# Patient Record
Sex: Female | Born: 1970 | Race: White | Hispanic: No | Marital: Married | Smoking: Current every day smoker
Health system: Southern US, Community
[De-identification: ages and names within clinical notes are randomized; demographics above are authoritative.]

## PROBLEM LIST (undated history)

## (undated) DIAGNOSIS — G039 Meningitis, unspecified: Secondary | ICD-10-CM

## (undated) DIAGNOSIS — I10 Essential (primary) hypertension: Secondary | ICD-10-CM

## (undated) DIAGNOSIS — M1990 Unspecified osteoarthritis, unspecified site: Secondary | ICD-10-CM

## (undated) DIAGNOSIS — K219 Gastro-esophageal reflux disease without esophagitis: Secondary | ICD-10-CM

## (undated) DIAGNOSIS — F419 Anxiety disorder, unspecified: Secondary | ICD-10-CM

## (undated) DIAGNOSIS — M62830 Muscle spasm of back: Secondary | ICD-10-CM

## (undated) DIAGNOSIS — F32A Depression, unspecified: Secondary | ICD-10-CM

## (undated) DIAGNOSIS — T7840XA Allergy, unspecified, initial encounter: Secondary | ICD-10-CM

## (undated) DIAGNOSIS — C801 Malignant (primary) neoplasm, unspecified: Secondary | ICD-10-CM

## (undated) DIAGNOSIS — R569 Unspecified convulsions: Secondary | ICD-10-CM

## (undated) HISTORY — DX: Unspecified osteoarthritis, unspecified site: M19.90

## (undated) HISTORY — DX: Depression, unspecified: F32.A

## (undated) HISTORY — DX: Meningitis, unspecified: G03.9

## (undated) HISTORY — DX: Essential (primary) hypertension: I10

## (undated) HISTORY — PX: OTHER SURGICAL HISTORY: SHX169

## (undated) HISTORY — DX: Malignant (primary) neoplasm, unspecified: C80.1

## (undated) HISTORY — PX: LAPAROSCOPIC HYSTERECTOMY: SHX1926

## (undated) HISTORY — PX: SHUNT REPLACEMENT: SHX5403

## (undated) HISTORY — DX: Anxiety disorder, unspecified: F41.9

## (undated) HISTORY — DX: Muscle spasm of back: M62.830

## (undated) HISTORY — DX: Gastro-esophageal reflux disease without esophagitis: K21.9

## (undated) HISTORY — DX: Allergy, unspecified, initial encounter: T78.40XA

## (undated) HISTORY — DX: Unspecified convulsions: R56.9

## (undated) HISTORY — PX: SHUNT REMOVAL: SHX342

---
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
## 1999-05-04 ENCOUNTER — Encounter: Payer: Self-pay | Admitting: Endocrinology

## 1999-05-04 ENCOUNTER — Encounter: Admission: RE | Admit: 1999-05-04 | Discharge: 1999-05-04 | Payer: Self-pay | Admitting: Endocrinology

## 1999-08-02 ENCOUNTER — Emergency Department (HOSPITAL_COMMUNITY): Admission: EM | Admit: 1999-08-02 | Discharge: 1999-08-02 | Payer: Self-pay | Admitting: Emergency Medicine

## 1999-08-02 ENCOUNTER — Encounter: Payer: Self-pay | Admitting: Emergency Medicine

## 1999-10-01 ENCOUNTER — Encounter: Admission: RE | Admit: 1999-10-01 | Discharge: 1999-10-18 | Payer: Self-pay | Admitting: Orthopedic Surgery

## 2000-07-25 ENCOUNTER — Other Ambulatory Visit: Admission: RE | Admit: 2000-07-25 | Discharge: 2000-07-25 | Payer: Self-pay | Admitting: Obstetrics and Gynecology

## 2000-07-25 ENCOUNTER — Encounter (INDEPENDENT_AMBULATORY_CARE_PROVIDER_SITE_OTHER): Payer: Self-pay

## 2000-09-26 ENCOUNTER — Encounter (INDEPENDENT_AMBULATORY_CARE_PROVIDER_SITE_OTHER): Payer: Self-pay | Admitting: Specialist

## 2000-09-26 ENCOUNTER — Ambulatory Visit (HOSPITAL_COMMUNITY): Admission: RE | Admit: 2000-09-26 | Discharge: 2000-09-26 | Payer: Self-pay | Admitting: Obstetrics and Gynecology

## 2000-12-08 ENCOUNTER — Inpatient Hospital Stay (HOSPITAL_COMMUNITY): Admission: RE | Admit: 2000-12-08 | Discharge: 2000-12-10 | Payer: Self-pay | Admitting: Obstetrics and Gynecology

## 2000-12-08 ENCOUNTER — Encounter (INDEPENDENT_AMBULATORY_CARE_PROVIDER_SITE_OTHER): Payer: Self-pay | Admitting: Specialist

## 2003-02-23 ENCOUNTER — Ambulatory Visit (HOSPITAL_COMMUNITY): Admission: RE | Admit: 2003-02-23 | Discharge: 2003-02-23 | Payer: Self-pay | Admitting: Neurological Surgery

## 2003-08-16 ENCOUNTER — Encounter: Admission: RE | Admit: 2003-08-16 | Discharge: 2003-08-16 | Payer: Self-pay | Admitting: Endocrinology

## 2004-05-11 ENCOUNTER — Encounter: Admission: RE | Admit: 2004-05-11 | Discharge: 2004-05-11 | Payer: Self-pay | Admitting: Family Medicine

## 2007-02-25 ENCOUNTER — Encounter: Admission: RE | Admit: 2007-02-25 | Discharge: 2007-02-25 | Payer: Self-pay | Admitting: Family Medicine

## 2007-07-24 ENCOUNTER — Inpatient Hospital Stay (HOSPITAL_COMMUNITY): Admission: AD | Admit: 2007-07-24 | Discharge: 2007-07-31 | Payer: Self-pay | Admitting: Neurological Surgery

## 2007-07-25 ENCOUNTER — Ambulatory Visit: Payer: Self-pay | Admitting: Infectious Diseases

## 2007-08-11 ENCOUNTER — Emergency Department (HOSPITAL_COMMUNITY): Admission: EM | Admit: 2007-08-11 | Discharge: 2007-08-12 | Payer: Self-pay | Admitting: Emergency Medicine

## 2007-09-17 ENCOUNTER — Inpatient Hospital Stay (HOSPITAL_COMMUNITY): Admission: EM | Admit: 2007-09-17 | Discharge: 2007-09-19 | Payer: Self-pay | Admitting: Emergency Medicine

## 2008-04-29 ENCOUNTER — Encounter: Admission: RE | Admit: 2008-04-29 | Discharge: 2008-04-29 | Payer: Self-pay | Admitting: Family Medicine

## 2009-04-21 IMAGING — CT CT HEAD W/O CM
1 series · 16 of 30 positions shown, 20 images · non-contrast
Comparison: 07/27/2007 and 07/24/2007

CLINICAL DATA: Shunt malfunction.  Follow-up.

CT HEAD WITHOUT CONTRAST
TECHNIQUE: Contiguous axial images were obtained from the base of
the skull through the vertex without contrast.

[Series 2: head routine 4.8 h37s · axial · 0.43mm/px · z∈[-108,+25]mm · 16 of 30 slices shown, 20 images]
[im 2/30  brain]
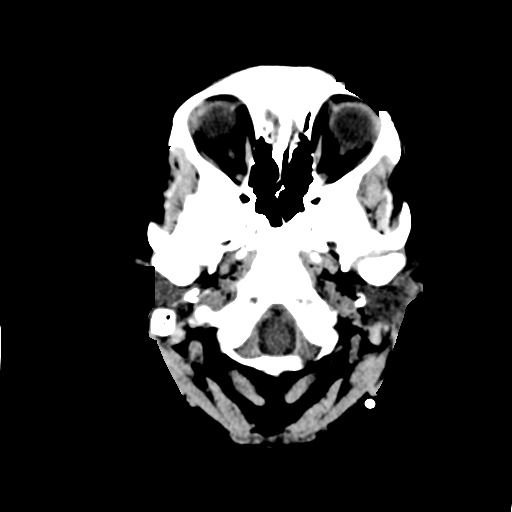
[im 2/30  bone]
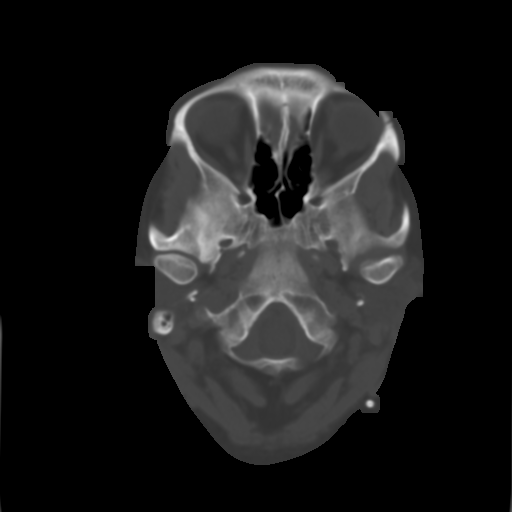
[im 4/30  brain]
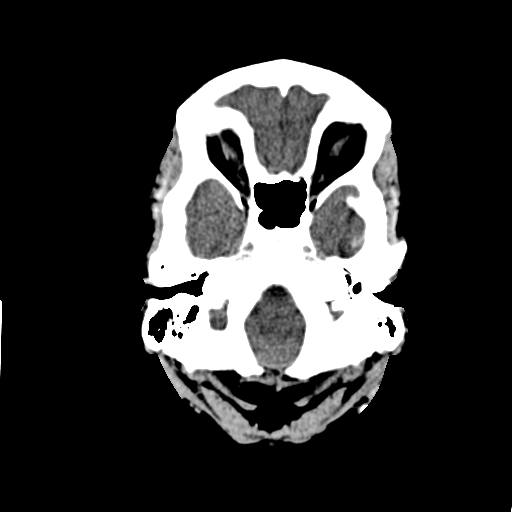
[im 6/30  brain]
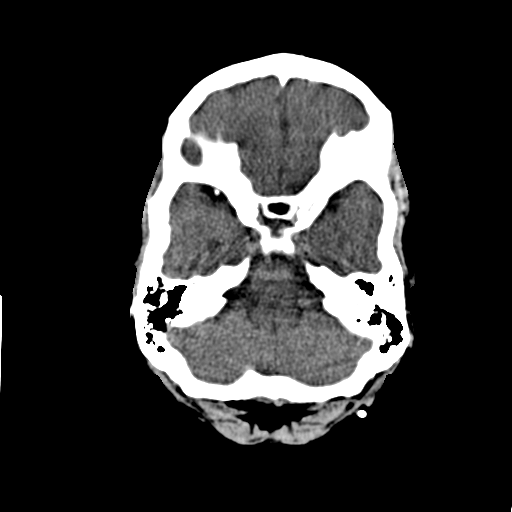
[im 8/30  brain]
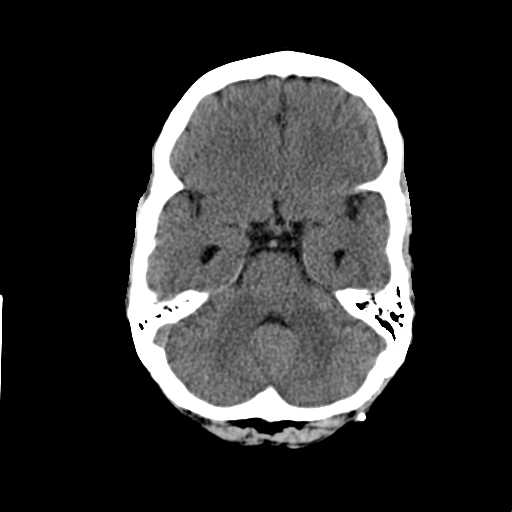
[im 9/30  brain]
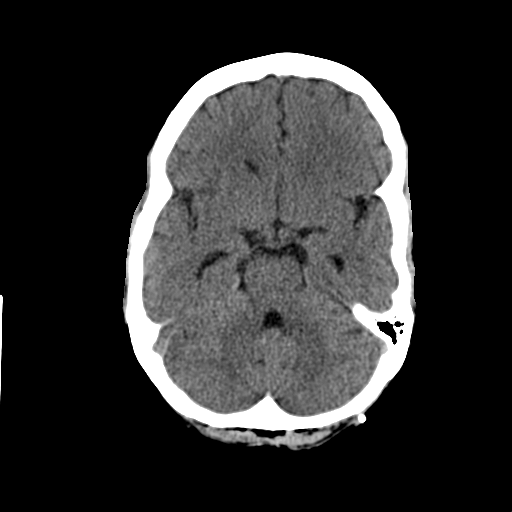
[im 9/30  bone]
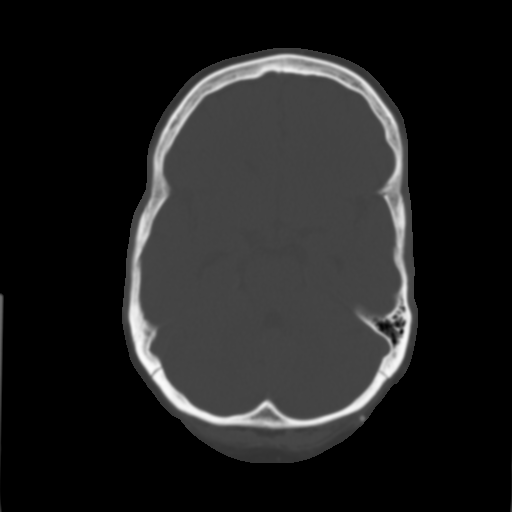
[im 11/30  brain]
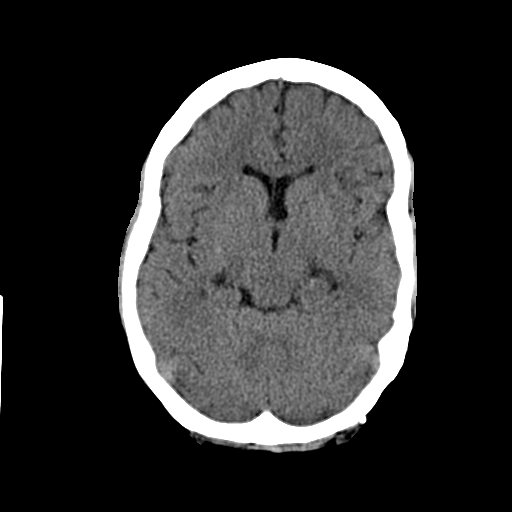
[im 13/30  brain]
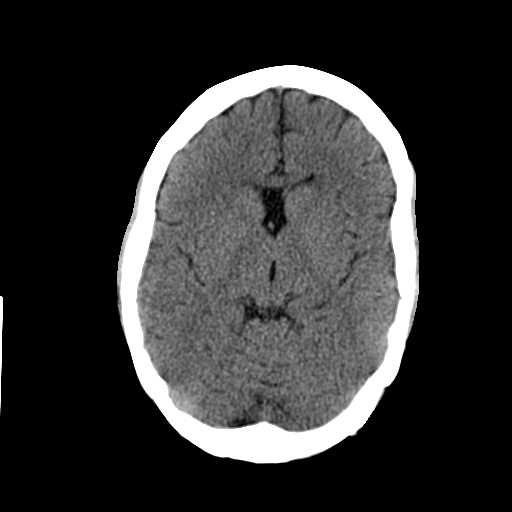
[im 15/30  brain]
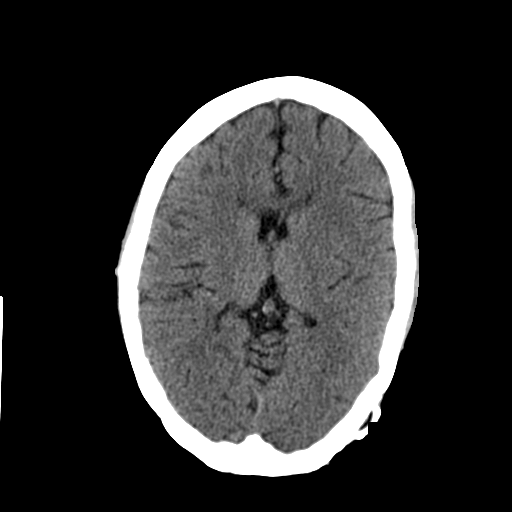
[im 16/30  brain]
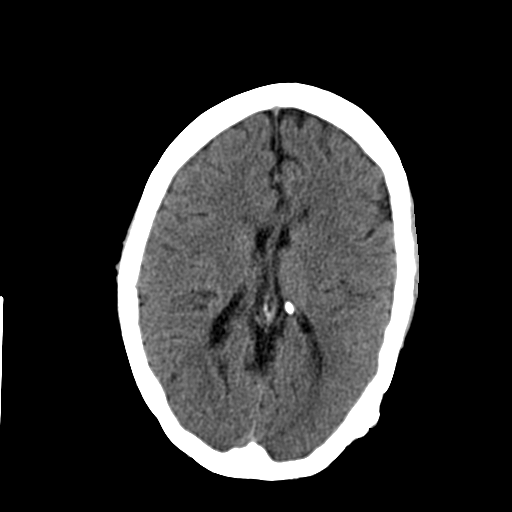
[im 16/30  bone]
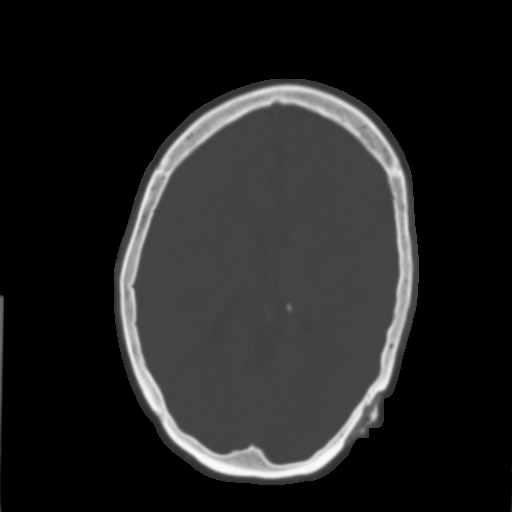
[im 18/30  brain]
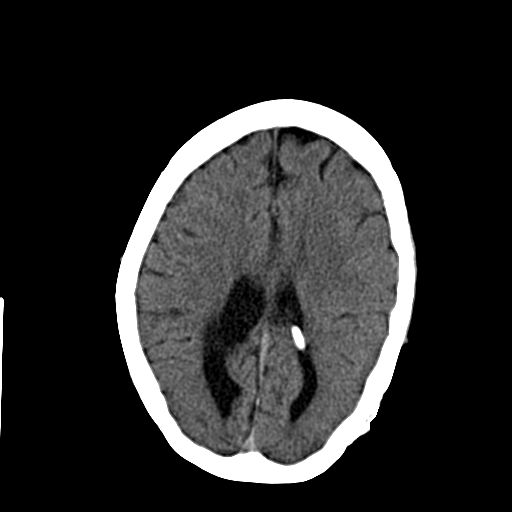
[im 20/30  brain]
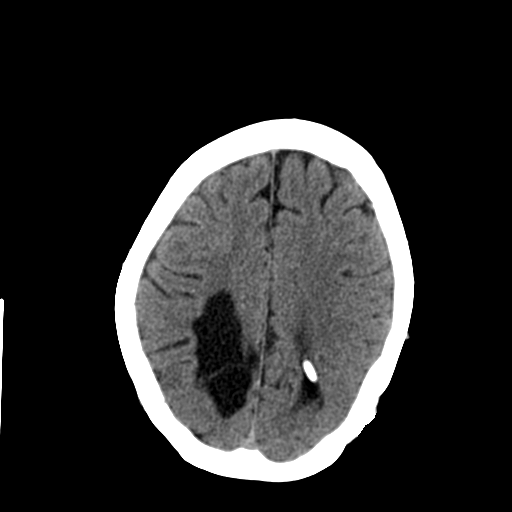
[im 22/30  brain]
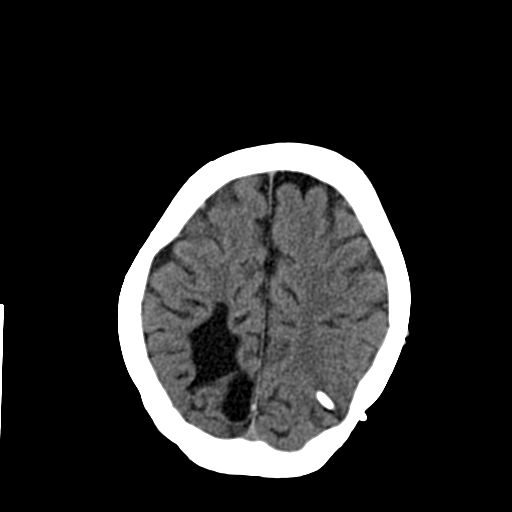
[im 23/30  brain]
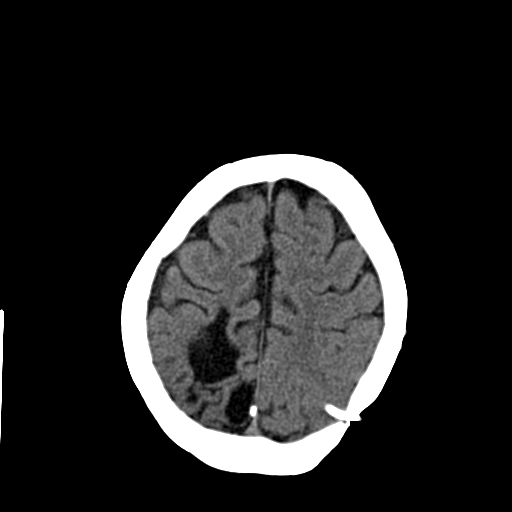
[im 23/30  bone]
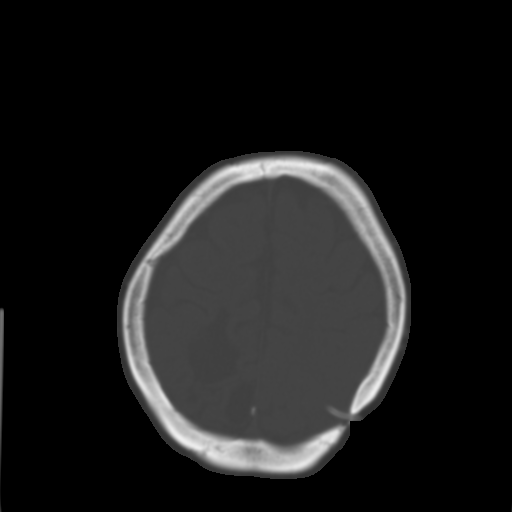
[im 25/30  brain]
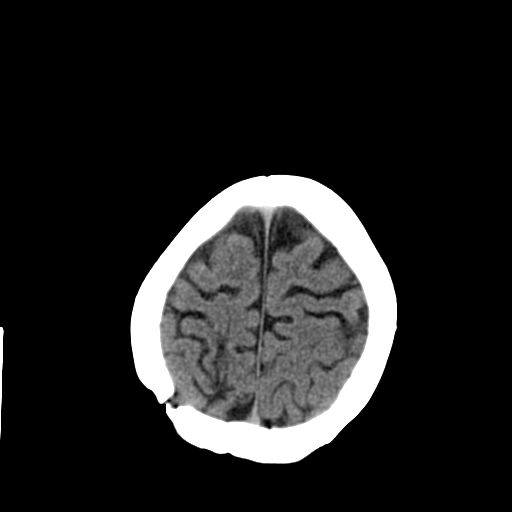
[im 27/30  brain]
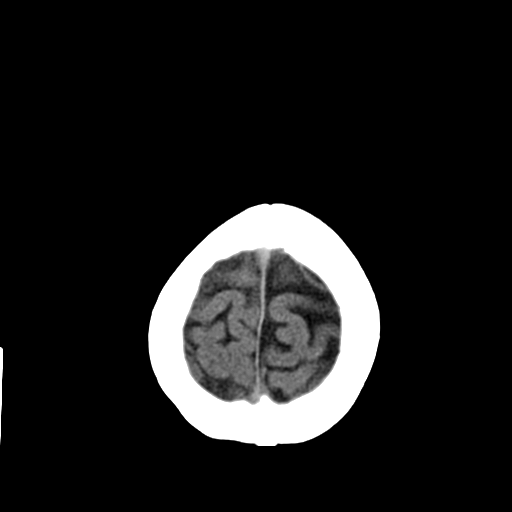
[im 29/30  brain]
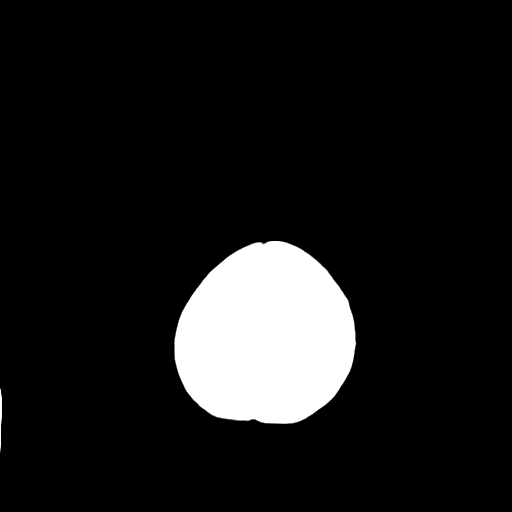

[16 of 30 positions shown; findings below may reference images not displayed]

FINDINGS: The patient has a left parietal shunt catheter.  The
ventricles are stable in size since most recent exam.  Large area
of encephalomalacia is again noted within the right parietal lobe.
There is no intra or extra-axial fluid collection or mass.  No
evidence for intracranial hemorrhage.  No CT evidence for acute
infarction.

Bone windows are otherwise unremarkable.
IMPRESSION: Stable size of lateral ventricles bilaterally.  No evidence for
hemorrhage.

## 2009-04-24 IMAGING — CT CT HEAD W/O CM
1 of 2 series · 13 of 30 positions shown, 17 images · non-contrast
Comparison: 07/28/2007 head CT

CLINICAL DATA: Shunt malfunction, removal

CT HEAD WITHOUT CONTRAST
TECHNIQUE: Contiguous axial images were obtained from the base of
the skull through the vertex without contrast.

[Series 2: brain · axial · 0.47mm/px · z∈[+122,+254]mm · 13 of 32 slices shown, 17 images]
[im 3/32  brain]
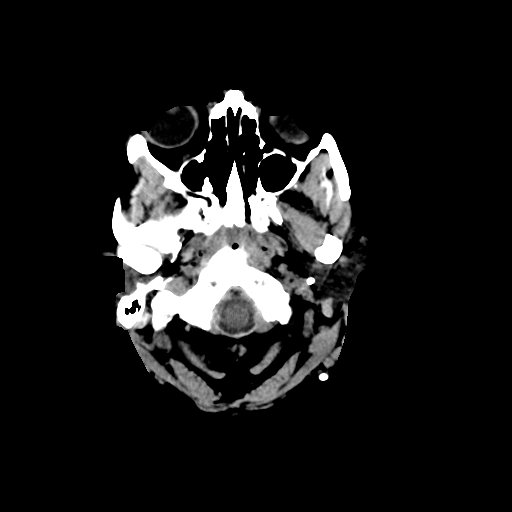
[im 3/32  bone]
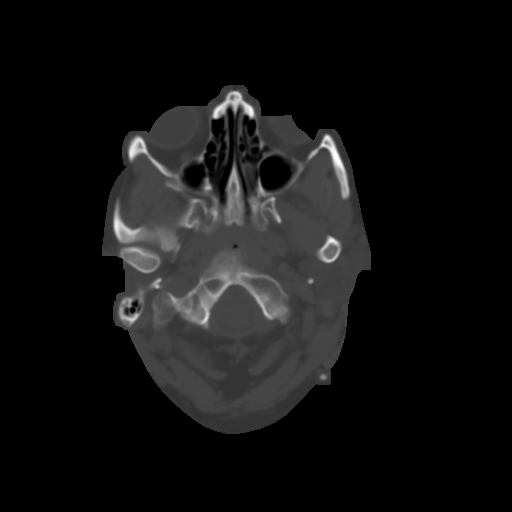
[im 5/32  brain]
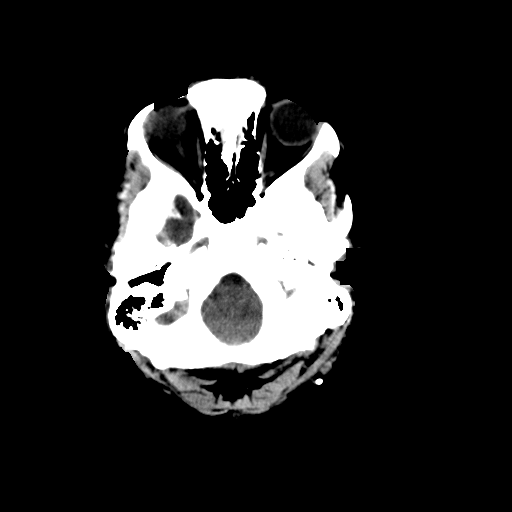
[im 7/32  brain]
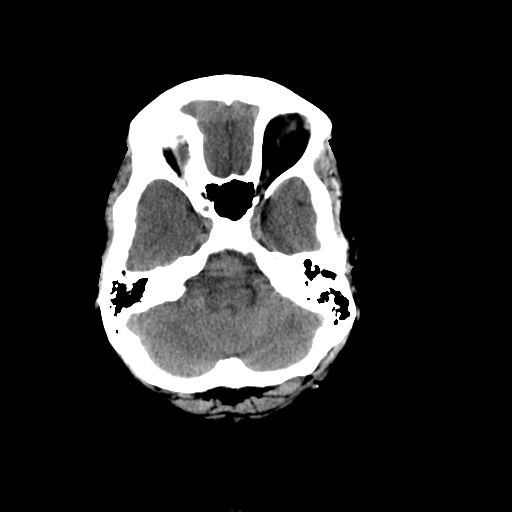
[im 9/32  brain]
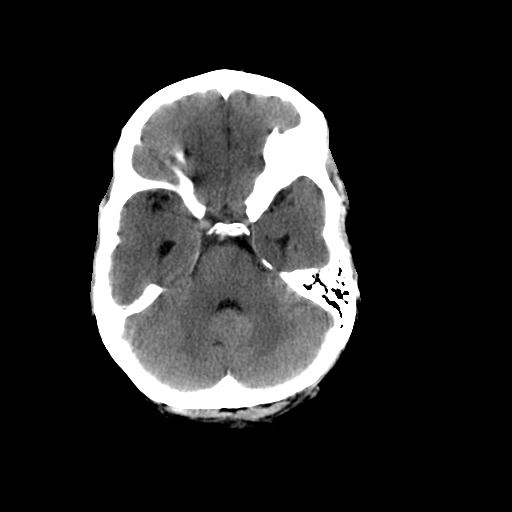
[im 12/32  brain]
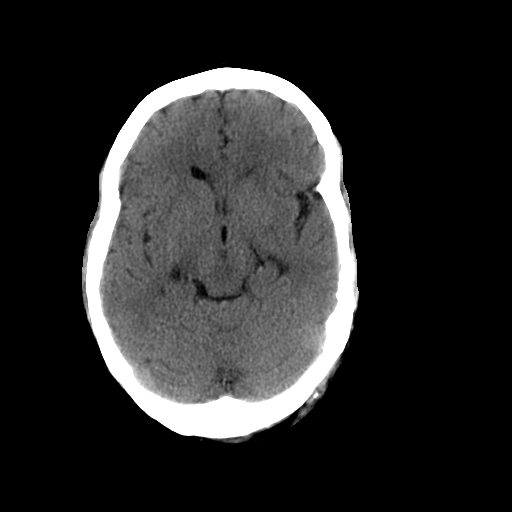
[im 12/32  bone]
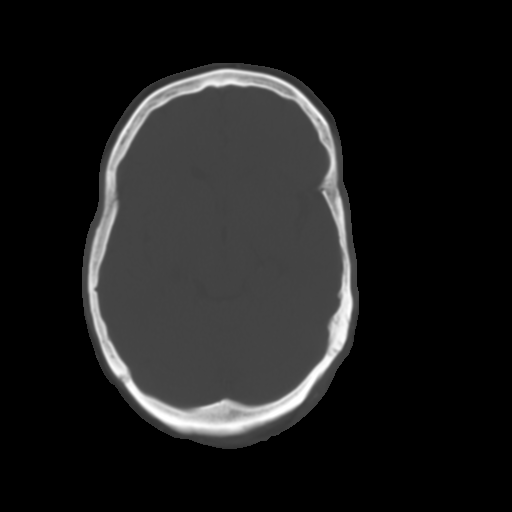
[im 14/32  brain]
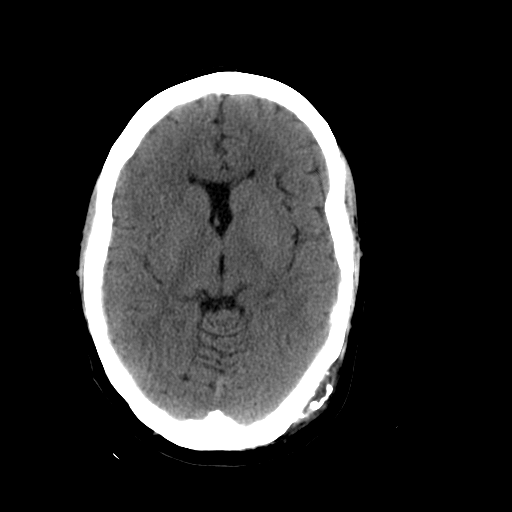
[im 16/32  brain]
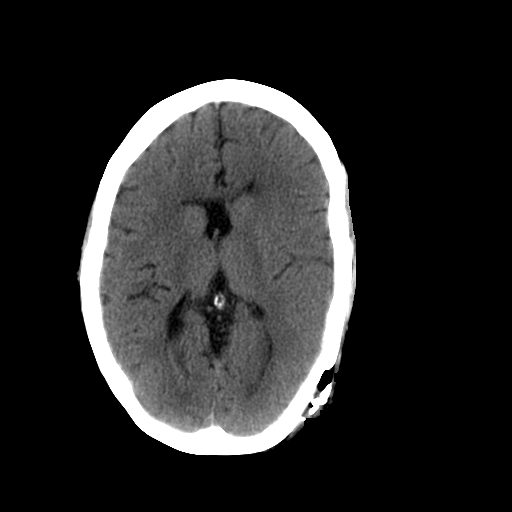
[im 18/32  brain]
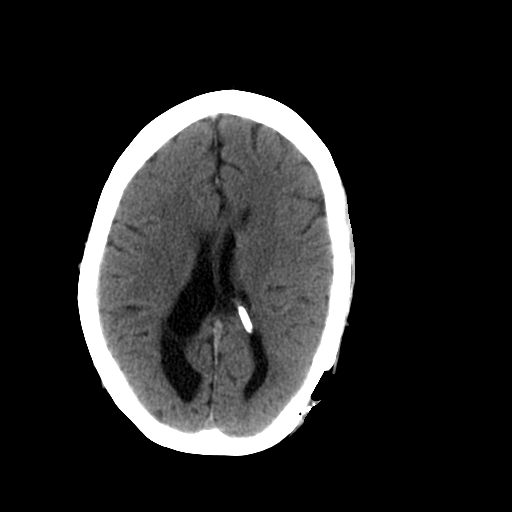
[im 20/32  brain]
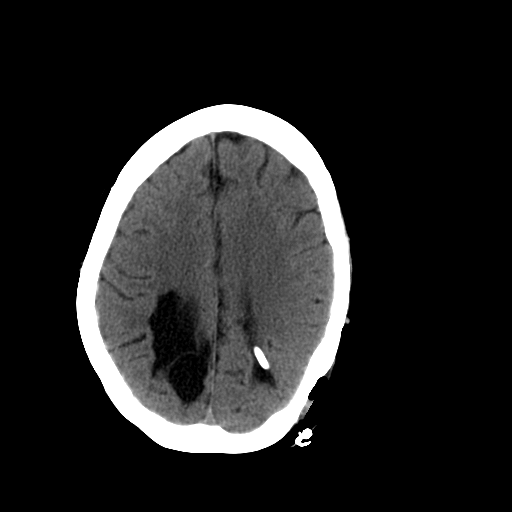
[im 20/32  bone]
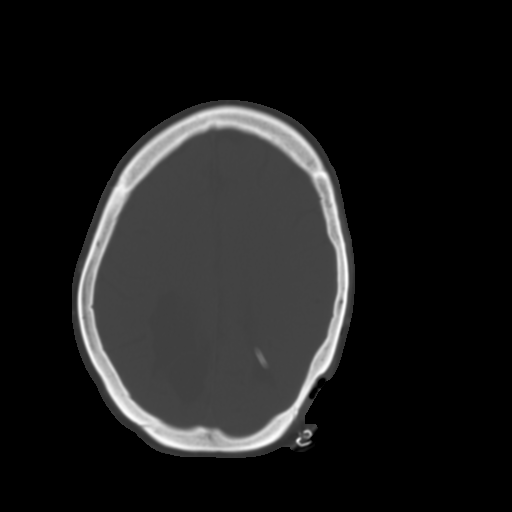
[im 23/32  brain]
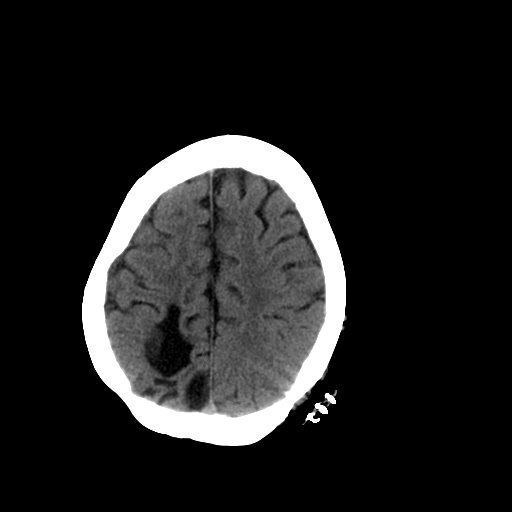
[im 25/32  brain]
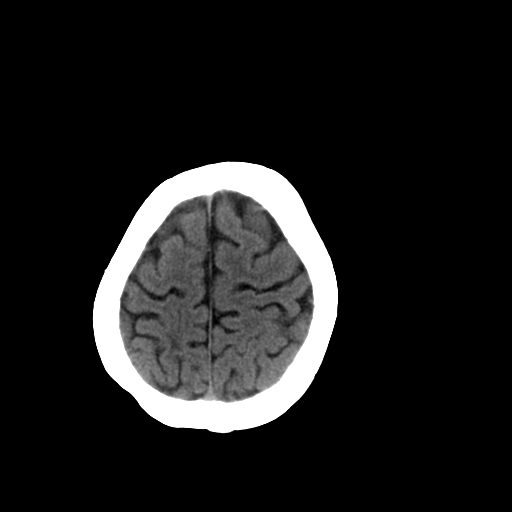
[im 27/32  brain]
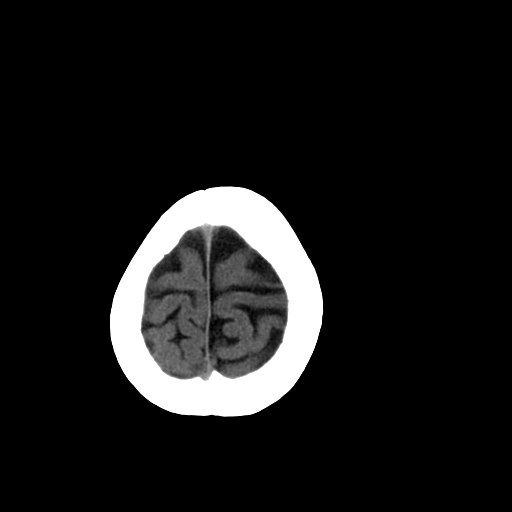
[im 29/32  brain]
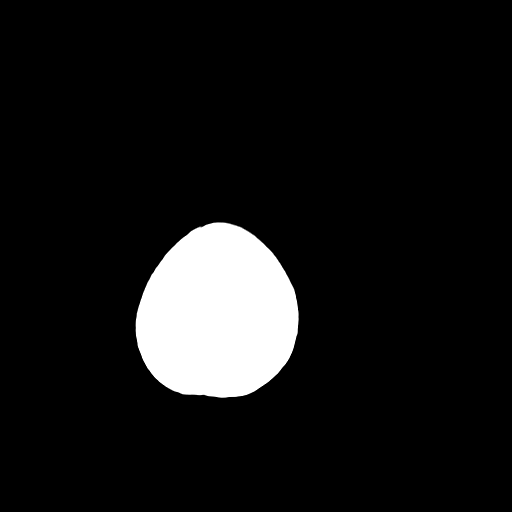
[im 29/32  bone]
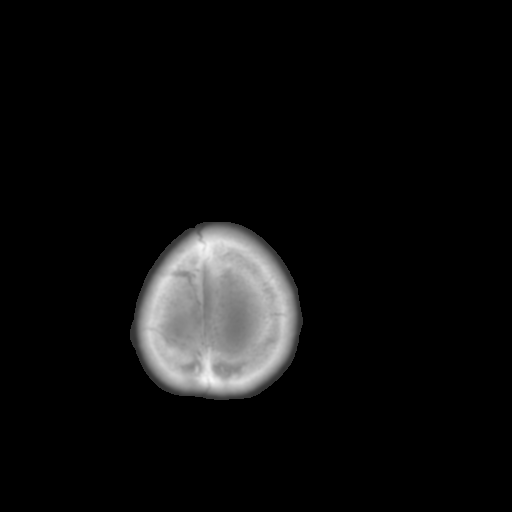

[13 of 30 positions shown; findings below may reference images not displayed]

FINDINGS: A ventricular shunt catheter is visualized via left
parietal occipital approach.  There is stable relative prominence
of the right lateral ventricle with adjacent right temporal
encephalomalacia.  No midline shift.  No acute hemorrhage, acute
infarct, or mass lesion.  The postsurgical changes and staples are
noted over the left parietal occipital skull.  Right parietal
craniotomy defect again noted.
IMPRESSION: No acute intracranial finding after replacement of left parietal
approach shunt catheter.

## 2009-07-31 ENCOUNTER — Emergency Department (HOSPITAL_COMMUNITY): Admission: EM | Admit: 2009-07-31 | Discharge: 2009-07-31 | Payer: Self-pay | Admitting: Emergency Medicine

## 2009-08-09 ENCOUNTER — Ambulatory Visit (HOSPITAL_BASED_OUTPATIENT_CLINIC_OR_DEPARTMENT_OTHER): Admission: RE | Admit: 2009-08-09 | Discharge: 2009-08-09 | Payer: Self-pay | Admitting: Orthopedic Surgery

## 2010-05-28 LAB — POCT HEMOGLOBIN-HEMACUE: Hemoglobin: 14.9 g/dL (ref 12.0–15.0)

## 2010-07-24 NOTE — H&P (Signed)
NAMEANSLEY, MANGIAPANE              ACCOUNT NO.:  [id number]   MEDICAL RECORD NO.:  [id number]          PATIENT TYPE:  INP   LOCATION:  3010                         FACILITY:  [facility]   PHYSICIAN:  Stefani Dama, M.D.  DATE OF BIRTH:  Dec 30, 1970   DATE OF ADMISSION:  07/24/2007  DATE OF DISCHARGE:                              HISTORY & PHYSICAL   ADMISSION DIAGNOSIS:  Shunt malfunction, possible shunt infection.   HISTORY OF PRESENT ILLNESS:  Christy Price is a 40 year old individual  whom I followed for a number of years.  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 Milinda Cave.  She had a revision some 25  years ago by Dr. Darreld Mclean, and has apparently been shunt dependent  since.  The patient has been doing well, functioning at her job at [employer] where she has been pleasant and very diligent in her work.  She has developed a pustule and tenderness over the left  parietooccipital region directly over the shunt.  Her mother notes that  she always seems to be palpating and pumping on the shunt reservoir.  This has become increasingly tender, and she woke her mother last night  to complain about the pain.  She was seen at her mother's place of work  in the ICU this morning and notes that she has a considerable amount of  tenderness in her left parietooccipital region.  She also has been  having increasing back pain, and she has been anorexic and feeling  rather anxious.   PAST MEDICAL HISTORY:  Her past medical history reveals that she has  otherwise been fairly healthy.  She has been able to function fairly  well from behavioral standpoint and signs very readily.   CURRENT MEDICATIONS:  None.   PHYSICAL EXAMINATION:  At the current time, she was alert and oriented.  She has a tender red pustule with a very slight  center of white area of  skin necrosis directly over the shunt.  This measures approximately 2 cm  in the width and is elevated approximately 4-5 mm distal to this.  The  shunt is tender palpation.  Palpation along the shunt in the region of  the neck feels minimal tenderness.  Her level of consciousness is good.  The cranial nerve examination is within limits of normal except for the  fact that she has total hearing loss.  Her abdomen is soft.  Bowel  sounds are positive.  No masses are noted.  Back is tender to palpation.  There is no percussion tenderness.  Her general physical exam reveals  lungs are clear.  The heart has regular rate and rhythm.  No murmurs  noted.  Extremities reveal no cyanosis, clubbing or edema.   IMPRESSION:  At this point, I suspect she has a shunt failure.  I would  advice that this would be revised; however, we need to order some  initial laboratories and CT scan to evaluate this  further.  She has been  afebrile.  We will monitor the temperatures while here in the hospital.   PLAN:  The plan will be to perform a revision depending on the findings  on the scan.  If the patient is shunt dependent, she may need to have  externalization of the shunt first while we treat the wound locally.      Stefani Dama, M.D.  Electronically Signed     HJE/MEDQ  D:  07/24/2007  T:  07/25/2007  Job:  [id number]

## 2010-07-24 NOTE — Discharge Summary (Signed)
Christy Price, Christy Price              ACCOUNT NO.:  [id number]   MEDICAL RECORD NO.:  [id number]          PATIENT TYPE:  INP   LOCATION:  3017                         FACILITY:  [facility]   PHYSICIAN:  Beckey Rutter, MD  DATE OF BIRTH:  1970/05/28   DATE OF ADMISSION:  09/16/2007  DATE OF DISCHARGE:  09/19/2007                               DISCHARGE SUMMARY   NEUROSURGEON:  Payton Doughty, MD.   CHIEF COMPLAINT AND HISTORY OF PRESENT ILLNESS:  This is a very pleasant  40 year old Caucasian female with past medical history significant for  deafness and hydrocephalus, history of meningitis status post VP shunt  removal on May 2009 and replacement.  She presented with abdominal pain;  intermittent, not radiated.   HOSPITAL COURSE:  During the hospital course, the patient was started on  antibiotic Cipro for suspicion of infection.  She also have neurosurgery  evaluated her and the concluded VP shunt is working fine, and this  abdominal pain is unlikely to be secondary to the shunt.   The patient's diet was advanced and currently the patient is able to  tolerate regular diet.  She has a very minimal right quadrant pain  currently and the reason for this abdominal pain could not be determined  beyond that; nevertheless, the patient has ovarian cyst on the left  side.  She has the shunt and the possibility of mild colitis is present,  as well as the possibility of appendicitis.  But again, the CAT scan was  not conclusive and did not indicate any entrapped abdominal acute  pathology.   DISCHARGE MEDICATIONS:  1. Tylenol p.r.n.  2. Cipro 500 mg b.i.d. for 5 more days.   DISCHARGE DIAGNOSES:  1. Abdominal pain, nausea, and vomiting felt secondary to viral versus      ovarian problems.  2. Ventriculoperitoneal hydrocephalus dependent.  3. Deafness.   DISCHARGE PLAN:  The patient was discharged today to follow up with her  GYN.  The patient will be discharged on Cipro, and she was  instructed to  come back to the emergency department if the symptoms recur.  The  discharge plan is also discussed with, [name], her mom, who is a  nurse herself here with [place] in [facility] hospital.      Beckey Rutter, MD  Electronically Signed     EME/MEDQ  D:  09/19/2007  T:  09/20/2007  Job:  [id number]

## 2010-07-24 NOTE — Discharge Summary (Signed)
[name], Price              ACCOUNT NO.:  [id number]   MEDICAL RECORD NO.:  [id number]          PATIENT TYPE:  INP   LOCATION:  3020                         FACILITY:  [facility]   PHYSICIAN:  Stefani Dama, M.D.  DATE OF BIRTH:  1970/05/22   DATE OF ADMISSION:  07/24/2007  DATE OF DISCHARGE:                               DISCHARGE SUMMARY   ADMITTING DIAGNOSES:  1. Ventriculoperitoneal shunt infection.  2. Scalp infection.   DISCHARGE AND FINAL DIAGNOSES:  1. Possible ventriculoperitoneal shunt infection.  2. Scalp infection.   MAJOR OPERATIONS:  1. Externalization of ventriculoperitoneal shunt on Jul 25, 2007.  2. Removal of ventriculoperitoneal shunt on Jul 30, 2007.   HOSPITAL COURSE:  Christy Price is a 40 year old individual who has had  shunt-dependent hydrocephalus since an infant.  At that time, she  suffered a meningitis, developed hydrocephalus, was treated with  ventriculostomy, ultimately ventriculoperitoneal shunting.  It was  originally placed by Dr. Ardean Larsen.  She has had the same shunt in  place since 40 year of age and had 1 extension performed approximately 20  years ago by Dr. Darreld Mclean.  The shunt has continually worked fairly  well, although she was taught to pump the shunt whenever she got a  headache.  CT scan showed that she has slit ventricles.  On the day of  admission, she developed a pustule overlying the shunt pump, and this  was a rather large 1.5-cm pustule with a central necrotic area and the  whitehead.  She had tenderness along the path of the shunt.  She did not  complain of any headache.  It was suspected that she may have an  evolving shunt infection.  She was hospitalized, and on the next day,  she was taken to the operating room where she underwent externalization  of the distal portion of the catheter.  It drained fluid briskly, and  the pustule itself was debrided.  Cultures yielded coagulase-negative  staphylococcus, and  she was started on vancomycin.  She has been  maintained on this antibiotic during the entire week.  She has not ever  had a fever.  With the externalization, we raised the pressure gradually  and she decreased the amount of fluid that she drank, and ultimately,  she could tolerate being clamped.  The shunt remained clamped during the  entirety of the week.  The ventricular size was noted to enlarge  modestly on the first CT scan after clamping and enlarged slightly more  on the next day.  It was felt, however, that she would tolerate clamping  of the shunt and shunt removal; and on Wednesday Jul 29, 2007, she was  taken to the operating room where she underwent removal of the portion  of the shunt distal to the ventricular catheter.  The ventricular  catheter itself was not removed as it had been implanted in this place  for the last 38 years.  The proximal tube was tied off.  Postoperatively, the patient did reasonably well.  She complained of  headache on the second postoperative day.  A repeat CT scan  was  performed which demonstrated that the ventricular size remained normal.  Her headache seemed to resolve spontaneously.  Her incisions have been  clean and dry, and at  this time, she is undergoing discontinuation of the vancomycin and will  be discharged home.  She will be seen in the office for further followup  in approximately a week time for staple removal.   CONDITION ON DISCHARGE:  Stable.      Stefani Dama, M.D.  Electronically Signed     HJE/MEDQ  D:  07/31/2007  T:  08/01/2007  Job:  [id number]

## 2010-07-24 NOTE — Op Note (Signed)
NAMESONDOS, WOLFMAN              ACCOUNT NO.:  [id number]   MEDICAL RECORD NO.:  [id number]          PATIENT TYPE:  INP   LOCATION:  3103                         FACILITY:  [facility]   PHYSICIAN:  Stefani Dama, M.D.  DATE OF BIRTH:  [birth year]   DATE OF PROCEDURE:  07/24/2007  DATE OF DISCHARGE:                               OPERATIVE REPORT   PREOPERATIVE DIAGNOSIS:  Ventriculoperitoneal shunt malfunction, status  post externalization.   POSTOPERATIVE DIAGNOSIS:  Ventriculoperitoneal shunt malfunction, status  post externalization.   PROCEDURES:  Removal of a Ventriculoperitoneal shunt.   SURGEON:  Stefani Dama, MD   ASSISTANT:  Coletta Memos, MD   ANESTHESIA:  General endotracheal.   INDICATIONS:  Christy Price is a 40 year old individual who has had  shunt malfunction, noted with an infection over the proximal portion of  the shunt near the pump.  Her shunt had been externalized and the shunt  is an excess of 42 years old.  Fluid withdrawn from it was clear and did  not have any signs of infection.  The site of the erythema around the  proximal end was a pustule, yielded coag negative staph.  The patient  was placed on antibiotics and after debridement of that area of her  scalp, she defervesced rapidly.  She has not been requiring the shunt to  drain since the time of surgery and it was felt that she is no longer  shunt dependent.  She did have some enlargement of her ventricular size  on the CT scan and she seems to be tolerating this well.  She is taken  to the operating room to undergo removal of the shunt.   PROCEDURE:  The patient was brought to the operating room and was placed  on the table in supine position.  After the smooth induction of general  endotracheal anesthesia, her head was turned to the right side.  Left  parietooccipital region was exposed and the area on the scalp was  identified were previous incision had been made.  This was above and  behind  the shunt, catheter, and tubing.  The area was incised after  infiltrating with 3 mL of lidocaine with epinephrine and then with  dissection down to the plane of the shunt catheter.  The catheter itself  could be identified along with the fluid chambers.  At this point, some  dissection along the shunt catheter was identified in its entrance into  the scalp.  Dissection then yielded the catheter and attempts to pull  the catheter slightly a few millimeters found that there was resistance.  The catheter immediately plunged back into the drain.  It was decided  that was wise to pull the catheter out and the catheter was clamped.  It  was tied off with a 2-0 silk suture and then cut at the distal end.  No  CSF leakage was noted.  The valve mechanism was then able to be  dissected free and ultimately the distal tubing was identified.  Attempts to move the tubing and loosen it from its surrounding  subcutaneous adhesions were performed by  Dr. Franky Macho, manhandling the  distal end of the tubing, which was externalized in the region of the  chest.  He did this with a sterile glove under the operating field.  Attempts to mobilize the catheter were unsuccessful.  Ultimately, an  attempt to pull the catheter proximally yielded breakage of the catheter  approximately 2 inches distal to the end of the shunt, and attempts to  pull the catheter from the distal end by Dr. Franky Macho yielded breakage  approximately an inch and half proximal on the shunt, with this the  procedure was discontinued.  The scalp incision was irrigated copiously  with antibiotic irrigating solution and closed with 2-0 Vicryl in  interrupted fashion  and surgical staples in the skin.  A single surgical staple was placed  in the externalization site on the chest.  Dry sterile dressings were  applied.  The patient tolerated the procedure well and was returned to  recovery room in stable condition.      Stefani Dama, M.D.   Electronically Signed     HJE/MEDQ  D:  07/29/2007  T:  07/30/2007  Job:  [id number]

## 2010-07-24 NOTE — H&P (Signed)
Christy Price, Christy Price              ACCOUNT NO.:  [id number]   MEDICAL RECORD NO.:  [id number]          PATIENT TYPE:  INP   LOCATION:  3017                         FACILITY:  [facility]   PHYSICIAN:  Herbie Saxon, MDDATE OF BIRTH:  09/27/1970   DATE OF ADMISSION:  09/16/2007  DATE OF DISCHARGE:                              HISTORY & PHYSICAL   PRIMARY CARE PHYSICIAN:  Unassigned.   NEUROSURGEON:  Payton Doughty, M.D.   PRESENTING COMPLAINT:  Abdominal pain, nausea, vomiting, and diarrhea 1  day.   HISTORY OF PRESENTING COMPLAINT:  This is a 40 year old Caucasian female  with past medical history of deafness, hydrocephalus, meningitis, status  post VP shunt removal in May 2009.  She was quite well until last time  when she started having episodes of nausea, vomiting and diarrhea.  She  has also been having generalized abdominal pain, was in the right lower  quadrant.  Abdominal pain was 5-6/10, intermittent nonradiated.  There  has been no constipation, no abdominal distention, no jaundice, no  hematemesis, or melena stool.  The patient has noticed occipital  headache with intermittent photophobia, but there is no fever, no  stiffness, no syncopal episode, and no dizziness.  The patient has urine  incontinence, but no dysuria and hematuria.  No urgency of urination.   PAST MEDICAL HISTORY:  As stated above, deafness, hydrocephalus and  meningitis.   PAST SURGERIES:  Hysterectomy, VP shunt x4, last VP shunt was in 1971  and she had removal of the VP  catether in May 2009.   FAMILY HISTORY:  Mother with Crohn's disease, father with testicular  cancer,  diabetes mellitus.   SOCIAL HISTORY:  She is married with no children.  No history of drug,  alcohol, or tobacco abuse.   REVIEW OF SYSTEMS:  Fourteen systems are reviewed, pertinent positives  as the history of presenting complaints.   ALLERGIES:  PERCOCET and RIFAMPIN.   MEDICATIONS AT HOME:  Tylenol.   PHYSICAL  EXAMINATION:  GENERAL:  On examination, she is a young lady,  obese, not in acute respiratory distress.  VITAL SIGNS:  Temperature is 98, pulse is 67, respiratory rate is 20,  and blood pressure 108/70.  HEENT:  Pupils are equal, reactive to light and accommodation.  Mucous  membranes are moist.  There is no pallor jaundice.  Oropharynx and  nasopharynx are clear.  Head is atraumatic and normocephalic.  NECK:  Supple.  There is no elevated JVD.  No neck stiffness.  No  thyromegaly or carotid bruits.  CHEST:  Clinically clear.  HEART:  Heart sounds 1 and 2, regular rate and rhythm.  ABDOMEN:  Soft, mild right lower quadrant tenderness.  There is some  mild epigastric tenderness.  No organomegaly.  Inguinal orifices are  patent.  NEUROLOGIC:  She is alert and oriented x3, communication through a deaf  interpreter.  Peripheral pulses reduced.  Trace pedal edema.  No skin  rash or erythema.   LABORATORY:  WBC 14.9, hematocrit is 43.8, and platelet count 325.  PT  12.9, INR is 1.0, AST 20, ALT 17, total  bilirubin 12.9, and lipase 16.  Urinalysis cloudy, but leukocyte esterase negative.  CT head shows no  acute intracranial abnormality.  Left-sided VP shunt catheter, no  evidence of hydrocephalus.  Stable right parietal lobe encephalomalacia.  CT abdomen and pelvis, no acute findings in the abdomen and it is  normal.  Appendix is normal, ureters are decompressed, no stones.  The  patient is status post hysterectomy with 2.6 cm cystic area within the  left ovary.  There is no gastroenteritis, leukocytosis, rule out  infectious diarrhea.   The patient will be admitted to medical bed and get a stool wbc, ova and  parasite, and culture.  Also obtain a blood culture, check her  Hemoccult, start her on Cipro 500 mg IV q.12 h., Tylenol 60 mg q.4 h.  p.r.n., and Lexapro 5 mg q.12 h. p.r.n. daily.  Check her CBC in the  morning.  She is to be on bedrest, seizure, and fall precautions.  O2  sats to  be monitored, O2 2 L nasal cannula p.r.n. to keep sats greater  than 90.  Clear liquids for now and advance as tolerated; IV fluid  normal saline at 150 mL an hour.  She is to be on Protonix 40 mg IV  daily, Phenergan 25 mg q.8 h. p.r.n., and Lovenox 40 mg subcu daily for  DVT prophylaxis.      Herbie Saxon, MD  Electronically Signed     MIO/MEDQ  D:  09/17/2007  T:  09/17/2007  Job:  [id number]

## 2010-07-24 NOTE — Op Note (Signed)
[name], Price              ACCOUNT NO.:  [id number]   MEDICAL RECORD NO.:  [id number]          PATIENT TYPE:  INP   LOCATION:  3103                         FACILITY:  [facility]   PHYSICIAN:  Stefani Dama, M.D.  DATE OF BIRTH:  1970-09-29   DATE OF PROCEDURE:  07/25/2007  DATE OF DISCHARGE:                               OPERATIVE REPORT   PREOPERATIVE DIAGNOSES:  1. Left occipital scalp infection.  2. Possible shunt infection.   POSTOPERATIVE DIAGNOSES:  1. Left occipital scalp infection.  2. Possible shunt infection.   PROCEDURES:  1. Externalization of ventriculoperitoneal shunt.  2. Debridement of left occipital scalp infection.   SURGEON:  Stefani Dama, MD   FIRST ASSISTANT:  Hewitt Shorts, MD   ANESTHESIA:  General endotracheal.   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  She  has been shunt dependent all these years and her last shunt revision was  some 30+ years ago.  At that time, she underwent placement of a  ventriculoperitoneal shunt and this shunt has been functioning with one  revision of a distal catheter many years ago.  At the current time, the  patient had developed a pustule over the left occipital region and had  developed pain along the tract of the shunt, in addition was feeling  agitated.  She was noted to have a slightly elevated white count of  10.7.  Her CRP was also elevated a 4.6.  This was suspected that it is  developing a shunt infection.   PROCEDURE:  The patient was brought to the operating room, placed on the  table in supine position.  After smooth induction of general  endotracheal anesthesia, her back of her head was shaved around the area  of the pustule and the scalp infection and then the area of the shunt  tract was cleared down to the right upper quadrant.  The area was then  prepped with alcohol and DuraPrep in two  separate stages in the scalp  region and the abdominal region and then draped out sterilely.  A  vertical incision was made through the old scar in the right upper  quadrant and the subcutaneous tissues were dissected down until the  shunt tubing was identified.  The fascia overlying the shunt tubing was  opened and the distal catheter was noted to be just barely in the  peritoneal cavity and was brought out.  This was an older style slitted  catheter with the distal end closed.  The catheter produced clear  cerebrospinal fluid almost immediately on removal and cultures of the  cerebrospinal fluid were obtained.  This evidence of fluid at the distal  catheter indicated that the shunt was still working.  A more proximal  portion, however, would need to be secured in order to connect the shunt  to a collecting system.  A separate incision on the mid chest wall where  she had had a previous connection was then identified and the skin over  this was opened and the  tubing was identified and brought out through  the skin.  Here, the fascial sheath around the tubing was very calcific  and had much calcific debris within it.  The catheter was brought out  distally and the distal end was then cut and the system was then  connected to a collecting system.  Each of the incision was then closed  with 3-0 Vicryl inverted interrupted fashion and the system was secured  to the scalp in this region.  Dry sterile dressings were applied to both  these incisions.  At this point, the side had opened up the region of  the pustule and a small vertical incision was made through the pustule  itself.  The pus was sent for culture and Gram stain.  Then debridement  of the pustule occurred and no further pus was identified.  In the  depths of the wound, there did appear to be a fascial plane separating  this from the shunting system itself.  Once this was debrided, a dry  sterile dressing was placed over after some  irrigation with saline.  The  incision in the pustule was not closed.  With this, the patient was then  returned to recovery room in stable condition.  After the pustule was  opened, the patient was immediately started on vancomycin.  She will be  seen by Infectious Disease for consultation after surgical intervention      Stefani Dama, M.D.  Electronically Signed     HJE/MEDQ  D:  07/25/2007  T:  07/26/2007  Job:  [id number]

## 2010-07-27 NOTE — H&P (Signed)
[facility]  Patient:    Christy Price, Christy Price Visit Number: [id number] MRN: [id number]          Service Type: DSU Location: DAY Attending Physician:  Lendon Colonel Dictated by:   Kathie Rhodes. Kyra Manges, M.D. Admit Date:  09/26/2000 Discharge Date: 09/26/2000                           History and Physical  CHIEF COMPLAINT:              Elective admission.  HISTORY OF PRESENT ILLNESS:   Christy Price is a 40 year old nulligravida female who presents for a vaginal hysterectomy for severe dysplasia with endocervical involvement.  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  She has a ventriculoperitoneal shunt which has not been revised in some 30 years.  She has a history of urethral dilatation. 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  In 1987, she had a tubal ligation and a right salpingo-oophorectomy for adhesions and a large multicystic ovary.  MEDICATIONS:                  NyQuil for allergies, but no other medications. She has in the past been on phenobarbital for seizures.  REVIEW OF SYSTEMS:            HEENT:  She is deaf.  Otherwise, has no history of any difficulty other than that mentioned above.  She notes no frequent headaches.  HEART:  No history of hypertension, no chest pain, no shortness of breath.  LUNGS:  No chronic cough, no hemoptysis.  GENITOURINARY:  She is no stress incontinence or difficulties other than urethral dilatation in 1979. She relates to problems.  GASTROINTESTINAL:  No bowel habit change, no weight loss or gain, no melena.  MUSCLES, BONES, JOINTS:  No fractures or arthritis.  SOCIAL HISTORY:               She does not work or drink.  She does smoke a pack of cigarettes daily.  FAMILY HISTORY:               Her mother is 49, her father is 50 with cancer of the prostate.  Her mother has Crohns disease.  She has one brother and one sister who are living and  well.  PHYSICAL EXAMINATION:  GENERAL:                      Well-developed, nourished female who appears to be somewhat younger than her stated age.  The patient is alert and cooperative.  Interview has been made with a signing interpretor, who is her sister.  VITAL SIGNS:                  Weight is 143.  Blood pressure 120/76.  HEENT:                        Unremarkable.  Oropharynx is not injected.  NECK:                         Supple.  Thyroid is not enlarged.  Carotid pulses are equal, without bruits.  No adenopathy appreciated.  HEART:                        Normal sinus rhythm.  No murmurs.  LUNGS:                        Clear.  BREASTS:                      No masses or tenderness.  Axillary areas are free from adenopathy.  ABDOMEN:                      Soft, flat.  Liver, spleen, and kidneys are not palpated.  Bowel sounds are normal.  There is a transverse incision that is well healed.  No evidence of hernia.  No bruits are heard.  Femoral pulses are equal.  PELVIC:                       Normal vulva and vagina.  The cervix is post-cone, slight bloody discharge.  Uterus is normal size and shape, with no masses.  RECTOVAGINAL:                 Confirms.  EXTREMITIES:                  Good range of motion, equal pulses and reflexes.  IMPRESSION:                   Severe dysplasia.  PLAN:                         Vaginal hysterectomy, possibly abdominal.  The risks and benefits have been discussed with the patient through her signed interpretor. Dictated by:   S. Kyra Manges, M.D. Attending Physician:  Lendon Colonel DD:  12/08/00 TD:  12/08/00 Job: [phone] [id number]

## 2010-07-27 NOTE — Op Note (Signed)
[facility]  Patient:    Christy Price, Christy Price Visit Number: [id number] MRN: [id number]          Service Type: DSU Location: DAY Attending Physician:  Lendon Colonel Dictated by:   Kathie Rhodes. Kyra Manges, M.D. Admit Date:  09/26/2000 Discharge Date: 09/26/2000                             Operative Report  PREOPERATIVE DIAGNOSES:       Severe dysplasia with endocervical involvement.  POSTOPERATIVE DIAGNOSES:      Severe dysplasia with endocervical involvement.  OPERATION:                    Vaginal hysterectomy.  PROCEDURE:                    Patient was placed in the lithotomy position and prepped and draped in the usual fashion.  Cul-de-sac was entered posteriorly. Uterosacral ligaments were skeletonized and ligated with 0 Chromic.  Following this we were able to incise anteriorly and push the bladder off the lower segment.  Uterine arteries were then clamped and ligated.  The peritoneum was then entered anteriorly and the uterine artery, upper broad ligament, and round ligament were clamped and ligated with 0 Chronic suture.  The specimen was removed from the operative field.  One suture was placed in the left side of the broad ligament for hemostasis.  Hemostasis was secure at that point. Uterosacral ligaments were then plicated in the midline with interrupted sutures of 0 Vicryl and then the peritoneum was closed with 2-0 PDS.  The vaginal vault was then closed with 0 Vicryl and closed with a continuation of the peritoneal suture.  This was closed in a vertical plane.  Catheterized urine was about 100 cc of clear urine.  [name] tolerated the procedure well. Dictated by:   S. Kyra Manges, M.D. Attending Physician:  Lendon Colonel DD:  12/08/00 TD:  12/08/00 Job: [id number]

## 2010-07-27 NOTE — Discharge Summary (Signed)
[facility]  Patient:    [name], [name] Visit Number: [id number] MRN: [id number]          Service Type: GYN Location: [id number] Attending Physician:  Lendon Colonel Dictated by:   Kathie Rhodes. Kyra Manges, M.D. Admit Date:  12/08/2000 Discharge Date: 12/10/2000                             Discharge Summary  ADMISSION DIAGNOSIS:          Severe dysplasia of the cervix, status post conization.  DISCHARGE DIAGNOSIS:          Severe dysplasia of the cervix, status post conization.  OPERATION PERFORMED:          Vaginal hysterectomy.  BRIEF HISTORY:                Christy Price is a 40 year old nulligravid female who presented to the hospital for vaginal hysterectomy for severe dysplasia of cervix. She also had menstrual pain.  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  Pathology report revealed severe dysplasia with no evidence of invasion.  HOSPITAL COURSE:              The patient was admitted to the hospital and underwent an uneventful vaginal hysterectomy. Her postoperative course was uncomplicated other than the first morning postoperatively she appeared to be nauseated and we decided to keep her overnight for another 24 hours to be sure this had resolved. It had, and on the day of discharge, she was perfectly well and was told to go home and call for nausea or vomiting. I gave her mother a prescription for Phenergan suppositories and she is to take Percocet for pain.  CONDITION ON DISCHARGE:       Improved. Dictated by:   S. Kyra Manges, M.D. Attending Physician:  Lendon Colonel DD:  12/25/00 TD:  12/28/00 Job: [id number]

## 2010-07-27 NOTE — Op Note (Signed)
[facility]  Patient:    Christy Price, Christy Price                      MRN: [id number] Proc. Date: 09/26/00 Adm. Date:  [id number] Attending:  Lendon Colonel                           Operative Report  PREOPERATIVE DIAGNOSIS:  Severe cervical dysplasia.  POSTOPERATIVE DIAGNOSIS:  Severe cervical dysplasia.  OPERATION:  Cold knife conization.  DESCRIPTION OF PROCEDURE:  The patient was placed in lithotomy position and prepped and draped in the usual fashion.  Cervix was injected with a Xylocaine epinephrine solution, and two lateral sutures were placed in the cervix.  A cold knife conization was obtained.  Two Sturmdorf sutures were placed in the anterior lip of the cervix and posterior lip of the cervix respectively and then figure-of-eight sutures laterally for hemostasis.  An endocervical curettage had been done prior to closing the cervical bed.  [name] tolerated this procedure well and was sent to the recovery room in good condition. DD:  09/26/00 TD:  09/26/00 Job: [id number]

## 2010-10-09 ENCOUNTER — Other Ambulatory Visit: Payer: Self-pay | Admitting: Family Medicine

## 2010-10-09 DIAGNOSIS — Z1231 Encounter for screening mammogram for malignant neoplasm of breast: Secondary | ICD-10-CM

## 2010-10-17 ENCOUNTER — Ambulatory Visit: Payer: Self-pay

## 2010-10-17 ENCOUNTER — Ambulatory Visit
Admission: RE | Admit: 2010-10-17 | Discharge: 2010-10-17 | Disposition: A | Discharge status: Home / Self Care | Payer: Medicare HMO | Source: Ambulatory Visit | Attending: Family Medicine | Admitting: Family Medicine

## 2010-10-22 ENCOUNTER — Other Ambulatory Visit: Payer: Self-pay | Admitting: Family Medicine

## 2010-10-22 DIAGNOSIS — N63 Unspecified lump in unspecified breast: Secondary | ICD-10-CM

## 2010-10-30 ENCOUNTER — Ambulatory Visit
Admission: RE | Admit: 2010-10-30 | Discharge: 2010-10-30 | Disposition: A | Discharge status: Home / Self Care | Payer: Medicare HMO | Source: Ambulatory Visit | Attending: Family Medicine | Admitting: Family Medicine

## 2010-10-30 DIAGNOSIS — N63 Unspecified lump in unspecified breast: Secondary | ICD-10-CM

## 2010-10-30 HISTORY — PX: BREAST BIOPSY: SHX20

## 2010-12-05 LAB — BASIC METABOLIC PANEL
CO2: 26
Calcium: 8.5
Calcium: 8.8
Chloride: 100
Creatinine, Ser: 0.51
Creatinine, Ser: 0.52
GFR calc Af Amer: >60
GFR calc Af Amer: >60
GFR calc non Af Amer: >60
Sodium: 136
Sodium: 139

## 2010-12-05 LAB — CBC
HCT: 39.6
HCT: 42.8
Hemoglobin: 12.4
Hemoglobin: 12.8
Hemoglobin: 14.5
MCHC: 33.2
MCHC: 33.6
MCHC: 33.9
MCHC: 35.1
MCV: 90.9
MCV: 92
Platelets: 273
RBC: 3.84 — ABNORMAL LOW
RBC: 4.17
RBC: 4.71
RDW: 12.9
RDW: 13
WBC: 13.4 — ABNORMAL HIGH
WBC: 14.9 — ABNORMAL HIGH

## 2010-12-05 LAB — WOUND CULTURE: Culture: NO GROWTH

## 2010-12-05 LAB — URINALYSIS, ROUTINE W REFLEX MICROSCOPIC
Bilirubin Urine: NEGATIVE
Glucose, UA: NEGATIVE
Hgb urine dipstick: NEGATIVE
Specific Gravity, Urine: 1.005
Urobilinogen, UA: 0.2

## 2010-12-05 LAB — COMPREHENSIVE METABOLIC PANEL
ALT: 11
CO2: 26
Calcium: 8.9
Creatinine, Ser: 0.52
GFR calc non Af Amer: >60
Glucose, Bld: 84

## 2010-12-05 LAB — ANAEROBIC CULTURE: Gram Stain: NONE SEEN

## 2010-12-05 LAB — PROTIME-INR
INR: 0.9
Prothrombin Time: 12.7

## 2010-12-05 LAB — GRAM STAIN

## 2010-12-05 LAB — DIFFERENTIAL
Basophils Absolute: 0.1
Basophils Relative: 0
Eosinophils Absolute: 0.2
Eosinophils Relative: 2
Neutrophils Relative %: 68

## 2010-12-05 LAB — HIGH SENSITIVITY CRP: CRP, High Sensitivity: 4.9 — ABNORMAL HIGH

## 2010-12-05 LAB — VANCOMYCIN, TROUGH
Vancomycin Tr: 10.3
Vancomycin Tr: 11.9

## 2010-12-06 LAB — CULTURE, BLOOD (ROUTINE X 2)

## 2010-12-06 LAB — CSF CELL COUNT WITH DIFFERENTIAL
Lymphs, CSF: 13 — ABNORMAL LOW
Lymphs, CSF: 6 — ABNORMAL LOW
Monocyte-Macrophage-Spinal Fluid: 1 — ABNORMAL LOW
Monocyte-Macrophage-Spinal Fluid: 2 — ABNORMAL LOW
RBC Count, CSF: 23450 — ABNORMAL HIGH
Segmented Neutrophils-CSF: 85 — ABNORMAL HIGH
Segmented Neutrophils-CSF: 93 — ABNORMAL HIGH
Tube #: 4
WBC, CSF: 56 — ABNORMAL HIGH

## 2010-12-06 LAB — URINALYSIS, ROUTINE W REFLEX MICROSCOPIC
Bilirubin Urine: NEGATIVE
Bilirubin Urine: NEGATIVE
Glucose, UA: NEGATIVE
Hgb urine dipstick: NEGATIVE
Hgb urine dipstick: NEGATIVE
Nitrite: NEGATIVE
Protein, ur: NEGATIVE
Protein, ur: NEGATIVE
Specific Gravity, Urine: 1.003 — ABNORMAL LOW
Urobilinogen, UA: 0.2
Urobilinogen, UA: 1

## 2010-12-06 LAB — POCT PREGNANCY, URINE
Operator id: 272551
Preg Test, Ur: NEGATIVE

## 2010-12-06 LAB — CBC
HCT: 41.1
HCT: 39.8
HCT: 43.8
Hemoglobin: 14.2
Hemoglobin: 12.1
Hemoglobin: 12.3
Hemoglobin: 13.5
MCHC: 33.3
MCV: 91.1
MCV: 91.3
MCV: 92.4
MCV: 92.8
Platelets: 319
Platelets: 325
RBC: 3.82 — ABNORMAL LOW
RBC: 3.96
RBC: 4.36
WBC: 19.2 — ABNORMAL HIGH
WBC: 11.6 — ABNORMAL HIGH
WBC: 11.9 — ABNORMAL HIGH
WBC: 14.6 — ABNORMAL HIGH
WBC: 14.9 — ABNORMAL HIGH

## 2010-12-06 LAB — HEPATIC FUNCTION PANEL
Alkaline Phosphatase: 79
Indirect Bilirubin: 0.7
Total Bilirubin: 0.9
Total Protein: 7.2

## 2010-12-06 LAB — DIFFERENTIAL
Basophils Relative: 0
Basophils Relative: 0
Eosinophils Absolute: 0
Eosinophils Relative: 0
Lymphocytes Relative: 36
Lymphs Abs: 2.7
Lymphs Abs: 2
Lymphs Abs: 4.2 — ABNORMAL HIGH
Monocytes Absolute: 1.1 — ABNORMAL HIGH
Monocytes Relative: 4
Monocytes Relative: 10
Monocytes Relative: 4
Neutro Abs: 15.5 — ABNORMAL HIGH
Neutro Abs: 6.2
Neutrophils Relative %: 81 — ABNORMAL HIGH
Neutrophils Relative %: 52

## 2010-12-06 LAB — POCT I-STAT, CHEM 8
Calcium, Ion: 1.14
HCT: 45
Hemoglobin: 15.3 — ABNORMAL HIGH
Sodium: 139
TCO2: 26

## 2010-12-06 LAB — GRAM STAIN

## 2010-12-06 LAB — COMPREHENSIVE METABOLIC PANEL
BUN: 8
CO2: 22
Calcium: 9.1
GFR calc non Af Amer: >60
Glucose, Bld: 86
Total Protein: 6.6

## 2010-12-06 LAB — BASIC METABOLIC PANEL
CO2: 28
Calcium: 8.4
Chloride: 107
Creatinine, Ser: 0.69
Creatinine, Ser: 0.71
GFR calc Af Amer: >60
GFR calc Af Amer: >60
Potassium: 3.4 — ABNORMAL LOW
Sodium: 136
Sodium: 139

## 2010-12-06 LAB — FECAL LACTOFERRIN, QUANT

## 2010-12-06 LAB — CSF CULTURE W GRAM STAIN: Culture: NO GROWTH

## 2010-12-06 LAB — GIARDIA/CRYPTOSPORIDIUM SCREEN(EIA): Giardia Screen - EIA: NEGATIVE

## 2010-12-06 LAB — STOOL CULTURE

## 2010-12-06 LAB — OCCULT BLOOD X 1 CARD TO LAB, STOOL: Fecal Occult Bld: NEGATIVE

## 2010-12-06 LAB — LIPASE, BLOOD
Lipase: 17
Lipase: 16

## 2010-12-06 LAB — PROTEIN AND GLUCOSE, CSF: Glucose, CSF: 56

## 2010-12-06 LAB — PROTIME-INR: Prothrombin Time: 12.9

## 2011-12-05 ENCOUNTER — Other Ambulatory Visit: Payer: Self-pay | Admitting: Family Medicine

## 2011-12-05 DIAGNOSIS — Z1231 Encounter for screening mammogram for malignant neoplasm of breast: Secondary | ICD-10-CM

## 2011-12-24 ENCOUNTER — Ambulatory Visit: Payer: Medicare HMO

## 2012-08-27 ENCOUNTER — Ambulatory Visit: Payer: Medicare HMO

## 2012-09-22 ENCOUNTER — Ambulatory Visit
Admission: RE | Admit: 2012-09-22 | Discharge: 2012-09-22 | Disposition: A | Discharge status: Home / Self Care | Payer: Medicare PPO | Source: Ambulatory Visit | Attending: Family Medicine | Admitting: Family Medicine

## 2012-09-22 DIAGNOSIS — Z1231 Encounter for screening mammogram for malignant neoplasm of breast: Secondary | ICD-10-CM

## 2012-09-24 ENCOUNTER — Other Ambulatory Visit: Payer: Self-pay | Admitting: Family Medicine

## 2012-09-24 DIAGNOSIS — R928 Other abnormal and inconclusive findings on diagnostic imaging of breast: Secondary | ICD-10-CM

## 2012-10-05 ENCOUNTER — Ambulatory Visit
Admission: RE | Admit: 2012-10-05 | Discharge: 2012-10-05 | Disposition: A | Discharge status: Home / Self Care | Payer: Medicare PPO | Source: Ambulatory Visit | Attending: Family Medicine | Admitting: Family Medicine

## 2012-10-05 DIAGNOSIS — R928 Other abnormal and inconclusive findings on diagnostic imaging of breast: Secondary | ICD-10-CM

## 2013-09-17 ENCOUNTER — Other Ambulatory Visit: Payer: Self-pay

## 2013-09-17 DIAGNOSIS — Z1231 Encounter for screening mammogram for malignant neoplasm of breast: Secondary | ICD-10-CM

## 2013-09-28 ENCOUNTER — Encounter (INDEPENDENT_AMBULATORY_CARE_PROVIDER_SITE_OTHER): Payer: Self-pay

## 2013-09-28 ENCOUNTER — Ambulatory Visit
Admission: RE | Admit: 2013-09-28 | Discharge: 2013-09-28 | Disposition: A | Discharge status: Home / Self Care | Payer: Commercial Managed Care - HMO | Source: Ambulatory Visit

## 2013-09-28 DIAGNOSIS — Z1231 Encounter for screening mammogram for malignant neoplasm of breast: Secondary | ICD-10-CM

## 2013-10-12 ENCOUNTER — Other Ambulatory Visit: Payer: Self-pay

## 2013-10-12 ENCOUNTER — Ambulatory Visit
Admission: RE | Admit: 2013-10-12 | Discharge: 2013-10-12 | Disposition: A | Discharge status: Home / Self Care | Payer: Commercial Managed Care - HMO | Source: Ambulatory Visit

## 2013-10-12 DIAGNOSIS — Z1231 Encounter for screening mammogram for malignant neoplasm of breast: Secondary | ICD-10-CM

## 2014-11-02 ENCOUNTER — Encounter: Payer: Self-pay | Admitting: Family Medicine

## 2014-11-02 ENCOUNTER — Ambulatory Visit (INDEPENDENT_AMBULATORY_CARE_PROVIDER_SITE_OTHER): Payer: Commercial Managed Care - HMO | Admitting: Family Medicine

## 2014-11-02 VITALS — BP 132/90 | HR 79 | Temp 97.8°F | Resp 16 | Ht <= 58 in | Wt 158.0 lb

## 2014-11-02 DIAGNOSIS — R600 Localized edema: Secondary | ICD-10-CM | POA: Diagnosis not present

## 2014-11-02 DIAGNOSIS — Z0000 Encounter for general adult medical examination without abnormal findings: Secondary | ICD-10-CM | POA: Diagnosis not present

## 2014-11-02 DIAGNOSIS — A6000 Herpesviral infection of urogenital system, unspecified: Secondary | ICD-10-CM

## 2014-11-02 DIAGNOSIS — F329 Major depressive disorder, single episode, unspecified: Secondary | ICD-10-CM

## 2014-11-02 DIAGNOSIS — F32A Depression, unspecified: Secondary | ICD-10-CM

## 2014-11-02 DIAGNOSIS — M545 Low back pain: Secondary | ICD-10-CM

## 2014-11-02 LAB — LIPID PANEL
CHOL/HDL RATIO: 3.2 ratio (ref <=5.0)
Cholesterol: 147 mg/dL (ref 125–200)
HDL: 46 mg/dL (ref >=46)
LDL Cholesterol: 82 mg/dL (ref <130)
TRIGLYCERIDES: 96 mg/dL (ref <150)
VLDL: 19 mg/dL (ref <30)

## 2014-11-02 LAB — CBC WITH DIFFERENTIAL/PLATELET
BASOS ABS: 0.1 10*3/uL (ref 0.0–0.1)
Basophils Relative: 1 % (ref 0–1)
EOS ABS: 0.1 10*3/uL (ref 0.0–0.7)
EOS PCT: 1 % (ref 0–5)
HEMATOCRIT: 45.4 % (ref 36.0–46.0)
Hemoglobin: 15.9 g/dL — ABNORMAL HIGH (ref 12.0–15.0)
LYMPHS PCT: 18 % (ref 12–46)
Lymphs Abs: 2.2 10*3/uL (ref 0.7–4.0)
MCH: 32.1 pg (ref 26.0–34.0)
MCHC: 35 g/dL (ref 30.0–36.0)
MCV: 91.7 fL (ref 78.0–100.0)
MPV: 11.1 fL (ref 8.6–12.4)
Monocytes Absolute: 1.1 10*3/uL — ABNORMAL HIGH (ref 0.1–1.0)
Monocytes Relative: 9 % (ref 3–12)
NEUTROS PCT: 71 % (ref 43–77)
Neutro Abs: 8.5 10*3/uL — ABNORMAL HIGH (ref 1.7–7.7)
PLATELETS: 316 10*3/uL (ref 150–400)
RBC: 4.95 MIL/uL (ref 3.87–5.11)
RDW: 13.4 % (ref 11.5–15.5)
WBC: 12 10*3/uL — AB (ref 4.0–10.5)

## 2014-11-02 LAB — COMPLETE METABOLIC PANEL WITH GFR
ALT: 8 U/L (ref 6–29)
AST: 13 U/L (ref 10–35)
Albumin: 3.9 g/dL (ref 3.6–5.1)
Alkaline Phosphatase: 81 U/L (ref 33–115)
BUN: 15 mg/dL (ref 7–25)
CALCIUM: 9.2 mg/dL (ref 8.6–10.2)
CHLORIDE: 105 mmol/L (ref 98–110)
CO2: 24 mmol/L (ref 20–31)
CREATININE: 0.55 mg/dL (ref 0.50–1.10)
Glucose, Bld: 75 mg/dL (ref 65–99)
POTASSIUM: 4.3 mmol/L (ref 3.5–5.3)
Sodium: 135 mmol/L (ref 135–146)
Total Bilirubin: 0.3 mg/dL (ref 0.2–1.2)
Total Protein: 6.6 g/dL (ref 6.1–8.1)

## 2014-11-02 LAB — TSH: TSH: 1.331 u[IU]/mL (ref 0.350–4.500)

## 2014-11-02 MED ORDER — ACYCLOVIR 200 MG PO CAPS: 200.0000 mg | ORAL_CAPSULE | Freq: Every day | ORAL | Status: DC

## 2014-11-02 MED ORDER — CITALOPRAM HYDROBROMIDE 20 MG PO TABS: 20.0000 mg | ORAL_TABLET | Freq: Every day | ORAL | Status: DC

## 2014-11-02 MED ORDER — NAPROXEN 500 MG PO TABS: 500.0000 mg | ORAL_TABLET | Freq: Two times a day (BID) | ORAL | Status: DC

## 2014-11-02 MED ORDER — HYDROCHLOROTHIAZIDE 25 MG PO TABS: 25.0000 mg | ORAL_TABLET | Freq: Every day | ORAL | Status: DC

## 2014-11-02 NOTE — Patient Instructions (Signed)
We are starting Celexa 20mg  daily for depression. Take one a day and return in 2 weeks to see how you are doing.  I am starting hydrocholorithiazide 25 mg for hypertension and fluid in lower legs Take naproxen with food 2 times a day with food and we will recheck when you return in 2 weeks. I have prescribed Zovirax to be used for a herpes outbreak Back in two weeks.
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
## 2014-11-03 ENCOUNTER — Telehealth: Payer: Self-pay

## 2014-11-03 ENCOUNTER — Other Ambulatory Visit: Payer: Self-pay | Admitting: Family Medicine

## 2014-11-03 MED ORDER — POTASSIUM CHLORIDE ER 10 MEQ PO TBCR: 10.0000 meq | EXTENDED_RELEASE_TABLET | Freq: Every day | ORAL | Status: DC

## 2014-11-03 NOTE — Telephone Encounter (Signed)
Spoke with Mother since patient is deaf, advised her of low potassium and to start supplement as prescribed. Mother states understanding and will advised daughter of results and instructions. Thanks!

## 2014-11-03 NOTE — Telephone Encounter (Signed)
-----   Message from Micheline Chapman, NP sent at 11/03/2014  8:06 AM EDT ----- Potassium is slightly low. Since we are putting you on a fluid pill, we need to add a low dose of potassium supplement. Cholesterol is fine. Other labs are acceptable.

## 2014-11-06 ENCOUNTER — Encounter: Payer: Self-pay | Admitting: Family Medicine

## 2014-11-06 DIAGNOSIS — F329 Major depressive disorder, single episode, unspecified: Secondary | ICD-10-CM | POA: Insufficient documentation

## 2014-11-06 DIAGNOSIS — F32A Depression, unspecified: Secondary | ICD-10-CM | POA: Insufficient documentation

## 2014-11-06 DIAGNOSIS — M549 Dorsalgia, unspecified: Secondary | ICD-10-CM | POA: Insufficient documentation

## 2014-11-06 DIAGNOSIS — R600 Localized edema: Secondary | ICD-10-CM | POA: Insufficient documentation

## 2014-11-06 DIAGNOSIS — A6000 Herpesviral infection of urogenital system, unspecified: Secondary | ICD-10-CM | POA: Insufficient documentation

## 2014-11-17 ENCOUNTER — Ambulatory Visit (INDEPENDENT_AMBULATORY_CARE_PROVIDER_SITE_OTHER): Payer: Commercial Managed Care - HMO | Admitting: Family Medicine

## 2014-11-17 VITALS — BP 146/89 | HR 81 | Temp 98.4°F | Resp 98 | Ht <= 58 in | Wt 156.0 lb

## 2014-11-17 DIAGNOSIS — K219 Gastro-esophageal reflux disease without esophagitis: Secondary | ICD-10-CM | POA: Diagnosis not present

## 2014-11-17 DIAGNOSIS — F32A Depression, unspecified: Secondary | ICD-10-CM

## 2014-11-17 DIAGNOSIS — F329 Major depressive disorder, single episode, unspecified: Secondary | ICD-10-CM | POA: Diagnosis not present

## 2014-11-17 MED ORDER — ACYCLOVIR 5 % EX OINT: 1.0000 "application " | TOPICAL_OINTMENT | CUTANEOUS | Status: DC

## 2014-11-17 MED ORDER — CITALOPRAM HYDROBROMIDE 20 MG PO TABS: 20.0000 mg | ORAL_TABLET | Freq: Every day | ORAL | Status: DC

## 2014-11-17 MED ORDER — OMEPRAZOLE 20 MG PO CPDR: 20.0000 mg | DELAYED_RELEASE_CAPSULE | Freq: Every day | ORAL | Status: DC

## 2014-11-17 NOTE — Patient Instructions (Signed)
Come back in one month if things are going well, sooner if you need to. Take the omeprazole 20 mg for heartburn.

## 2014-11-17 NOTE — Progress Notes (Signed)
Patient ID: Christy Price, female   DOB: Oct 23, 1970, 44 y.o.   MRN: [id number]   Christy Price, is a 44 y.o. female  [id number]  [id number]  DOB - 1970/06/08  CC:  Chief Complaint  Patient presents with  . Hypertension    2 week follow up/ knee pain        HPI: [name] is a 44 y.o. female here for follow-up depression. She also reports frequent heartburn. She does continue to have some right knee pain, which she reports is better. 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 She report she is starting to feel better and is experiencing no troublesome side effects. She would also like a refill of Zovirax ointment for use with occassional outbreak of genital herpes.  Allergies  Allergen Reactions  . Oxycodone   . Rifampin    Past Medical History  Diagnosis Date  . Meningitis    Current Outpatient Prescriptions on File Prior to Visit  Medication Sig Dispense Refill  . acyclovir (ZOVIRAX) 200 MG capsule Take 1 capsule (200 mg total) by mouth 5 (five) times daily. 25 capsule 1  . hydrochlorothiazide (HYDRODIURIL) 25 MG tablet Take 1 tablet (25 mg total) by mouth daily. 90 tablet 3  . nabumetone (RELAFEN) 750 MG tablet Take 750 mg by mouth 2 (two) times daily as needed.    . naproxen (NAPROSYN) 500 MG tablet Take 1 tablet (500 mg total) by mouth 2 (two) times daily with a meal. 30 tablet 0  . potassium chloride (K-DUR) 10 MEQ tablet Take 1 tablet (10 mEq total) by mouth daily. 30 tablet 0  . promethazine (PHENERGAN) 12.5 MG tablet Take 12.5 mg by mouth every 6 (six) hours as needed for nausea or vomiting.     No current facility-administered medications on file prior to visit.   No family history on file. Social History   Social History  . Marital Status: Married    Spouse Name: N/A  . Number of Children: N/A  . Years of Education: N/A   Occupational History  . Not on file.   Social History Main Topics  .  Smoking status: Current Every Day Smoker -- 1.00 packs/day    Types: Cigarettes  . Smokeless tobacco: Not on file  . Alcohol Use: Not on file  . Drug Use: Not on file  . Sexual Activity: Not on file   Other Topics Concern  . Not on file   Social History Narrative    Review of Systems: Constitutional: Negative for fever, chills, appetite change, weight loss,  Fatigue. Skin: Negative for rashes or lesions of concern. HENT: Negative for ear pain, ear discharge.nose bleeds Eyes: Negative for pain, discharge, redness, itching and visual disturbance. Neck: Negative for pain, stiffness Respiratory: Negative for cough, shortness of breath,   Cardiovascular: Negative for chest pain, palpitations and leg swelling. Gastrointestinal: Negative for abdominal pain, nausea, vomiting, diarrhea, constipations. Positive for frequent heartburn. Genitourinary: Negative for dysuria, urgency, frequency, hematuria. Positive for occassional herpes outbreak. Musculoskeletal: Negative for back pain, joint pain, joint  swelling, and gait problem.Negative for weakness.Continues with some knee pain. Neurological: Negative for dizziness, tremors, seizures, syncope,   light-headedness, numbness and headaches.  Hematological: Negative for easy bruising or bleeding Psychiatric/Behavioral: Negative for depression, anxiety, decreased concentration, confusion   Objective:   Filed Vitals:   11/17/14 1323  BP: 146/89  Pulse: 81  Temp: 98.4 F (36.9 C)  Resp:  98    Physical Exam: Constitutional: Patient appears well-developed and well-nourished. No distress. HENT: Normocephalic, atraumatic, External right and left ear normal. Oropharynx is clear and moist.  Eyes: Conjunctivae and EOM are normal. PERRLA, no scleral icterus. Neck: Normal ROM. Neck supple. No lymphadenopathy, No thyromegaly. CVS: RRR, S1/S2 +, no murmurs, no gallops, no rubs Pulmonary: Effort and breath sounds normal, no stridor, rhonchi,  wheezes, rales.  Abdominal: Soft. Normoactive BS,, no distension, tenderness, rebound or guarding.  Musculoskeletal: Normal range of motion. No edema and no tenderness.  Neuro: Alert.Normal muscle tone coordination. Non-focal Skin: Skin is warm and dry. No rash noted. Not diaphoretic. No erythema. No pallor. Psychiatric: Normal mood and affect. Behavior, judgment, thought content normal.  Lab Results  Component Value Date   WBC 12.0* 11/02/2014   HGB 15.9* 11/02/2014   HCT 45.4 11/02/2014   MCV 91.7 11/02/2014   PLT 316 11/02/2014   Lab Results  Component Value Date   CREATININE 0.55 11/02/2014   BUN 15 11/02/2014   NA 135 11/02/2014   K 4.3 11/02/2014   CL 105 11/02/2014   CO2 24 11/02/2014    No results found for: HGBA1C Lipid Panel     Component Value Date/Time   CHOL 147 11/02/2014 1452   TRIG 96 11/02/2014 1452   HDL 46 11/02/2014 1452   CHOLHDL 3.2 11/02/2014 1452   VLDL 19 11/02/2014 1452   LDLCALC 82 11/02/2014 1452       Assessment and plan:   1. Depression -Continue Celexa 20 mg daily. -prescription was refilled with 3 refills. -She is to follow-up in one month. Sooner if any issues arise.  2. Gastroesophageal reflux disease without esophagitis -Omeprazole 20 mg daily, # 30 with 3 refills.   Return in about 1 month (around 12/17/2014).  The patient was given clear instructions to go to ER or return to medical center if symptoms don't improve, worsen or new problems develop. The patient verbalized understanding.    Micheline Chapman FNP  11/17/2014, 2:11 PM

## 2014-11-18 ENCOUNTER — Encounter: Payer: Self-pay | Admitting: Family Medicine

## 2014-11-25 ENCOUNTER — Telehealth: Payer: Self-pay | Admitting: Family Medicine

## 2014-11-25 ENCOUNTER — Other Ambulatory Visit: Payer: Self-pay

## 2014-11-25 MED ORDER — CITALOPRAM HYDROBROMIDE 20 MG PO TABS: 20.0000 mg | ORAL_TABLET | Freq: Every day | ORAL | Status: DC

## 2014-11-25 MED ORDER — POTASSIUM CHLORIDE ER 10 MEQ PO TBCR: 10.0000 meq | EXTENDED_RELEASE_TABLET | Freq: Every day | ORAL | Status: DC

## 2014-11-25 NOTE — Telephone Encounter (Signed)
Refills sent into pharmacy. Thanks!  

## 2014-11-25 NOTE — Telephone Encounter (Signed)
Refills for citalopram and potassium sent into pharmacy. Thanks!

## 2014-12-19 ENCOUNTER — Ambulatory Visit (INDEPENDENT_AMBULATORY_CARE_PROVIDER_SITE_OTHER): Payer: Commercial Managed Care - HMO | Admitting: Family Medicine

## 2014-12-19 ENCOUNTER — Encounter: Payer: Self-pay | Admitting: Family Medicine

## 2014-12-19 VITALS — BP 155/89 | HR 86 | Temp 98.4°F | Resp 18 | Ht <= 58 in | Wt 157.0 lb

## 2014-12-19 DIAGNOSIS — F32A Depression, unspecified: Secondary | ICD-10-CM

## 2014-12-19 DIAGNOSIS — F329 Major depressive disorder, single episode, unspecified: Secondary | ICD-10-CM

## 2014-12-19 DIAGNOSIS — G4700 Insomnia, unspecified: Secondary | ICD-10-CM | POA: Insufficient documentation

## 2014-12-19 MED ORDER — TEMAZEPAM 7.5 MG PO CAPS: 7.5000 mg | ORAL_CAPSULE | Freq: Every evening | ORAL | Status: DC | PRN

## 2014-12-19 NOTE — Patient Instructions (Signed)
May take Restoril (temazepam) for sleep as needed. You can call in for a refill if you need before your next visit. Follow-up with me in three months. Check with your insurance company about the cream and let me know if they cover.

## 2014-12-19 NOTE — Progress Notes (Signed)
Patient ID: Christy Price, female   DOB: 1970/11/13, 44 y.o.   MRN: [id number]   Christy Price, is a 44 y.o. female  [id number]  [id number]  DOB - 1970/12/02  CC:  Chief Complaint  Patient presents with  . Follow-up  . Depression       HPI: Christy Price is a 44 y.o. female presents for follow-up on depression.  She reports she is feeling less depressed, is sleeping some better but still having some trouble with sleep is eating less that before. Her depression was trigger by a separation from her husband. I started her on Celexa around 8/24. She reports feeling enough improvement that she would like to continue the medication. She would also like something to help her sleep as that is her major problem at this time.She also complains of continued knee pain but reports it is definitely better on the naproxen.   Allergies  Allergen Reactions  . Oxycodone   . Rifampin    Past Medical History  Diagnosis Date  . Meningitis    Current Outpatient Prescriptions on File Prior to Visit  Medication Sig Dispense Refill  . acyclovir (ZOVIRAX) 200 MG capsule Take 1 capsule (200 mg total) by mouth 5 (five) times daily. 25 capsule 1  . acyclovir ointment (ZOVIRAX) 5 % Apply 1 application topically every 3 (three) hours. 15 g 1  . citalopram (CELEXA) 20 MG tablet Take 1 tablet (20 mg total) by mouth daily. 30 tablet 3  . hydrochlorothiazide (HYDRODIURIL) 25 MG tablet Take 1 tablet (25 mg total) by mouth daily. 90 tablet 3  . nabumetone (RELAFEN) 750 MG tablet Take 750 mg by mouth 2 (two) times daily as needed.    . naproxen (NAPROSYN) 500 MG tablet Take 1 tablet (500 mg total) by mouth 2 (two) times daily with a meal. 30 tablet 0  . omeprazole (PRILOSEC) 20 MG capsule Take 1 capsule (20 mg total) by mouth daily. 30 capsule 3  . potassium chloride (K-DUR) 10 MEQ tablet Take 1 tablet (10 mEq total) by mouth daily. 30 tablet 3  . promethazine (PHENERGAN) 12.5 MG tablet Take 12.5 mg by  mouth every 6 (six) hours as needed for nausea or vomiting.     No current facility-administered medications on file prior to visit.   No family history on file. Social History   Social History  . Marital Status: Married    Spouse Name: N/A  . Number of Children: N/A  . Years of Education: N/A   Occupational History  . Not on file.   Social History Main Topics  . Smoking status: Current Every Day Smoker -- 1.00 packs/day    Types: Cigarettes  . Smokeless tobacco: Not on file  . Alcohol Use: Not on file  . Drug Use: Not on file  . Sexual Activity: Not on file   Other Topics Concern  . Not on file   Social History Narrative    Review of Systems: Constitutional: Negative  HENT: Negative  Eyes: Negative  Neck: Negative  Respiratory: Negative  Cardiovascular: Negative  Gastrointestinal: Negative  Genitourinary: Negative  Musculoskeletal: Negative Neurological: Negative  numbness  Hematological: Negative  Psychiatric/Behavioral: Still some depressed mood and trouble sleeping but feels she is doing better.    Objective:   Filed Vitals:   12/19/14 1526  BP: 155/89  Pulse: 86  Temp: 98.4 F (36.9 C)  Resp: 18    Physical Exam: Constitutional: Patient appears well-developed and well-nourished. No distress. HENT: Normocephalic, atraumatic,  External right and left ear normal. Oropharynx is clear and moist.  Eyes: Conjunctivae and EOM are normal. PERRLA, no scleral icterus. Neck: Normal ROM. Neck supple. No lymphadenopathy, No thyromegaly. CVS: RRR, S1/S2 +, no murmurs, no gallops, no rubs Pulmonary: Effort and breath sounds normal, no stridor, rhonchi, wheezes, rales.  Abdominal: Soft. Normoactive BS,, no distension, tenderness, rebound or guarding.  Musculoskeletal: Normal range of motion. No edema and no tenderness.  Neuro: Alert.Normal muscle tone coordination. Non-focal Skin: Skin is warm and dry. No rash noted. Not diaphoretic. No erythema. No  pallor. Psychiatric: Normal mood and affect. Behavior, judgment, thought content normal.  Lab Results  Component Value Date   WBC 12.0* 11/02/2014   HGB 15.9* 11/02/2014   HCT 45.4 11/02/2014   MCV 91.7 11/02/2014   PLT 316 11/02/2014   Lab Results  Component Value Date   CREATININE 0.55 11/02/2014   BUN 15 11/02/2014   NA 135 11/02/2014   K 4.3 11/02/2014   CL 105 11/02/2014   CO2 24 11/02/2014    No results found for: HGBA1C Lipid Panel     Component Value Date/Time   CHOL 147 11/02/2014 1452   TRIG 96 11/02/2014 1452   HDL 46 11/02/2014 1452   CHOLHDL 3.2 11/02/2014 1452   VLDL 19 11/02/2014 1452   LDLCALC 82 11/02/2014 1452       Assessment and plan:   1. Depression, situational - improving -Continue Celexa and follow-up in 3 months and prn.  2. Insomnia -Restoril 7.5 mg, #30, on po at HS prn.  Follow-up 3 months and prn.  The patient was given clear instructions to go to ER or return to medical center if symptoms don't improve, worsen or new problems develop. The patient verbalized understanding.      Micheline Chapman, MSN, FNP-BC   12/19/2014, 3:48 PM

## 2014-12-26 ENCOUNTER — Telehealth: Payer: Self-pay

## 2014-12-26 NOTE — Telephone Encounter (Signed)
Received a message from [place] with [facility] Restoril 7.5mg  is not covered on patients insurance, however 15mg  is covered. Can this be changed to 15mg  or something different be sent in for her? Please advise. Thanks!

## 2014-12-26 NOTE — Telephone Encounter (Signed)
This has been changed to 15mg  with the pharmacy. Thanks!

## 2014-12-26 NOTE — Telephone Encounter (Signed)
Can be changed to 15

## 2015-02-17 ENCOUNTER — Other Ambulatory Visit: Payer: Self-pay | Admitting: Family Medicine

## 2015-02-21 ENCOUNTER — Other Ambulatory Visit: Payer: Self-pay | Admitting: Family Medicine

## 2015-02-21 MED ORDER — NAPROXEN 500 MG PO TABS: 500.0000 mg | ORAL_TABLET | Freq: Two times a day (BID) | ORAL | Status: DC

## 2015-02-21 NOTE — Telephone Encounter (Signed)
Refill for naproxen sent into pharmacy. Thanks!  

## 2015-03-11 ENCOUNTER — Other Ambulatory Visit: Payer: Self-pay | Admitting: Family Medicine

## 2015-03-27 ENCOUNTER — Ambulatory Visit (INDEPENDENT_AMBULATORY_CARE_PROVIDER_SITE_OTHER): Payer: Commercial Managed Care - HMO | Admitting: Family Medicine

## 2015-03-27 ENCOUNTER — Encounter: Payer: Self-pay | Admitting: Family Medicine

## 2015-03-27 VITALS — BP 137/80 | HR 64 | Temp 98.1°F | Resp 18 | Ht 59.0 in | Wt 158.0 lb

## 2015-03-27 DIAGNOSIS — B351 Tinea unguium: Secondary | ICD-10-CM | POA: Diagnosis not present

## 2015-03-27 DIAGNOSIS — F32A Depression, unspecified: Secondary | ICD-10-CM

## 2015-03-27 DIAGNOSIS — M79674 Pain in right toe(s): Secondary | ICD-10-CM | POA: Diagnosis not present

## 2015-03-27 DIAGNOSIS — F329 Major depressive disorder, single episode, unspecified: Secondary | ICD-10-CM | POA: Diagnosis not present

## 2015-03-27 MED ORDER — CITALOPRAM HYDROBROMIDE 20 MG PO TABS: 20.0000 mg | ORAL_TABLET | Freq: Every day | ORAL | Status: DC

## 2015-03-27 MED ORDER — NAPROXEN 500 MG PO TABS: 500.0000 mg | ORAL_TABLET | Freq: Two times a day (BID) | ORAL | Status: DC

## 2015-03-27 NOTE — Patient Instructions (Signed)
Continue Cymbalta for depression Take naproxen 500 mg for pain in toe joint. Some people report soaking feet in vinegar (apple cider vinegar) daily will help the nail fungus.

## 2015-03-27 NOTE — Progress Notes (Signed)
Patient here for 3 month FU for BP and Knee Pain  Patient complains of pain in right knee scaled currently at a 4 described as tight and radiating down to toes. More so in the evening.  Patient also complains of Big Toe with no nailbed being painful.  Bruise on right calf with bilateral edema.  Patient needs refills on Phenergan, HCTZ,Celexa, Potassium

## 2015-03-30 DIAGNOSIS — B351 Tinea unguium: Secondary | ICD-10-CM | POA: Insufficient documentation

## 2015-03-30 DIAGNOSIS — M79676 Pain in unspecified toe(s): Secondary | ICD-10-CM | POA: Insufficient documentation

## 2015-03-30 NOTE — Progress Notes (Signed)
Patient ID: Christy Price, female   DOB: 1970-11-09, 45 y.o.   MRN: [id number]   Christy Price, is a 45 y.o. female  [id number]  [id number]  DOB - [birth year]  CC:  Chief Complaint  Patient presents with  . Follow-up       HPI: Christy Price is a 45 y.o. female here to follow-up on depression. She reports feeling much less depressed with Celexa and wishes to continue. She denies any bothersome side effects.  She also complains of a several week history of pain in her right foot around the 1st metatarsal joint and a deformed great toenail on the same foot. Otherwise nothing has changed since her last visit. She is using Restoril for sleep only occasionally.  Allergies  Allergen Reactions  . Oxycodone   . Rifampin    Past Medical History  Diagnosis Date  . Meningitis    Current Outpatient Prescriptions on File Prior to Visit  Medication Sig Dispense Refill  . acyclovir (ZOVIRAX) 200 MG capsule Take 1 capsule (200 mg total) by mouth 5 (five) times daily. 25 capsule 1  . hydrochlorothiazide (HYDRODIURIL) 25 MG tablet Take 1 tablet (25 mg total) by mouth daily. 90 tablet 3  . nabumetone (RELAFEN) 750 MG tablet Take 750 mg by mouth 2 (two) times daily as needed.    Marland Kitchen omeprazole (PRILOSEC) 20 MG capsule Take 1 capsule (20 mg total) by mouth daily. 30 capsule 3  . potassium chloride (K-DUR) 10 MEQ tablet Take 1 tablet (10 mEq total) by mouth daily. 30 tablet 3  . promethazine (PHENERGAN) 12.5 MG tablet Take 12.5 mg by mouth every 6 (six) hours as needed for nausea or vomiting.    . temazepam (RESTORIL) 7.5 MG capsule Take 1 capsule (7.5 mg total) by mouth at bedtime as needed for sleep. 30 capsule 0  . acyclovir ointment (ZOVIRAX) 5 % Apply 1 application topically every 3 (three) hours. (Patient not taking: Reported on 03/27/2015) 15 g 1   No current facility-administered medications on file prior to visit.   History reviewed. No pertinent family history. Social History    Social History  . Marital Status: Married    Spouse Name: N/A  . Number of Children: N/A  . Years of Education: N/A   Occupational History  . Not on file.   Social History Main Topics  . Smoking status: Current Every Day Smoker -- 1.00 packs/day    Types: Cigarettes  . Smokeless tobacco: Not on file  . Alcohol Use: No  . Drug Use: No  . Sexual Activity: Not on file   Other Topics Concern  . Not on file   Social History Narrative    Review of Systems: Constitutional: Negative for fever, chills, appetite change, weight loss,  Fatigue. Skin: Negative for rashes or lesions of concern. HENT: Negative for ear pain, ear discharge.nose bleeds Eyes: Negative for pain, discharge, redness, itching and visual disturbance. Neck: Negative for pain, stiffness Respiratory: Negative for cough, shortness of breath,   Cardiovascular: Negative for chest pain, palpitations and leg swelling. Gastrointestinal: Negative for abdominal pain, nausea, vomiting, diarrhea, constipations Genitourinary: Negative for dysuria, urgency, frequency, hematuria,  Musculoskeletal: Negative for back pain, joint pain, joint  swelling, and gait problem.Negative for weakness. Positive for pain in the first metatarsal joint of her right foot. Neurological: Negative for dizziness, tremors, seizures, syncope,   light-headedness, numbness and headaches.  Hematological: Negative for easy bruising or bleeding Psychiatric/Behavioral: Improved depression, anxiety.   Objective:   Filed Vitals:  03/27/15 1621  BP: 137/80  Pulse: 64  Temp: 98.1 F (36.7 C)  Resp: 18    Physical Exam: Constitutional: Patient appears well-developed and well-nourished. No distress. HENT: Normocephalic, atraumatic, External right and left ear normal. Oropharynx is clear and moist.  Eyes: Conjunctivae and EOM are normal. PERRLA, no scleral icterus. Neck: Normal ROM. Neck supple. No lymphadenopathy, No thyromegaly. CVS: RRR, S1/S2 +,  no murmurs, no gallops, no rubs Pulmonary: Effort and breath sounds normal, no stridor, rhonchi, wheezes, rales.  Abdominal: Soft. Normoactive BS,, no distension, tenderness, rebound or guarding.  Musculoskeletal: Normal range of motion. No edema. There is tenderness but no redness or swelling of the involved toe joint. She does have a deformed great toenail, consistent with a nail fungus. Neuro: Alert.Normal muscle tone coordination. Non-focal Skin: Skin is warm and dry. No rash noted. Not diaphoretic. No erythema. No pallor. Psychiatric: Normal mood and affect. Behavior, judgment, thought content normal.  Lab Results  Component Value Date   WBC 12.0* 11/02/2014   HGB 15.9* 11/02/2014   HCT 45.4 11/02/2014   MCV 91.7 11/02/2014   PLT 316 11/02/2014   Lab Results  Component Value Date   CREATININE 0.55 11/02/2014   BUN 15 11/02/2014   NA 135 11/02/2014   K 4.3 11/02/2014   CL 105 11/02/2014   CO2 24 11/02/2014    No results found for: HGBA1C Lipid Panel     Component Value Date/Time   CHOL 147 11/02/2014 1452   TRIG 96 11/02/2014 1452   HDL 46 11/02/2014 1452   CHOLHDL 3.2 11/02/2014 1452   VLDL 19 11/02/2014 1452   LDLCALC 82 11/02/2014 1452       Assessment and plan:   1. Depression, better on Celexa. -Refill Celexa 20 mg #90, one po q day with one refill -Follow-up in 6 monhts  2. Toe pain -naproxen 500, #60, one po bid with food  3. 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 -I recommended daily soaks in apple cider vinegar and follow-up if should start causing pain.  Return in about 6 months (around 09/24/2015).  The patient was given clear instructions to go to ER or return to medical center if symptoms don't improve, worsen or new problems develop. The patient verbalized understanding.    Micheline Chapman FNP  03/30/2015, 9:28 AM

## 2015-03-31 ENCOUNTER — Other Ambulatory Visit: Payer: Self-pay | Admitting: Family Medicine

## 2015-05-18 ENCOUNTER — Encounter: Payer: Self-pay | Admitting: Family Medicine

## 2015-05-18 ENCOUNTER — Ambulatory Visit (INDEPENDENT_AMBULATORY_CARE_PROVIDER_SITE_OTHER): Payer: Commercial Managed Care - HMO | Admitting: Family Medicine

## 2015-05-18 VITALS — BP 141/82 | HR 67 | Temp 98.1°F | Resp 16 | Ht 59.0 in | Wt 161.0 lb

## 2015-05-18 DIAGNOSIS — H6592 Unspecified nonsuppurative otitis media, left ear: Secondary | ICD-10-CM

## 2015-05-18 MED ORDER — FLUTICASONE PROPIONATE 50 MCG/ACT NA SUSP: 2.0000 | Freq: Every day | NASAL | Status: DC

## 2015-05-18 NOTE — Progress Notes (Signed)
Patient ID: Christy Price, female   DOB: 04/11/1970, 44 y.o.   MRN: [id number]   Christy Price, is a 45 y.o. female  [id number]  [id number]  DOB - 04-Jun-1970  CC:  Chief Complaint  Patient presents with  . Tinnitus    ringing in left ear x 5 days (like a "wooshing" noise)   . Medication Refill    refill on phenergan and omeprazole        HPI: Christy Price is a 45 y.o. female here c/o of a 5 day history of a swishing sound in her left ear. About the same time, she started with nasal allergy symptoms, including itchy, stuffy nose and stratchy throat. She does have a history of allergic rhinitis. 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 She is accompanied by her mother today who acts as an interpreter with sign language.  She denies other complaints today.  Health Maintenance:  Is in need of a mammogram. Reminded her of same.  She declines flu shot. States she had a Tdap in 2004.  Allergies  Allergen Reactions  . Oxycodone   . Rifampin    Past Medical History  Diagnosis Date  . Meningitis    Current Outpatient Prescriptions on File Prior to Visit  Medication Sig Dispense Refill  . acyclovir (ZOVIRAX) 200 MG capsule Take 1 capsule (200 mg total) by mouth 5 (five) times daily. 25 capsule 1  . citalopram (CELEXA) 20 MG tablet Take 1 tablet (20 mg total) by mouth daily. 90 tablet 1  . hydrochlorothiazide (HYDRODIURIL) 25 MG tablet Take 1 tablet (25 mg total) by mouth daily. 90 tablet 3  . KLOR-CON M10 10 MEQ tablet TAKE 1 TABLET BY MOUTH EVERY DAY 30 tablet 3  . nabumetone (RELAFEN) 750 MG tablet Take 750 mg by mouth 2 (two) times daily as needed.    . naproxen (NAPROSYN) 500 MG tablet Take 1 tablet (500 mg total) by mouth 2 (two) times daily with a meal. 60 tablet 1  . omeprazole (PRILOSEC) 20 MG capsule Take 1 capsule (20 mg total) by mouth daily. 30 capsule 3  . promethazine (PHENERGAN) 12.5 MG tablet Take 12.5 mg by mouth every 6 (six) hours as  needed for nausea or vomiting.    . temazepam (RESTORIL) 7.5 MG capsule Take 1 capsule (7.5 mg total) by mouth at bedtime as needed for sleep. 30 capsule 0  . acyclovir ointment (ZOVIRAX) 5 % Apply 1 application topically every 3 (three) hours. (Patient not taking: Reported on 03/27/2015) 15 g 1   No current facility-administered medications on file prior to visit.   No family history on file. Social History   Social History  . Marital Status: Married    Spouse Name: N/A  . Number of Children: N/A  . Years of Education: N/A   Occupational History  . Not on file.   Social History Main Topics  . Smoking status: Current Every Day Smoker -- 1.00 packs/day    Types: Cigarettes  . Smokeless tobacco: Not on file  . Alcohol Use: No  . Drug Use: No  . Sexual Activity: Not on file   Other Topics Concern  . Not on file   Social History Narrative    Review of Systems: Constitutional: Negative for fever, chills, appetite change, weight loss,  Fatigue. Skin: Negative for rashes or lesions of concern. HENT: Negative for ear pain, ear discharge.nose bleeds Eyes: Negative for pain, discharge, redness, itching and visual  disturbance. Neck: Negative for pain, stiffness Respiratory: Negative for cough, shortness of breath,   Cardiovascular: Negative for chest pain, palpitations and leg swelling. Gastrointestinal: Negative for abdominal pain, nausea, vomiting, diarrhea, constipations Genitourinary: Negative for dysuria, urgency, frequency, hematuria,  Musculoskeletal: Negative for back pain, joint pain, joint  swelling, and gait problem.Negative for weakness. Neurological: Negative for dizziness, tremors, seizures, syncope,   light-headedness, numbness and headaches.  Hematological: Negative for easy bruising or bleeding Psychiatric/Behavioral: Negative for depression, anxiety, decreased concentration, confusion   Objective:   Filed Vitals:   05/18/15 1316  BP: 141/82  Pulse: 67   Temp: 98.1 F (36.7 C)  Resp: 16    Physical Exam: Constitutional: Patient appears well-developed and well-nourished. No distress. HENT: Normocephalic, atraumatic, External right and left ear normal. Oropharynx is clear and moist.  Eyes: Conjunctivae and EOM are normal. PERRLA, no scleral icterus. Neck: Normal ROM. Neck supple. No lymphadenopathy, No thyromegaly. CVS: RRR, S1/S2 +, no murmurs, no gallops, no rubs Pulmonary: Effort and breath sounds normal, no stridor, rhonchi, wheezes, rales.  Abdominal: Soft. Normoactive BS,, no distension, tenderness, rebound or guarding.  Musculoskeletal: Normal range of motion. No edema and no tenderness.  Neuro: Alert.Normal muscle tone coordination. Non-focal Skin: Skin is warm and dry. No rash noted. Not diaphoretic. No erythema. No pallor. Psychiatric: Normal mood and affect. Behavior, judgment, thought content normal.  Lab Results  Component Value Date   WBC 12.0* 11/02/2014   HGB 15.9* 11/02/2014   HCT 45.4 11/02/2014   MCV 91.7 11/02/2014   PLT 316 11/02/2014   Lab Results  Component Value Date   CREATININE 0.55 11/02/2014   BUN 15 11/02/2014   NA 135 11/02/2014   K 4.3 11/02/2014   CL 105 11/02/2014   CO2 24 11/02/2014    No results found for: HGBA1C Lipid Panel     Component Value Date/Time   CHOL 147 11/02/2014 1452   TRIG 96 11/02/2014 1452   HDL 46 11/02/2014 1452   CHOLHDL 3.2 11/02/2014 1452   VLDL 19 11/02/2014 1452   LDLCALC 82 11/02/2014 1452       Assessment and plan:   1. Left serous otitis media, recurrence not specified, unspecified chronicity 2. Tinnitus - fluticasone (FLONASE) 50 MCG/ACT nasal spray; Place 2 sprays into both nostrils daily.  Dispense: 16 g; Refill: 6   Return if symptoms worsen or fail to improve.  The patient was given clear instructions to go to ER or return to medical center if symptoms don't improve, worsen or new problems develop. The patient verbalized understanding.     Micheline Chapman FNP  05/18/2015, 1:52 PM

## 2015-05-18 NOTE — Patient Instructions (Signed)
Use nasal spray 2 sprays in each nostril once a day for one week, then decrease to one spray a day. Let me know if not improving in 5-7 days. Call and make appt for mammogram.

## 2015-07-10 ENCOUNTER — Other Ambulatory Visit: Payer: Self-pay | Admitting: Family Medicine

## 2015-08-22 ENCOUNTER — Encounter (HOSPITAL_COMMUNITY): Payer: Self-pay | Admitting: Emergency Medicine

## 2015-08-22 ENCOUNTER — Ambulatory Visit (INDEPENDENT_AMBULATORY_CARE_PROVIDER_SITE_OTHER): Payer: Commercial Managed Care - HMO

## 2015-08-22 ENCOUNTER — Ambulatory Visit (HOSPITAL_COMMUNITY)
Admission: EM | Admit: 2015-08-22 | Discharge: 2015-08-22 | Disposition: A | Discharge status: Home / Self Care | Payer: Commercial Managed Care - HMO | Attending: Family Medicine | Admitting: Family Medicine

## 2015-08-22 DIAGNOSIS — M67972 Unspecified disorder of synovium and tendon, left ankle and foot: Secondary | ICD-10-CM | POA: Diagnosis not present

## 2015-08-22 DIAGNOSIS — M7989 Other specified soft tissue disorders: Secondary | ICD-10-CM | POA: Diagnosis not present

## 2015-08-22 DIAGNOSIS — M25572 Pain in left ankle and joints of left foot: Secondary | ICD-10-CM | POA: Diagnosis not present

## 2015-08-22 MED ORDER — INDOMETHACIN 25 MG PO CAPS: 25.0000 mg | ORAL_CAPSULE | Freq: Three times a day (TID) | ORAL | Status: DC

## 2015-08-22 MED ORDER — NAPROXEN 500 MG PO TABS: 500.0000 mg | ORAL_TABLET | Freq: Two times a day (BID) | ORAL | Status: DC

## 2015-08-22 NOTE — ED Notes (Signed)
No youth size crutches in stock at the [facility]....waiting on supply to bring some for the patient.

## 2015-08-22 NOTE — Discharge Instructions (Signed)
Wear boot and use crutches as needed to reduce pain, take medicine as needed, see orthopedist this week for recheck.

## 2015-08-22 NOTE — ED Provider Notes (Signed)
CSN: [id number]     Arrival date & time 08/22/15  1336 History   First MD Initiated Contact with Patient 08/22/15 1450     Chief Complaint  Patient presents with  . Foot Pain   (Consider location/radiation/quality/duration/timing/severity/associated sxs/prior Treatment) Patient is a 45 y.o. female presenting with lower extremity pain. The history is provided by the patient. The history is limited by a language barrier. A language interpreter was used (deaf-sign interpr used).  Foot Pain This is a new problem. The current episode started more than 1 week ago. The problem has been gradually worsening. The symptoms are aggravated by walking.    Past Medical History  Diagnosis Date  . Meningitis    Past Surgical History  Procedure Laterality Date  . Shunt replacement     History reviewed. No pertinent family history. Social History  Substance Use Topics  . Smoking status: Current Every Day Smoker -- 1.00 packs/day    Types: Cigarettes  . Smokeless tobacco: None  . Alcohol Use: No   OB History    No data available     Review of Systems  Constitutional: Negative.   Musculoskeletal: Positive for joint swelling and gait problem.  Skin: Negative.   All other systems reviewed and are negative.   Allergies  Oxycodone and Rifampin  Home Medications   Prior to Admission medications   Medication Sig Start Date End Date Taking? Authorizing Provider  citalopram (CELEXA) 20 MG tablet Take 1 tablet (20 mg total) by mouth daily. 03/27/15  Yes Micheline Chapman, NP  hydrochlorothiazide (HYDRODIURIL) 25 MG tablet Take 1 tablet (25 mg total) by mouth daily. 11/02/14  Yes Micheline Chapman, NP  KLOR-CON M10 10 MEQ tablet TAKE 1 TABLET BY MOUTH EVERY DAY 07/10/15  Yes Micheline Chapman, NP  acyclovir (ZOVIRAX) 200 MG capsule Take 1 capsule (200 mg total) by mouth 5 (five) times daily. 11/02/14   Micheline Chapman, NP  acyclovir ointment (ZOVIRAX) 5 % Apply 1 application topically every 3  (three) hours. Patient not taking: Reported on 03/27/2015 11/17/14   Micheline Chapman, NP  fluticasone [facility]) 50 MCG/ACT nasal spray Place 2 sprays into both nostrils daily. 05/18/15   Micheline Chapman, NP  nabumetone (RELAFEN) 750 MG tablet Take 750 mg by mouth 2 (two) times daily as needed.    Historical Provider, MD  naproxen (NAPROSYN) 500 MG tablet Take 1 tablet (500 mg total) by mouth 2 (two) times daily with a meal. 08/22/15   Billy Fischer, MD  omeprazole (PRILOSEC) 20 MG capsule Take 1 capsule (20 mg total) by mouth daily. 11/17/14   Micheline Chapman, NP  promethazine (PHENERGAN) 12.5 MG tablet Take 12.5 mg by mouth every 6 (six) hours as needed for nausea or vomiting.    Historical Provider, MD  temazepam (RESTORIL) 7.5 MG capsule Take 1 capsule (7.5 mg total) by mouth at bedtime as needed for sleep. 12/19/14   Micheline Chapman, NP   Meds Ordered and Administered this Visit  Medications - No data to display  BP 126/84 mmHg  Pulse 69  Temp(Src) 97.7 F (36.5 C) (Oral)  Resp 16  SpO2 97% No data found.   Physical Exam  Constitutional: She is oriented to person, place, and time. She appears well-developed and well-nourished. No distress.  Musculoskeletal: She exhibits tenderness.       Left ankle: She exhibits decreased range of motion and swelling. No head of 5th metatarsal and no proximal fibula tenderness found.  Achilles tendon exhibits pain and abnormal Thompson's test results. Achilles tendon exhibits no defect.  Neurological: She is alert and oriented to person, place, and time.  Skin: Skin is warm and dry.  Nursing note and vitals reviewed.   ED Course  Procedures (including critical care time)  Labs Review Labs Reviewed - No data to display  Imaging Review Dg Ankle Complete Left  08/22/2015  CLINICAL DATA:  Posterior ankle pain two weeks. No injury. Swelling. EXAM: LEFT ANKLE COMPLETE - 3+ VIEW COMPARISON:  04/29/2008 FINDINGS: Normal ankle joint. No fracture.  Calcaneal spurring with mild progression since the prior study. IMPRESSION: Negative for fracture or arthropathy.  Calcaneal spurring. Electronically Signed   By: Franchot Gallo M.D.   On: 08/22/2015 15:21     Visual Acuity Review  Right Eye Distance:   Left Eye Distance:   Bilateral Distance:    Right Eye Near:   Left Eye Near:    Bilateral Near:         MDM   1. Achilles tendon disorder, left       Billy Fischer, MD 08/22/15 1623

## 2015-08-22 NOTE — ED Notes (Signed)
The patient presented to the [facility] with a complaint of left foot and ankle pain that started 5 days ago. The patient denied any know injury.

## 2015-08-22 NOTE — ED Notes (Addendum)
Waiting for translator/ patient waiting in lobby for translator.  Eta 2:20 pm

## 2015-08-25 DIAGNOSIS — M722 Plantar fascial fibromatosis: Secondary | ICD-10-CM | POA: Diagnosis not present

## 2015-08-25 DIAGNOSIS — M7662 Achilles tendinitis, left leg: Secondary | ICD-10-CM | POA: Diagnosis not present

## 2015-08-25 DIAGNOSIS — M79671 Pain in right foot: Secondary | ICD-10-CM | POA: Diagnosis not present

## 2015-09-08 ENCOUNTER — Other Ambulatory Visit: Payer: Self-pay | Admitting: Family Medicine

## 2015-09-08 DIAGNOSIS — M722 Plantar fascial fibromatosis: Secondary | ICD-10-CM | POA: Diagnosis not present

## 2015-09-08 DIAGNOSIS — M79671 Pain in right foot: Secondary | ICD-10-CM | POA: Diagnosis not present

## 2015-09-08 DIAGNOSIS — M7662 Achilles tendinitis, left leg: Secondary | ICD-10-CM | POA: Diagnosis not present

## 2015-09-10 ENCOUNTER — Other Ambulatory Visit: Payer: Self-pay | Admitting: Family Medicine

## 2015-09-20 ENCOUNTER — Encounter (HOSPITAL_COMMUNITY): Payer: Self-pay | Admitting: Emergency Medicine

## 2015-09-20 ENCOUNTER — Ambulatory Visit (HOSPITAL_COMMUNITY)
Admission: EM | Admit: 2015-09-20 | Discharge: 2015-09-20 | Disposition: A | Discharge status: Home / Self Care | Payer: Commercial Managed Care - HMO | Attending: Internal Medicine | Admitting: Internal Medicine

## 2015-09-20 DIAGNOSIS — S46811A Strain of other muscles, fascia and tendons at shoulder and upper arm level, right arm, initial encounter: Secondary | ICD-10-CM | POA: Diagnosis not present

## 2015-09-20 MED ORDER — CYCLOBENZAPRINE HCL 10 MG PO TABS: 10.0000 mg | ORAL_TABLET | Freq: Every day | ORAL | Status: DC

## 2015-09-20 NOTE — ED Notes (Signed)
Right shoulder pain, pain in right arm.  Pain in right wrist.  Fingers feel tingling.  Moving fingers causes shock-like sensation up arm.

## 2015-09-20 NOTE — ED Provider Notes (Signed)
CSN: [id number]     Arrival date & time 09/20/15  1724 History   First MD Initiated Contact with Patient 09/20/15 1913     Chief Complaint  Patient presents with  . Shoulder Pain   (Consider location/radiation/quality/duration/timing/severity/associated sxs/prior Treatment) HPI Comments: The patient complains of right arm pain as well as tingling in her right hand. She denies trauma or strenuous exercise. Cannot recall the moment her arm began to hurt but has been aware of it for at least 4 days now. Past medical history is significant for epilepsy. The patient had a seizure sometime proximate to the onset of arm pain.  Patient is a 45 y.o. female presenting with shoulder pain. The history is provided by the patient. A language interpreter was used (sign language).  Shoulder Pain Location:  Shoulder, arm and hand Upper extremity injury: probably.   Shoulder location:  R shoulder Arm location:  R upper arm Hand location:  R hand Pain details:    Quality:  Tingling   Severity:  Moderate   Onset quality:  Unable to specify   Duration:  4 days   Progression:  Waxing and waning Chronicity:  New Dislocation: no   Relieved by:  Nothing Worsened by:  Movement Ineffective treatments:  NSAIDs   Past Medical History  Diagnosis Date  . Meningitis    Past Surgical History  Procedure Laterality Date  . Shunt replacement     No family history on file. Social History  Substance Use Topics  . Smoking status: Current Every Day Smoker -- 1.00 packs/day    Types: Cigarettes  . Smokeless tobacco: None  . Alcohol Use: No   OB History    No data available     Review of Systems  Neurological: Negative for weakness.    Allergies  Oxycodone and Rifampin  Home Medications   Prior to Admission medications   Medication Sig Start Date End Date Taking? Authorizing Provider  acyclovir (ZOVIRAX) 200 MG capsule Take 1 capsule (200 mg total) by mouth 5 (five) times daily. 11/02/14   Micheline Chapman, NP  acyclovir ointment (ZOVIRAX) 5 % Apply 1 application topically every 3 (three) hours. Patient not taking: Reported on 03/27/2015 11/17/14   Micheline Chapman, NP  citalopram (CELEXA) 20 MG tablet TAKE 1 TABLET (20 MG TOTAL) BY MOUTH DAILY. 09/08/15   Micheline Chapman, NP  cyclobenzaprine (FLEXERIL) 10 MG tablet Take 1 tablet (10 mg total) by mouth at bedtime. 09/20/15   Harrie Foreman, MD  fluticasone (FLONASE) 50 MCG/ACT nasal spray Place 2 sprays into both nostrils daily. 05/18/15   Micheline Chapman, NP  hydrochlorothiazide (HYDRODIURIL) 25 MG tablet TAKE 1 TABLET EVERY DAY 09/13/15   Micheline Chapman, NP  indomethacin (INDOCIN) 25 MG capsule Take 1 capsule (25 mg total) by mouth 3 (three) times daily with meals. 08/22/15   Billy Fischer, MD  KLOR-CON M10 10 MEQ tablet TAKE 1 TABLET BY MOUTH EVERY DAY 07/10/15   Micheline Chapman, NP  nabumetone (RELAFEN) 750 MG tablet Take 750 mg by mouth 2 (two) times daily as needed.    Historical Provider, MD  naproxen (NAPROSYN) 500 MG tablet Take 1 tablet (500 mg total) by mouth 2 (two) times daily with a meal. 08/22/15   Billy Fischer, MD  omeprazole (PRILOSEC) 20 MG capsule Take 1 capsule (20 mg total) by mouth daily. 11/17/14   Micheline Chapman, NP  promethazine (PHENERGAN) 12.5 MG tablet Take 12.5 mg by  mouth every 6 (six) hours as needed for nausea or vomiting.    Historical Provider, MD  temazepam (RESTORIL) 7.5 MG capsule Take 1 capsule (7.5 mg total) by mouth at bedtime as needed for sleep. 12/19/14   Micheline Chapman, NP   Meds Ordered and Administered this Visit  Medications - No data to display  BP 143/68 mmHg  Pulse 77  Temp(Src) 98.4 F (36.9 C) (Oral)  Resp 12  SpO2 100% No data found.   Physical Exam  Constitutional: She is oriented to person, place, and time. She appears well-developed and well-nourished. No distress.  HENT:  Head: Normocephalic and atraumatic.  Mouth/Throat: Oropharynx is clear and moist.  Eyes:  Conjunctivae and EOM are normal. Pupils are equal, round, and reactive to light. No scleral icterus.  Neck: Normal range of motion. Neck supple. No JVD present. No tracheal deviation present. No thyromegaly present.  Cardiovascular: Normal rate, regular rhythm and normal heart sounds.  Exam reveals no gallop and no friction rub.   No murmur heard. Pulmonary/Chest: Effort normal and breath sounds normal.  Abdominal: Soft. Bowel sounds are normal. She exhibits no distension. There is no tenderness.  Musculoskeletal: Normal range of motion. She exhibits no edema.       Arms: Pain present right anterior shoulder and worsened with resistance to internal rotation. Normal grip strength. Some aching and triceps with pushing or extension of the forearm  Lymphadenopathy:    She has no cervical adenopathy.  Neurological: She is alert and oriented to person, place, and time. No cranial nerve deficit.  Skin: Skin is warm and dry.  Psychiatric: She has a normal mood and affect. Her behavior is normal. Judgment and thought content normal.  Nursing note and vitals reviewed.   ED Course  Procedures (including critical care time)  Labs Review Labs Reviewed - No data to display  Imaging Review No results found.   Visual Acuity Review  Right Eye Distance:   Left Eye Distance:   Bilateral Distance:    Right Eye Near:   Left Eye Near:    Bilateral Near:         MDM   1. Strain of deltoid muscle, right, initial encounter    Likely minor strain or tear of anterior deltoid during recent seizure. Possible nerve impingement involving medial cord of brachial plexus. Gentle physical therapy and intermittent immobilization. May use NSAIDs. If no better in a few weeks May need orthopedic referral and MRI.    Harrie Foreman, MD 09/20/15 [phone]

## 2015-09-20 NOTE — ED Notes (Signed)
Delay in care secondary to delay in translator arriving in department

## 2015-10-04 DIAGNOSIS — M722 Plantar fascial fibromatosis: Secondary | ICD-10-CM | POA: Diagnosis not present

## 2015-10-04 DIAGNOSIS — M7662 Achilles tendinitis, left leg: Secondary | ICD-10-CM | POA: Diagnosis not present

## 2015-10-04 DIAGNOSIS — M7541 Impingement syndrome of right shoulder: Secondary | ICD-10-CM | POA: Diagnosis not present

## 2015-11-09 DIAGNOSIS — M722 Plantar fascial fibromatosis: Secondary | ICD-10-CM | POA: Diagnosis not present

## 2015-11-09 DIAGNOSIS — M7662 Achilles tendinitis, left leg: Secondary | ICD-10-CM | POA: Diagnosis not present

## 2015-11-09 DIAGNOSIS — M79671 Pain in right foot: Secondary | ICD-10-CM | POA: Diagnosis not present

## 2015-11-09 DIAGNOSIS — M7541 Impingement syndrome of right shoulder: Secondary | ICD-10-CM | POA: Diagnosis not present

## 2015-11-16 ENCOUNTER — Ambulatory Visit: Payer: Commercial Managed Care - HMO | Attending: Orthopedic Surgery | Admitting: Physical Therapy

## 2015-11-16 ENCOUNTER — Encounter: Payer: Self-pay | Admitting: Physical Therapy

## 2015-11-16 DIAGNOSIS — R531 Weakness: Secondary | ICD-10-CM | POA: Insufficient documentation

## 2015-11-16 DIAGNOSIS — R293 Abnormal posture: Secondary | ICD-10-CM | POA: Insufficient documentation

## 2015-11-16 DIAGNOSIS — M25611 Stiffness of right shoulder, not elsewhere classified: Secondary | ICD-10-CM | POA: Diagnosis not present

## 2015-11-16 DIAGNOSIS — M25511 Pain in right shoulder: Secondary | ICD-10-CM

## 2015-11-16 NOTE — Therapy (Signed)
[place], [place], [address] Phone: [phone]   Fax:  [phone]  Physical Therapy Evaluation  Patient Details  Name: Christy Price MRN: [id number] Date of Birth: Mar 13, 1970 Referring Provider: Meridee Score MD  Encounter Date: 11/16/2015      PT End of Session - 11/16/15 1755    Visit Number 1   Number of Visits 17   Date for PT Re-Evaluation 01/11/16   Authorization Type Medicare: Kx mod by 15, Progress note by 10th visit   PT Start Time 1545   PT Stop Time 1638   PT Time Calculation (min) 53 min   Activity Tolerance Patient tolerated treatment well   Behavior During Therapy [facility] for tasks assessed/performed      Past Medical History:  Diagnosis Date  . Cancer (HCC)     hx cervical cx   . Hypertension   . Meningitis     Past Surgical History:  Procedure Laterality Date  . SHUNT REPLACEMENT      There were no vitals filed for this visit.       Subjective Assessment - 11/16/15 1607    Subjective pt is a 44 y.o F with CC of R shoulder pain that started following falling in the shoulder 2 months ago. repors pain starts in the top of the shoulder and refers to down to the outside of the mid arm. reports numbness and tingling in the shoulder in the collar bone that is relieved with rubbing. Since the incident the pain seems to stay about the same.    Limitations Lifting;House hold activities;Other (comment)  opening the door   How long can you sit comfortably? --   How long can you stand comfortably? --   Diagnostic tests x-rays at urgent care   Patient Stated Goals lift/ carry better, decrease pain, improve function   Currently in Pain? Yes   Pain Score 8   took ibuprofen for pain today    Pain Location Shoulder   Pain Orientation Right   Pain Descriptors / Indicators Burning;Aching   Pain Type Chronic pain   Pain Radiating Towards from top the shoulder to the lateral humerus   Pain  Onset More than a month ago   Pain Frequency Intermittent   Aggravating Factors  lifting, carrying, pulling the door open    Pain Relieving Factors ibuprofen, icey hot, biofreeze, heat,             [facility] PT Assessment - 11/16/15 1617      Assessment   Medical Diagnosis R shoulder impingement   Referring Provider Meridee Score MD   Onset Date/Surgical Date --  2 months ago   Hand Dominance Left   Next MD Visit 12/06/2015     Precautions   Precautions None     Restrictions   Weight Bearing Restrictions No     Balance Screen   Has the patient fallen in the past 6 months Yes   How many times? 1   Has the patient had a decrease in activity level because of a fear of falling?  No   Is the patient reluctant to leave their home because of a fear of falling?  No     Home Environment   Living Environment Private residence   Living Arrangements Spouse/significant other   Type of [place] to enter   [place] One level     Prior Function   Level  of Independence Independent with basic ADLs   Vocation Unemployed     Cognition   Overall Cognitive Status Within Functional Limits for tasks assessed     Observation/Other Assessments   Focus on Therapeutic Outcomes (FOTO)  55% limited  predicted 37% limited     Posture/Postural Control   Posture/Postural Control Postural limitations   Postural Limitations Rounded Shoulders;Forward head;Increased thoracic kyphosis     ROM / Strength   AROM / PROM / Strength AROM;PROM;Strength     AROM   AROM Assessment Site Shoulder   Right/Left Shoulder Right;Left   Right Shoulder Extension 48 Degrees  ERP   Right Shoulder Flexion 136 Degrees  ERP   Right Shoulder ABduction 108 Degrees  ERP   Right Shoulder Internal Rotation 10 Degrees  tested at 90/90,  ERP   Right Shoulder External Rotation 40 Degrees  ERP, tested 90/90   Left Shoulder Extension 50 Degrees   Left Shoulder Flexion 150 Degrees   Left  Shoulder ABduction 118 Degrees   Left Shoulder Internal Rotation 65 Degrees  tested 90/90   Left Shoulder External Rotation 70 Degrees  tested at 90/90     Strength   Strength Assessment Site Shoulder;Hand   Right/Left Shoulder Right;Left   Right Shoulder Flexion 3+/5   Right Shoulder Extension 3+/5   Right Shoulder ABduction 3+/5   Right Shoulder Internal Rotation 3+/5   Right Shoulder External Rotation 3+/5   Left Shoulder Flexion 4-/5   Left Shoulder Extension 4-/5   Left Shoulder ABduction 4-/5   Left Shoulder Internal Rotation 4-/5   Left Shoulder External Rotation 4-/5     Palpation   Palpation comment soreness at the supraspinatus, the long head of the biceps tendon, at the greater tubercle with referral to the deltoid tubercle. tightness in the upper trap and levator scapulae with multiple palpable trigger points.     Special Tests    Special Tests Rotator Cuff Impingement   Rotator Cuff Impingment tests Neer impingement test;Hawkins- Merrilyn Puma test;Empty Can test;Full Can test;other2     Neer Impingement test    Findings Positive   Side Right     Hawkins-Kennedy test   Findings Positive   Side Right     Empty Can test   Findings Positive   Side Right   Comment worse than full can testing     Full Can test   Findings Positive   Side Right     Other    Findings Positive   Side Right   Comment scapular assist testing                           PT Education - 11/16/15 1754    Education provided Yes   Education Details evaluation findings, POC, goals, HEP with proper form and rationale, scapulohumeral rhthym and causes of impingement and structures that can be impinged.    Person(s) Educated Patient;Spouse;Other (comment)  interpreter   Methods Explanation;Verbal cues;Handout;Demonstration   Comprehension Verbalized understanding;Verbal cues required;Tactile cues required          PT Short Term Goals - 11/16/15 1802      PT SHORT  TERM GOAL #1   Title pt will be I with inial HEP (12/16/2015)   Time 4   Period Weeks   Status New     PT SHORT TERM GOAL #2   Title pt will be able to demo proper posture/ lifting and carrying techniques to reduce and prevent  R shoulder pain (12/16/2015)   Time 4   Period Weeks   Status New     PT SHORT TERM GOAL #3   Title pt will improve R shoulder flexion/ abduction  internal/ external rotation by >/= 10 degrees with </= 7/10 pain to assist with functional progression (12/16/2015)   Time 4   Period Weeks   Status New     PT SHORT TERM GOAL #4   Title pt will improve R shoulder strength to >/= 4-/5 with </= 6/10 pain to promote functional mobility (12/16/2015)   Time 4   Period Weeks   Status New           PT Long Term Goals - 11/16/15 1806      PT LONG TERM GOAL #1   Title pt will be I with all HEP issued as of last visit (01/11/2016)   Time 8   Period Weeks   Status New     PT LONG TERM GOAL #2   Title pt will improve R shoulder mobility by >/= 15 degrees in all planes with </= 4/10 pain for functional mobility required for ADLs (01/11/2016)   Time 8   Period Weeks   Status New     PT LONG TERM GOAL #3   Title pt will improve R shoulder strength to >/= 4/5 in all planes for lifting and carrying activities (01/11/2016)   Time 8   Period Weeks   Status New     PT LONG TERM GOAL #4   Title pt will be able to push/pull >/= 10# and  lift/ lower >/= 8# with </= 4/10 pain from an overhead shelf to assist with functional tasks including opening doors reaching into cabinets, and other functional ADLS ([id number])   Period Weeks   Status New     PT LONG TERM GOAL #5   Title pt with increase FOTO score to </= 37% limitation to demonstrate improvement in function at discharge (01/11/2016)   Time 8   Period Weeks   Status New               Plan - 11/16/15 1756    Clinical Impression Statement pt presents to OPPT as a Moderate complexity evaluation due to  requirment of interpreter for hearing impairment, medical hx, and fluctuating pain inthe shoulder that started from fallin inthe shower over 2 months ago. mild limited shoulder AROM with ERP and weakness due to pain upon testing. special testing was consistent with dx of impingement of the R shoulder. She would benefit from phyiscal therapy to improve shoulder mobility, strength, reduce pain and return pt to PLOF by addressing the defecits listed.    Rehab Potential Good   PT Frequency 2x / week   PT Duration 8 weeks   PT Next Visit Plan assess/ review HEP, soft tissue work to clam down to tightness, scapular stabilizers, thoracic mobility, assess grip strength.    PT Home Exercise Plan Rows, shoulder internal strengthening, shoulder external rotation, upper trap stretching   Consulted and Agree with Plan of Care Patient;Family member/caregiver   Family Member Consulted husband      Patient will benefit from skilled therapeutic intervention in order to improve the following deficits and impairments:  Pain, Decreased endurance, Decreased activity tolerance, Decreased strength, Increased fascial restricitons, Decreased range of motion, Improper body mechanics, Postural dysfunction, Increased edema  Visit Diagnosis: Pain in right shoulder - Plan: PT plan of care cert/re-cert  Stiffness of right shoulder, not elsewhere  classified - Plan: PT plan of care cert/re-cert  General weakness - Plan: PT plan of care cert/re-cert  Abnormal posture - Plan: PT plan of care cert/re-cert      G-Codes - 0000000 1814    Functional Assessment Tool Used FOTO/ clinical judgement   Functional Limitation Carrying, moving and handling objects   Carrying, Moving and Handling Objects Current Status [id number]) At least 40 percent but less than 60 percent impaired, limited or restricted   Carrying, Moving and Handling Objects Goal Status [id number]) At least 20 percent but less than 40 percent impaired, limited or  restricted       Problem List Patient Active Problem List   Diagnosis Date Noted  . Nail fungus 03/30/2015  . Toe pain 03/30/2015  . Insomnia 12/19/2014  . Pedal edema 11/06/2014  . Depression 11/06/2014  . Genital herpes 11/06/2014  . Back pain 11/06/2014   Starr Lake PT, DPT, LAT, ATC  11/16/15  6:17 PM      [facility] [address] [place], [place], [address] Phone: [phone]   Fax:  [phone]  Name: Christy Price MRN: [id number] Date of Birth: 1970/03/31

## 2015-11-22 ENCOUNTER — Other Ambulatory Visit: Payer: Self-pay | Admitting: Family Medicine

## 2015-11-28 ENCOUNTER — Ambulatory Visit: Payer: Commercial Managed Care - HMO | Admitting: Physical Therapy

## 2015-11-28 DIAGNOSIS — R293 Abnormal posture: Secondary | ICD-10-CM | POA: Diagnosis not present

## 2015-11-28 DIAGNOSIS — M25611 Stiffness of right shoulder, not elsewhere classified: Secondary | ICD-10-CM | POA: Diagnosis not present

## 2015-11-28 DIAGNOSIS — M25511 Pain in right shoulder: Secondary | ICD-10-CM

## 2015-11-28 DIAGNOSIS — R531 Weakness: Secondary | ICD-10-CM | POA: Diagnosis not present

## 2015-11-28 NOTE — Therapy (Signed)
[place], [place], [address] Phone: [phone]   Fax:  [phone]  Physical Therapy Treatment  Patient Details  Name: Christy Price MRN: [id number] Date of Birth: 1970/06/15 Referring Provider: Meridee Score MD  Encounter Date: 11/28/2015      PT End of Session - 11/28/15 1758    Visit Number 2   Number of Visits 17   Date for PT Re-Evaluation 01/11/16   PT Start Time 1500   PT Stop Time 1545   PT Time Calculation (min) 45 min   Activity Tolerance Patient tolerated treatment well   Behavior During Therapy [facility] for tasks assessed/performed      Past Medical History:  Diagnosis Date  . Cancer (HCC)     hx cervical cx   . Hypertension   . Meningitis     Past Surgical History:  Procedure Laterality Date  . SHUNT REPLACEMENT      There were no vitals filed for this visit.      Subjective Assessment - 11/28/15 1506    Subjective pain wakes her at night.  5/10 now.  Some exercises hurt.  after 15-minutes.    Currently in Pain? Yes   Pain Score 5    Pain Location Shoulder   Pain Orientation Right;Posterior   Pain Descriptors / Indicators Shooting;Tingling;Sharp   Pain Radiating Towards sapular, to elbow,  to hand intermittantly   Aggravating Factors  washing dishes,  picking cup overhead in cupboard.   Pain Relieving Factors medication   Effect of Pain on Daily Activities wakes            [facility] PT Assessment - 11/28/15 0001      Strength   Right/Left hand Right;Left   Right Hand Grip (lbs) 20.66 LBS  20, 22, 20   Left Hand Grip (lbs) 25.66  28, 24, 25                     [facility] Adult PT Treatment/Exercise - 11/28/15 0001      Self-Care   Self-Care Posture   Posture practiced sitting, standing posture,  education continued for impingement,  extra time spent     Shoulder Exercises: Seated   Retraction 5 reps  5 seconds,  moderate cues     Shoulder Exercises:  Standing   Row 10 reps   Theraband Level (Shoulder Row) Level 1 (Yellow)   Row Limitations cues, posture     Manual Therapy   Manual therapy comments Instrument assist soft tissue work upper trap, Pecs,  deltoid, rhomboids paraspinals teres /  Sensitive initially,  when i explained it would be uncomfortable, but helpful she agrees to soft tissue work vs moist heat. option.  pain decreased to 1/10 post manual.      Neck Exercises: Stretches   Upper Trapezius Stretch 1 rep;10 seconds   Levator Stretch 1 rep;10 seconds  cues to move to target area of tightness.                 PT Education - 11/28/15 1758    Education provided Yes   Education Details posture, shoulder anatomy, impingement,  resting posture,  exercise cues   Person(s) Educated Patient;Spouse   Methods Explanation;Demonstration;Verbal cues   Comprehension Verbalized understanding;Returned demonstration          PT Short Term Goals - 11/28/15 1803      PT SHORT TERM GOAL #1   Title pt will be I with  inial HEP (12/16/2015)   Baseline cues needed   Time 4   Status On-going     PT SHORT TERM GOAL #2   Title pt will be able to demo proper posture/ lifting and carrying techniques to reduce and prevent R shoulder pain (12/16/2015)   Baseline beginning education   Time 4   Period Weeks   Status On-going     PT SHORT TERM GOAL #3   Title pt will improve R shoulder flexion/ abduction  internal/ external rotation by >/= 10 degrees with </= 7/10 pain to assist with functional progression (12/16/2015)   Time 4   Period Weeks   Status Unable to assess     PT SHORT TERM GOAL #4   Title pt will improve R shoulder strength to >/= 4-/5 with </= 6/10 pain to promote functional mobility (12/16/2015)   Time 4   Period Weeks   Status Unable to assess           PT Long Term Goals - 11/16/15 1806      PT LONG TERM GOAL #1   Title pt will be I with all HEP issued as of last visit (01/11/2016)   Time 8   Period  Weeks   Status New     PT LONG TERM GOAL #2   Title pt will improve R shoulder mobility by >/= 15 degrees in all planes with </= 4/10 pain for functional mobility required for ADLs (01/11/2016)   Time 8   Period Weeks   Status New     PT LONG TERM GOAL #3   Title pt will improve R shoulder strength to >/= 4/5 in all planes for lifting and carrying activities (01/11/2016)   Time 8   Period Weeks   Status New     PT LONG TERM GOAL #4   Title pt will be able to push/pull >/= 10# and  lift/ lower >/= 8# with </= 4/10 pain from an overhead shelf to assist with functional tasks including opening doors reaching into cabinets, and other functional ADLS (05/13/7423)   Period Weeks   Status New     PT LONG TERM GOAL #5   Title pt with increase FOTO score to </= 37% limitation to demonstrate improvement in function at discharge (01/11/2016)   Time 8   Period Weeks   Status New               Plan - 11/28/15 1759    Clinical Impression Statement Extra time needed due to language barrier.  Interperter was excellent.  Patient was doing rows 10-15 minutes vs 10-15 reps at home and thjese were increasing her pain.  Pain 1/10 post session.  No new goals met.  Grip RT 20.66,  LT 25.66 pounds.   PT Next Visit Plan rewiew and progress home exercises.    PT Home Exercise Plan continue   Consulted and Agree with Plan of Care Patient;Family member/caregiver   Family Member Consulted husband      Patient will benefit from skilled therapeutic intervention in order to improve the following deficits and impairments:  Pain, Decreased endurance, Decreased activity tolerance, Decreased strength, Increased fascial restricitons, Decreased range of motion, Improper body mechanics, Postural dysfunction, Increased edema  Visit Diagnosis: Pain in right shoulder  Stiffness of right shoulder, not elsewhere classified  General weakness  Abnormal posture     Problem List Patient Active Problem List    Diagnosis Date Noted  . Nail fungus 03/30/2015  .  Toe pain 03/30/2015  . Insomnia 12/19/2014  . Pedal edema 11/06/2014  . Depression 11/06/2014  . Genital herpes 11/06/2014  . Back pain 11/06/2014    Yerik Zeringue PTA 11/28/2015, 6:05 PM  [facility] [address] [place], [place], [address] Phone: [phone]   Fax:  [phone]  Name: Christy Price MRN: [id number] Date of Birth: [birth year]

## 2015-11-29 ENCOUNTER — Ambulatory Visit: Payer: Commercial Managed Care - HMO | Admitting: Physical Therapy

## 2015-12-05 ENCOUNTER — Ambulatory Visit: Payer: Commercial Managed Care - HMO | Admitting: Physical Therapy

## 2015-12-05 DIAGNOSIS — M25611 Stiffness of right shoulder, not elsewhere classified: Secondary | ICD-10-CM

## 2015-12-05 DIAGNOSIS — M25511 Pain in right shoulder: Secondary | ICD-10-CM | POA: Diagnosis not present

## 2015-12-05 DIAGNOSIS — R293 Abnormal posture: Secondary | ICD-10-CM

## 2015-12-05 DIAGNOSIS — R531 Weakness: Secondary | ICD-10-CM | POA: Diagnosis not present

## 2015-12-05 NOTE — Therapy (Signed)
[place], [place], [address] Phone: [phone]   Fax:  [phone]  Physical Therapy Treatment  Patient Details  Name: Christy Price MRN: [id number] Date of Birth: March 24, 1970 Referring Provider: Meridee Score MD  Encounter Date: 12/05/2015      PT End of Session - 12/05/15 1648    Visit Number 3   Number of Visits 17   Date for PT Re-Evaluation 01/11/16   Authorization Type Medicare: Kx mod by 15, Progress note by 10th visit   PT Start Time 1546   PT Stop Time 1634   PT Time Calculation (min) 48 min   Activity Tolerance Patient tolerated treatment well   Behavior During Therapy [facility] for tasks assessed/performed      Past Medical History:  Diagnosis Date  . Cancer (HCC)     hx cervical cx   . Hypertension   . Meningitis     Past Surgical History:  Procedure Laterality Date  . SHUNT REPLACEMENT      There were no vitals filed for this visit.      Subjective Assessment - 12/05/15 1550    Subjective " shoulder is on and off pain, my feet are giving me more in the heel"   Currently in Pain? Yes   Pain Score 6    Pain Location Shoulder   Pain Orientation Right   Pain Descriptors / Indicators Aching;Sore   Pain Type Chronic pain   Pain Onset More than a month ago   Pain Frequency Intermittent   Aggravating Factors  was dishes, cleaning the tub "circular motions   Pain Relieving Factors medication, stretches,                          [facility] Adult PT Treatment/Exercise - 12/05/15 0001      Manual Therapy   Manual Therapy Joint mobilization;Neural Stretch;Myofascial release;Soft tissue mobilization;Scapular mobilization   Joint Mobilization distal clavicle mobs inferior / posterior   increased pain, halted treatment   Soft tissue mobilization IASTM over the R upper trap    Myofascial Release manual trigger point release x 3 over R upper trap   Scapular Mobilization  scapular mobs grade 3 in all plans  multiple verbal cues to relax, pt had difficulty relaxing   Neural Stretch median nerve glides 2 x 10  given as HEP     Neck Exercises: Stretches   Upper Trapezius Stretch 2 reps;20 seconds   Levator Stretch 2 reps;20 seconds                PT Education - 12/05/15 1646    Education provided Yes   Education Details updated HEP for nerve glides utilizing proper form, extra time needed for intrepretation   Person(s) Educated Patient;Spouse   Methods Explanation;Verbal cues;Handout;Tactile cues   Comprehension Verbalized understanding;Verbal cues required          PT Short Term Goals - 11/28/15 1803      PT SHORT TERM GOAL #1   Title pt will be I with inial HEP (12/16/2015)   Baseline cues needed   Time 4   Status On-going     PT SHORT TERM GOAL #2   Title pt will be able to demo proper posture/ lifting and carrying techniques to reduce and prevent R shoulder pain (12/16/2015)   Baseline beginning education   Time 4   Period Weeks   Status On-going     PT  SHORT TERM GOAL #3   Title pt will improve R shoulder flexion/ abduction  internal/ external rotation by >/= 10 degrees with </= 7/10 pain to assist with functional progression (12/16/2015)   Time 4   Period Weeks   Status Unable to assess     PT SHORT TERM GOAL #4   Title pt will improve R shoulder strength to >/= 4-/5 with </= 6/10 pain to promote functional mobility (12/16/2015)   Time 4   Period Weeks   Status Unable to assess           PT Long Term Goals - 11/16/15 1806      PT LONG TERM GOAL #1   Title pt will be I with all HEP issued as of last visit (01/11/2016)   Time 8   Period Weeks   Status New     PT LONG TERM GOAL #2   Title pt will improve R shoulder mobility by >/= 15 degrees in all planes with </= 4/10 pain for functional mobility required for ADLs (01/11/2016)   Time 8   Period Weeks   Status New     PT LONG TERM GOAL #3   Title pt will improve R  shoulder strength to >/= 4/5 in all planes for lifting and carrying activities (01/11/2016)   Time 8   Period Weeks   Status New     PT LONG TERM GOAL #4   Title pt will be able to push/pull >/= 10# and  lift/ lower >/= 8# with </= 4/10 pain from an overhead shelf to assist with functional tasks including opening doors reaching into cabinets, and other functional ADLS ([id number])   Period Weeks   Status New     PT LONG TERM GOAL #5   Title pt with increase FOTO score to </= 37% limitation to demonstrate improvement in function at discharge (01/11/2016)   Time 8   Period Weeks   Status New               Plan - 12/05/15 1727    Clinical Impression Statement focused todays session on manual to calm down tightness of the R upper trap with manual trigger point relesae and IASTM. performed scapular mobility clavicle mobs with pt continued to report tingling during treatment. upon further assessment pt demonstrated increased median nerve tension. following neural flossing / glides she reported pain dropped to 4/10 today.   extra time needed for intrepretation for sign language   PT Next Visit Plan attempt 1st rib mobs, nueral flossing with median vs ulnar, scapular mobility, thoracic mobilty, cervical mobs (assess distraction)   PT Home Exercise Plan median nerve glides   Consulted and Agree with Plan of Care Patient      Patient will benefit from skilled therapeutic intervention in order to improve the following deficits and impairments:  Pain, Decreased endurance, Decreased activity tolerance, Decreased strength, Increased fascial restricitons, Decreased range of motion, Improper body mechanics, Postural dysfunction, Increased edema  Visit Diagnosis: Pain in right shoulder  Stiffness of right shoulder, not elsewhere classified  General weakness  Abnormal posture     Problem List Patient Active Problem List   Diagnosis Date Noted  . Nail fungus 03/30/2015  . Toe pain  03/30/2015  . Insomnia 12/19/2014  . Pedal edema 11/06/2014  . Depression 11/06/2014  . Genital herpes 11/06/2014  . Back pain 11/06/2014   Starr Lake PT, DPT, LAT, ATC  12/05/15  5:45 PM      [facility] [address] [place], [place], [address] Phone: [phone]   Fax:  [phone]  Name: Christy Price MRN: [id number] Date of Birth: [birth year]

## 2015-12-07 ENCOUNTER — Ambulatory Visit: Payer: Commercial Managed Care - HMO | Admitting: Physical Therapy

## 2015-12-07 DIAGNOSIS — R531 Weakness: Secondary | ICD-10-CM

## 2015-12-07 DIAGNOSIS — R293 Abnormal posture: Secondary | ICD-10-CM | POA: Diagnosis not present

## 2015-12-07 DIAGNOSIS — M25611 Stiffness of right shoulder, not elsewhere classified: Secondary | ICD-10-CM | POA: Diagnosis not present

## 2015-12-07 DIAGNOSIS — M25511 Pain in right shoulder: Secondary | ICD-10-CM

## 2015-12-07 NOTE — Therapy (Signed)
[place], [place], [address] Phone: [phone]   Fax:  [phone]  Physical Therapy Treatment  Patient Details  Name: Christy Price MRN: [id number] Date of Birth: 1971/02/21 Referring Provider: Meridee Score MD  Encounter Date: 12/07/2015      PT End of Session - 12/07/15 1814    Visit Number 4   Number of Visits 17   Date for PT Re-Evaluation 01/11/16   PT Start Time [id number]   PT Stop Time 1635   PT Time Calculation (min) 50 min   Activity Tolerance Patient tolerated treatment well   Behavior During Therapy [facility] for tasks assessed/performed      Past Medical History:  Diagnosis Date  . Cancer (HCC)     hx cervical cx   . Hypertension   . Meningitis     Past Surgical History:  Procedure Laterality Date  . SHUNT REPLACEMENT      There were no vitals filed for this visit.      Subjective Assessment - 12/07/15 1553    Subjective Felt good in clinic last visit.  It lastedabout 2 hours later.  IT started hurting and it ahs continued.     Patient is accompained by: Family member;Interpreter   Currently in Pain? Yes   Pain Score 10-Worst pain ever   Pain Location Shoulder   Pain Orientation Right   Pain Descriptors / Indicators Tingling;Tiring   Pain Radiating Towards down arm   Pain Frequency Intermittent   Aggravating Factors  using right arm.    Pain Relieving Factors medications            [facility] PT Assessment - 12/07/15 0001      Strength   Right Hand Grip (lbs) 27.6                     [facility] Adult PT Treatment/Exercise - 12/07/15 0001      Manual Therapy   Manual Therapy --  soft tissue work  to lateral neck shoulder,  peri scapula   Manual therapy comments PROM forearm supination/ pronation  increased numbness into arm,  better after she shaked arm  Lymph system vacume activation Rt neck = 1/10 pain   Joint Mobilization 1st rib mobilization 3 X light due to  sensitivity.    Myofascial Release Scalenes trigger point release 1 X about 60 seconds light pressure   Scapular Mobilization With AA reaching in sidelying 5 X tender to touch    Neural Stretch attempted painful                   PT Short Term Goals - 11/28/15 1803      PT SHORT TERM GOAL #1   Title pt will be I with inial HEP (12/16/2015)   Baseline cues needed   Time 4   Status On-going     PT SHORT TERM GOAL #2   Title pt will be able to demo proper posture/ lifting and carrying techniques to reduce and prevent R shoulder pain (12/16/2015)   Baseline beginning education   Time 4   Period Weeks   Status On-going     PT SHORT TERM GOAL #3   Title pt will improve R shoulder flexion/ abduction  internal/ external rotation by >/= 10 degrees with </= 7/10 pain to assist with functional progression (12/16/2015)   Time 4   Period Weeks   Status Unable to assess     PT  SHORT TERM GOAL #4   Title pt will improve R shoulder strength to >/= 4-/5 with </= 6/10 pain to promote functional mobility (12/16/2015)   Time 4   Period Weeks   Status Unable to assess           PT Long Term Goals - 11/16/15 1806      PT LONG TERM GOAL #1   Title pt will be I with all HEP issued as of last visit (01/11/2016)   Time 8   Period Weeks   Status New     PT LONG TERM GOAL #2   Title pt will improve R shoulder mobility by >/= 15 degrees in all planes with </= 4/10 pain for functional mobility required for ADLs (01/11/2016)   Time 8   Period Weeks   Status New     PT LONG TERM GOAL #3   Title pt will improve R shoulder strength to >/= 4/5 in all planes for lifting and carrying activities (01/11/2016)   Time 8   Period Weeks   Status New     PT LONG TERM GOAL #4   Title pt will be able to push/pull >/= 10# and  lift/ lower >/= 8# with </= 4/10 pain from an overhead shelf to assist with functional tasks including opening doors reaching into cabinets, and other functional ADLS  ([id number])   Period Weeks   Status New     PT LONG TERM GOAL #5   Title pt with increase FOTO score to </= 37% limitation to demonstrate improvement in function at discharge (01/11/2016)   Time 8   Period Weeks   Status New               Plan - 12/07/15 1815    Clinical Impression Statement pain decreased to 1/10 at end of session.  Lymph system helped the most today.  GRIP strength averaged 27.6 pounds today.  (Improved from 22.66)   PT Next Visit Plan attempt 1st rib mobs, nueral flossing with median vs ulnar, scapular mobility, thoracic mobilty, cervical mobs (assess distraction)  Tape for impingement   PT Home Exercise Plan continue   Consulted and Agree with Plan of Care Family member/caregiver   Family Member Consulted husband      Patient will benefit from skilled therapeutic intervention in order to improve the following deficits and impairments:  Pain, Decreased endurance, Decreased activity tolerance, Decreased strength, Increased fascial restricitons, Decreased range of motion, Improper body mechanics, Postural dysfunction, Increased edema  Visit Diagnosis: Pain in right shoulder  Stiffness of right shoulder, not elsewhere classified  General weakness  Abnormal posture     Problem List Patient Active Problem List   Diagnosis Date Noted  . Nail fungus 03/30/2015  . Toe pain 03/30/2015  . Insomnia 12/19/2014  . Pedal edema 11/06/2014  . Depression 11/06/2014  . Genital herpes 11/06/2014  . Back pain 11/06/2014    HARRIS,KARENPTA 12/07/2015, 6:18 PM  [facility] [address] [place], [place], [address] Phone: [phone]   Fax:  [phone]  Name: ENRIQUE RADOVIC MRN: [id number] Date of Birth: [birth year]

## 2015-12-12 ENCOUNTER — Ambulatory Visit: Payer: Commercial Managed Care - HMO | Attending: Orthopedic Surgery | Admitting: Physical Therapy

## 2015-12-12 DIAGNOSIS — R531 Weakness: Secondary | ICD-10-CM | POA: Insufficient documentation

## 2015-12-12 DIAGNOSIS — M25511 Pain in right shoulder: Secondary | ICD-10-CM | POA: Insufficient documentation

## 2015-12-12 DIAGNOSIS — R293 Abnormal posture: Secondary | ICD-10-CM | POA: Diagnosis not present

## 2015-12-12 DIAGNOSIS — M25611 Stiffness of right shoulder, not elsewhere classified: Secondary | ICD-10-CM | POA: Diagnosis not present

## 2015-12-12 DIAGNOSIS — G8929 Other chronic pain: Secondary | ICD-10-CM | POA: Insufficient documentation

## 2015-12-12 NOTE — Patient Instructions (Signed)
Remove tape if irritating 

## 2015-12-12 NOTE — Therapy (Signed)
[place], [place], [address] Phone: [phone]   Fax:  [phone]  Physical Therapy Treatment  Patient Details  Name: Christy Price MRN: [id number] Date of Birth: [birth year] Referring Provider: Meridee Score MD  Encounter Date: 12/12/2015      PT End of Session - 12/12/15 1805    Visit Number 5   Number of Visits 17   Date for PT Re-Evaluation 01/11/16   PT Start Time 2202   PT Stop Time 1635   PT Time Calculation (min) 38 min   Behavior During Therapy [facility] for tasks assessed/performed      Past Medical History:  Diagnosis Date  . Cancer (HCC)     hx cervical cx   . Hypertension   . Meningitis     Past Surgical History:  Procedure Laterality Date  . SHUNT REPLACEMENT      There were no vitals filed for this visit.      Subjective Assessment - 12/12/15 1755    Subjective Last session helped a lot,  I am able to reach better.    Patient is accompained by: Interpreter   Currently in Pain? Yes   Pain Score 5    Pain Location Shoulder   Pain Orientation Right   Pain Descriptors / Indicators Tightness;Tingling   Pain Radiating Towards down arm   Pain Frequency Intermittent   Aggravating Factors  sleeping on arm,  using right arm,  lifting a heavy person into a car   Pain Relieving Factors PT, soft tissue work   Effect of Pain on Daily Activities wakes,  painful reaches   Multiple Pain Sites No            [facility] PT Assessment - 12/12/15 0001      AROM   Right Shoulder Flexion 120 Degrees   Right Shoulder ABduction 110 Degrees                     [facility] Adult PT Treatment/Exercise - 12/12/15 0001      Manual Therapy   Soft tissue mobilization Instrument assist soft tissue work shoulder scapular area No trigger point release today.  Light pressure tolerated.  tissue softened with manual   Neural Stretch Flossing VS neural stretch , needed cues Tingling with one  motion and not the other.  Unspecified.    [place];Inhibit Muscle  2 fans for upper back,  anterior shoulder to create space,     Kinesiotix   Edema upper back   Create Space anterior shoulder   Inhibit Muscle  deltoid, supraspinatus.    Also, posture correction                  PT Short Term Goals - 12/12/15 1808      PT SHORT TERM GOAL #1   Title pt will be I with inial HEP (12/16/2015)   Baseline neural floss needs cues   Time 4   Period Weeks   Status On-going     PT SHORT TERM GOAL #2   Title pt will be able to demo proper posture/ lifting and carrying techniques to reduce and prevent R shoulder pain (12/16/2015)   Time 4   Period Weeks   Status On-going     PT SHORT TERM GOAL #3   Title pt will improve R shoulder flexion/ abduction  internal/ external rotation by >/= 10 degrees with </= 7/10 pain to assist with functional progression (12/16/2015)  Baseline 150 flexion,    Time 4   Period Weeks   Status On-going     PT SHORT TERM GOAL #4   Title pt will improve R shoulder strength to >/= 4-/5 with </= 6/10 pain to promote functional mobility (12/16/2015)   Time 4   Period Weeks   Status Unable to assess           PT Long Term Goals - 11/16/15 1806      PT LONG TERM GOAL #1   Title pt will be I with all HEP issued as of last visit (01/11/2016)   Time 8   Period Weeks   Status New     PT LONG TERM GOAL #2   Title pt will improve R shoulder mobility by >/= 15 degrees in all planes with </= 4/10 pain for functional mobility required for ADLs (01/11/2016)   Time 8   Period Weeks   Status New     PT LONG TERM GOAL #3   Title pt will improve R shoulder strength to >/= 4/5 in all planes for lifting and carrying activities (01/11/2016)   Time 8   Period Weeks   Status New     PT LONG TERM GOAL #4   Title pt will be able to push/pull >/= 10# and  lift/ lower >/= 8# with </= 4/10 pain from an overhead shelf to assist with  functional tasks including opening doors reaching into cabinets, and other functional ADLS (75/0/5183)   Period Weeks   Status New     PT LONG TERM GOAL #5   Title pt with increase FOTO score to </= 37% limitation to demonstrate improvement in function at discharge (01/11/2016)   Time 8   Period Weeks   Status New               Plan - 12/12/15 1805    Clinical Impression Statement Shoulder flexion 150 post taping.  Manual continued.  PT seems to be helping her reach more comfortably.  She was noted to have a purple bruise triceps ares 1" X .75 inch approximately.  No new goals met   PT Next Visit Plan attempt 1st rib mobs, nueral flossing with median vs ulnar, scapular mobility, thoracic mobilty, cervical mobs (assess distraction)  Assess Tape for impingementcheck specific goals.   PT Home Exercise Plan continue   Consulted and Agree with Plan of Care Patient      Patient will benefit from skilled therapeutic intervention in order to improve the following deficits and impairments:  Pain, Decreased endurance, Decreased activity tolerance, Decreased strength, Increased fascial restricitons, Decreased range of motion, Improper body mechanics, Postural dysfunction, Increased edema  Visit Diagnosis: Right shoulder pain, unspecified chronicity  Stiffness of right shoulder, not elsewhere classified  General weakness  Abnormal posture     Problem List Patient Active Problem List   Diagnosis Date Noted  . Nail fungus 03/30/2015  . Toe pain 03/30/2015  . Insomnia 12/19/2014  . Pedal edema 11/06/2014  . Depression 11/06/2014  . Genital herpes 11/06/2014  . Back pain 11/06/2014    Chemere Steffler PTA 12/12/2015, 6:09 PM  [facility] [address] [place], [place], [address] Phone: [phone]   Fax:  [phone]  Name: Christy Price MRN: [id number] Date of Birth: [birth year]

## 2015-12-14 ENCOUNTER — Ambulatory Visit: Payer: Commercial Managed Care - HMO | Admitting: Physical Therapy

## 2015-12-19 ENCOUNTER — Ambulatory Visit: Payer: Commercial Managed Care - HMO | Admitting: Physical Therapy

## 2015-12-19 DIAGNOSIS — M25611 Stiffness of right shoulder, not elsewhere classified: Secondary | ICD-10-CM

## 2015-12-19 DIAGNOSIS — G8929 Other chronic pain: Secondary | ICD-10-CM | POA: Diagnosis not present

## 2015-12-19 DIAGNOSIS — R293 Abnormal posture: Secondary | ICD-10-CM

## 2015-12-19 DIAGNOSIS — M25511 Pain in right shoulder: Secondary | ICD-10-CM | POA: Diagnosis not present

## 2015-12-19 DIAGNOSIS — R531 Weakness: Secondary | ICD-10-CM

## 2015-12-19 NOTE — Therapy (Signed)
[place], [place], [address] Phone: [phone]   Fax:  [phone]  Physical Therapy Treatment  Patient Details  Name: Christy Price MRN: [id number] Date of Birth: Mar 30, 1970 Referring Provider: Meridee Score MD  Encounter Date: 12/19/2015      PT End of Session - 12/19/15 1745    Visit Number 6   Number of Visits 17   Date for PT Re-Evaluation 01/11/16   Authorization Type Medicare: Kx mod by 15, Progress note by 10th visit   PT Start Time 1545   PT Stop Time 1631   PT Time Calculation (min) 46 min   Activity Tolerance Patient tolerated treatment well   Behavior During Therapy [facility] for tasks assessed/performed      Past Medical History:  Diagnosis Date  . Cancer (HCC)     hx cervical cx   . Hypertension   . Meningitis     Past Surgical History:  Procedure Laterality Date  . SHUNT REPLACEMENT      There were no vitals filed for this visit.      Subjective Assessment - 12/19/15 1548    Subjective "the taping helped last time, still having catching"   Currently in Pain? Yes   Pain Score 7    Pain Orientation Right   Pain Descriptors / Indicators Cramping;Pins and needles   Pain Type Chronic pain   Pain Onset More than a month ago   Pain Frequency Intermittent   Aggravating Factors  sleeping on the arm,    Pain Relieving Factors pt, soft tisee work                          [employer] Adult PT Treatment/Exercise - 12/19/15 0001      Neck Exercises: Supine   Neck Retraction 10 reps;5 secs     Manual Therapy   Joint Mobilization 1st rib mobs, gapping mobilizations to the R grade 2-3, cervical distraction grade 1 for pain   Myofascial Release Scalenes trigger point release 1 X about 60 seconds light pressure   Neural Stretch Flossing VS neural stretch , needed cues Tingling with one motion and not the other.  Unspecified.      Kinesiotix   Edema upper back   Create  Space anterior shoulder   Inhibit Muscle  deltoid, supraspinatus.       Neck Exercises: Stretches   Upper Trapezius Stretch 2 reps;20 seconds   Levator Stretch 2 reps;20 seconds                PT Education - 12/19/15 1743    Education provided Yes   Education Details reviewed manual trigger point release along scalenes, educated husband how to perform at home.    Person(s) Educated Patient   Methods Explanation;Verbal cues   Comprehension Verbalized understanding;Verbal cues required          PT Short Term Goals - 12/12/15 1808      PT SHORT TERM GOAL #1   Title pt will be I with inial HEP (12/16/2015)   Baseline neural floss needs cues   Time 4   Period Weeks   Status On-going     PT SHORT TERM GOAL #2   Title pt will be able to demo proper posture/ lifting and carrying techniques to reduce and prevent R shoulder pain (12/16/2015)   Time 4   Period Weeks   Status On-going     PT SHORT  TERM GOAL #3   Title pt will improve R shoulder flexion/ abduction  internal/ external rotation by >/= 10 degrees with </= 7/10 pain to assist with functional progression (12/16/2015)   Baseline 150 flexion,    Time 4   Period Weeks   Status On-going     PT SHORT TERM GOAL #4   Title pt will improve R shoulder strength to >/= 4-/5 with </= 6/10 pain to promote functional mobility (12/16/2015)   Time 4   Period Weeks   Status Unable to assess           PT Long Term Goals - 11/16/15 1806      PT LONG TERM GOAL #1   Title pt will be I with all HEP issued as of last visit (01/11/2016)   Time 8   Period Weeks   Status New     PT LONG TERM GOAL #2   Title pt will improve R shoulder mobility by >/= 15 degrees in all planes with </= 4/10 pain for functional mobility required for ADLs (01/11/2016)   Time 8   Period Weeks   Status New     PT LONG TERM GOAL #3   Title pt will improve R shoulder strength to >/= 4/5 in all planes for lifting and carrying activities (01/11/2016)    Time 8   Period Weeks   Status New     PT LONG TERM GOAL #4   Title pt will be able to push/pull >/= 10# and  lift/ lower >/= 8# with </= 4/10 pain from an overhead shelf to assist with functional tasks including opening doors reaching into cabinets, and other functional ADLS ([id number])   Period Weeks   Status New     PT LONG TERM GOAL #5   Title pt with increase FOTO score to </= 37% limitation to demonstrate improvement in function at discharge (01/11/2016)   Time [place] - 12/19/15 1746    Clinical Impression Statement Christy Price initally reported pain at 7/10, focused on multiple manual techniques to reduce pain and which she reported some relief with nerve glides but would reported differeing pain in other places with light grade 1cervical distraction. had PTA re-tape her which she reproted provided relief and reported pain dropped to 4/10 post session. plan to assess next visit if pt is progressing with PT or if she would benefit being referred back to her MD for further assessment.    PT Next Visit Plan assess mobiliyt/ goals, attempt 1st rib mobs, nueral flossing with median vs ulnar, scapular mobility, thoracic mobilty, cervical mobs (assess distraction)  Assess Tape for impingementcheck specific goals.   Consulted and Agree with Plan of Care Patient      Patient will benefit from skilled therapeutic intervention in order to improve the following deficits and impairments:  Pain, Decreased endurance, Decreased activity tolerance, Decreased strength, Increased fascial restricitons, Decreased range of motion, Improper body mechanics, Postural dysfunction, Increased edema  Visit Diagnosis: Right shoulder pain, unspecified chronicity  Stiffness of right shoulder, not elsewhere classified  General weakness  Abnormal posture  Chronic right shoulder pain     Problem List Patient Active Problem List   Diagnosis Date Noted   . Nail fungus 03/30/2015  . Toe pain 03/30/2015  . Insomnia 12/19/2014  . Pedal edema 11/06/2014  . Depression 11/06/2014  . Genital herpes  11/06/2014  . Back pain 11/06/2014   Christy Price PT, DPT, LAT, ATC  12/19/15  5:51 PM      [facility] [address] [place], [place], [address] Phone: [phone]   Fax:  [phone]  Name: Christy Price MRN: [id number] Date of Birth: 1970/11/19

## 2015-12-21 ENCOUNTER — Ambulatory Visit: Payer: Commercial Managed Care - HMO | Admitting: Physical Therapy

## 2015-12-21 ENCOUNTER — Telehealth: Payer: Self-pay | Admitting: Physical Therapy

## 2015-12-21 DIAGNOSIS — M25511 Pain in right shoulder: Secondary | ICD-10-CM

## 2015-12-21 DIAGNOSIS — M25611 Stiffness of right shoulder, not elsewhere classified: Secondary | ICD-10-CM | POA: Diagnosis not present

## 2015-12-21 DIAGNOSIS — R531 Weakness: Secondary | ICD-10-CM

## 2015-12-21 DIAGNOSIS — R293 Abnormal posture: Secondary | ICD-10-CM

## 2015-12-21 DIAGNOSIS — G8929 Other chronic pain: Secondary | ICD-10-CM | POA: Diagnosis not present

## 2015-12-21 NOTE — Therapy (Deleted)
[place], [place], [address] Phone: [phone]   Fax:  [phone]  Physical Therapy Treatment / discharge note  Patient Details  Name: Christy Price MRN: [id number] Date of Birth: 31-Jan-1971 Referring Provider: Meridee Score MD  Encounter Date: 12/21/2015      PT End of Session - 12/21/15 1744    Visit Number 7   Number of Visits 17   Date for PT Re-Evaluation 01/11/16   Authorization Type Medicare: Kx mod by 15, Progress note by 10th visit   PT Start Time 1545   PT Stop Time 1634   PT Time Calculation (min) 49 min   Activity Tolerance Patient tolerated treatment well   Behavior During Therapy [facility] for tasks assessed/performed      Past Medical History:  Diagnosis Date  . Cancer (HCC)     hx cervical cx   . Hypertension   . Meningitis     Past Surgical History:  Procedure Laterality Date  . SHUNT REPLACEMENT      There were no vitals filed for this visit.      Subjective Assessment - 12/21/15 1552    Subjective "yesterday I felt like the pain was 6, and today the pain is at about 5/10 in the front of my shoulder"   Currently in Pain? Yes   Pain Score 5    Pain Location Shoulder   Pain Orientation Right;Anterior   Pain Descriptors / Indicators Aching;Sharp   Pain Type Chronic pain   Pain Onset More than a month ago   Pain Frequency Intermittent            [facility] PT Assessment - 12/21/15 0001      Observation/Other Assessments   Focus on Therapeutic Outcomes (FOTO)  62% limited     AROM   Right Shoulder Flexion 100 Degrees  ERP   Right Shoulder ABduction 106 Degrees  ERP   Right Shoulder Internal Rotation 40 Degrees  10/10 pain at End ranges   Right Shoulder External Rotation 65 Degrees     Strength   Right Shoulder Flexion 3/5  10/10 pain    Right Shoulder Extension 4-/5  5/10 pain   Right Shoulder ABduction 3/5  10/10   Right Shoulder Internal Rotation 4-/5   5/10 pain   Right Shoulder External Rotation 4-/5  7                     [facility] Adult PT Treatment/Exercise - 12/21/15 0001      Manual Therapy   Manual therapy comments attempted strain/ counter strain of the pec minor but pt couldn't relax constantly reporting 8-10/10 pain so halted treatment   Joint Mobilization 1st rib mobs, gapping mobilizations to the R grade 2-3, cervical distraction grade 1 for pain   Myofascial Release Scalenes trigger point release 1 X about 60 seconds light pressure   Neural Stretch Flossing VS neural stretch , needed cues Tingling with one motion and not the other.  Unspecified.    [place];Inhibit Muscle     Kinesiotix   Edema upper back   Create Space anterior shoulder   Inhibit Muscle  deltoid, supraspinatus.                  PT Education - 12/21/15 1742    Education provided Yes   Education Details continued with current HEP reviewed  for proper form. findings of pain, and pain and weakness in the  Rotator cuff with soreness located at the insertion and lack of progress with therapy after 7 sessions means something else may be going on with the rorator cuff.   Person(s) Educated Patient   Methods Explanation;Verbal cues;Other (comment)  demonstration using model, extra time needed for translation   Comprehension Verbalized understanding;Verbal cues required          PT Short Term Goals - 12/21/15 1755      PT SHORT TERM GOAL #1   Title pt will be I with inial HEP (12/16/2015)   Time 4   Period Weeks   Status Partially Met     PT SHORT TERM GOAL #2   Title pt will be able to demo proper posture/ lifting and carrying techniques to reduce and prevent R shoulder pain (12/16/2015)   Time 4   Period Weeks   Status Not Met     PT SHORT TERM GOAL #3   Title pt will improve R shoulder flexion/ abduction  internal/ external rotation by >/= 10 degrees with </= 7/10 pain to assist with functional progression  (12/16/2015)   Time 4   Period Weeks   Status Not Met     PT SHORT TERM GOAL #4   Title pt will improve R shoulder strength to >/= 4-/5 with </= 6/10 pain to promote functional mobility (12/16/2015)   Time 4   Period Weeks   Status Not Met           PT Long Term Goals - 12/21/15 1755      PT LONG TERM GOAL #1   Title pt will be I with all HEP issued as of last visit (01/11/2016)   Time 8   Period Weeks   Status Not Met     PT LONG TERM GOAL #2   Title pt will improve R shoulder mobility by >/= 15 degrees in all planes with </= 4/10 pain for functional mobility required for ADLs (01/11/2016)   Time 8   Period Weeks   Status Not Met     PT LONG TERM GOAL #3   Title pt will improve R shoulder strength to >/= 4/5 in all planes for lifting and carrying activities (01/11/2016)   Time 8   Period Weeks   Status Not Met     PT LONG TERM GOAL #4   Title pt will be able to push/pull >/= 10# and  lift/ lower >/= 8# with </= 4/10 pain from an overhead shelf to assist with functional tasks including opening doors reaching into cabinets, and other functional ADLS (82/07/537)   Time 8   Period Weeks   Status Not Met     PT LONG TERM GOAL #5   Title pt with increase FOTO score to </= 37% limitation to demonstrate improvement in function at discharge (01/11/2016)   Time 8   Period Weeks   Status Not Met               Plan - 12/21/15 1747    Clinical Impression Statement Christy Price continues to have 5/10 pain with report that it goes up to 10/10 frequenlty with only relief being KT tape. She has demonstrated regression in ROM since last assessment and pain noted at the lesser tubercle rated at 10/10. weakness and pain with internal / external rotation. discussed with pt at length that after 7 visits she demonstrates lack in functional progress and continues to have siginificant pain that she needs to go back to her referring  MD for further assessment and will be dischared from PT .     PT Next Visit Plan discharged from PT   Consulted and Agree with Plan of Care Patient      Patient will benefit from skilled therapeutic intervention in order to improve the following deficits and impairments:  Pain, Decreased endurance, Decreased activity tolerance, Decreased strength, Increased fascial restricitons, Decreased range of motion, Improper body mechanics, Postural dysfunction, Increased edema  Visit Diagnosis: Right shoulder pain, unspecified chronicity  Stiffness of right shoulder, not elsewhere classified  General weakness  Abnormal posture  Chronic right shoulder pain       G-Codes - 2015-12-28 1756    Functional Assessment Tool Used FOTO/ clinical judgement   Functional Limitation Carrying, moving and handling objects   Carrying, Moving and Handling Objects Goal Status (K8003) At least 20 percent but less than 40 percent impaired, limited or restricted   Carrying, Moving and Handling Objects Discharge Status [phone]) At least 60 percent but less than 80 percent impaired, limited or restricted      Problem List Patient Active Problem List   Diagnosis Date Noted  . Nail fungus 03/30/2015  . Toe pain 03/30/2015  . Insomnia 12/19/2014  . Pedal edema 11/06/2014  . Depression 11/06/2014  . Genital herpes 11/06/2014  . Back pain 11/06/2014   Starr Lake PT, DPT, LAT, ATC  12/28/2015  5:57 PM      [facility] [address] [place], [place], [address] Phone: [phone]   Fax:  [phone]  Name: Christy Price MRN: [id number] Date of Birth: 07-Oct-1970     PHYSICAL THERAPY DISCHARGE SUMMARY  Visits from Start of Care: 7  Current functional level related to goals / functional outcomes: FOTO 62% limited   Remaining deficits: R shoulder pain and muscle tightness in the R upper trap, pec minor/ major. Soreness at the greater/ lesser tubercles with weakness with pain noted in all planes.  Abnormal posture with difficulty maintaining requiring multiple verbal cues. Limited AROM secondary to weakness and pain in the R shoulder. Pt reports N/T intermittent but also pain and can be unclear of specific symptomology.   Education / Equipment: HEP, posture, theraband, shoulder anatomy Plan: Patient agrees to discharge.  Patient goals were not met. Patient is being discharged due to lack of progress.  ?????

## 2015-12-21 NOTE — Therapy (Signed)
[place], [place], [address] Phone: [phone]   Fax:  [phone]  Physical Therapy Treatment / Discharge Note  Patient Details  Name: Christy Price MRN: [id number] Date of Birth: 1971/02/01 Referring Provider: Meridee Score MD  Encounter Date: 12/21/2015      PT End of Session - 12/21/15 1744    Visit Number 7   Number of Visits 17   Date for PT Re-Evaluation 01/11/16   Authorization Type Medicare: Kx mod by 15, Progress note by 10th visit   PT Start Time 1545   PT Stop Time 1634   PT Time Calculation (min) 49 min   Activity Tolerance Patient tolerated treatment well   Behavior During Therapy [facility] for tasks assessed/performed      Past Medical History:  Diagnosis Date  . Cancer (HCC)     hx cervical cx   . Hypertension   . Meningitis     Past Surgical History:  Procedure Laterality Date  . SHUNT REPLACEMENT      There were no vitals filed for this visit.      Subjective Assessment - 12/21/15 1552    Subjective "yesterday I felt like the pain was 6, and today the pain is at about 5/10 in the front of my shoulder"   Currently in Pain? Yes   Pain Score 5    Pain Location Shoulder   Pain Orientation Right;Anterior   Pain Descriptors / Indicators Aching;Sharp   Pain Type Chronic pain   Pain Onset More than a month ago   Pain Frequency Intermittent            [facility] PT Assessment - 12/21/15 0001      Observation/Other Assessments   Focus on Therapeutic Outcomes (FOTO)  62% limited     AROM   Right Shoulder Flexion 100 Degrees  ERP   Right Shoulder ABduction 106 Degrees  ERP   Right Shoulder Internal Rotation 40 Degrees  10/10 pain at End ranges   Right Shoulder External Rotation 65 Degrees     Strength   Right Shoulder Flexion 3/5  10/10 pain    Right Shoulder Extension 4-/5  5/10 pain   Right Shoulder ABduction 3/5  10/10   Right Shoulder Internal Rotation 4-/5   5/10 pain   Right Shoulder External Rotation 4-/5  7                     [facility] Adult PT Treatment/Exercise - 12/21/15 0001      Manual Therapy   Manual therapy comments attempted strain/ counter strain of the pec minor but pt couldn't relax constantly reporting 8-10/10 pain so halted treatment   Joint Mobilization 1st rib mobs, gapping mobilizations to the R grade 2-3, cervical distraction grade 1 for pain   Myofascial Release Scalenes trigger point release 1 X about 60 seconds light pressure   Neural Stretch Flossing VS neural stretch , needed cues Tingling with one motion and not the other.  Unspecified.    [place];Inhibit Muscle     Kinesiotix   Edema upper back   Create Space anterior shoulder   Inhibit Muscle  deltoid, supraspinatus.                  PT Education - 12/21/15 1742    Education provided Yes   Education Details continued with current HEP reviewed  for proper form. findings of pain, and pain and weakness in the  Rotator cuff with soreness located at the insertion and lack of progress with therapy after 7 sessions means something else may be going on with the rorator cuff.   Person(s) Educated Patient   Methods Explanation;Verbal cues;Other (comment)  demonstration using model, extra time needed for translation   Comprehension Verbalized understanding;Verbal cues required          PT Short Term Goals - 12/21/15 1755      PT SHORT TERM GOAL #1   Title pt will be I with inial HEP (12/16/2015)   Time 4   Period Weeks   Status Partially Met     PT SHORT TERM GOAL #2   Title pt will be able to demo proper posture/ lifting and carrying techniques to reduce and prevent R shoulder pain (12/16/2015)   Time 4   Period Weeks   Status Not Met     PT SHORT TERM GOAL #3   Title pt will improve R shoulder flexion/ abduction  internal/ external rotation by >/= 10 degrees with </= 7/10 pain to assist with functional progression  (12/16/2015)   Time 4   Period Weeks   Status Not Met     PT SHORT TERM GOAL #4   Title pt will improve R shoulder strength to >/= 4-/5 with </= 6/10 pain to promote functional mobility (12/16/2015)   Time 4   Period Weeks   Status Not Met           PT Long Term Goals - 12/21/15 1755      PT LONG TERM GOAL #1   Title pt will be I with all HEP issued as of last visit (01/11/2016)   Time 8   Period Weeks   Status Not Met     PT LONG TERM GOAL #2   Title pt will improve R shoulder mobility by >/= 15 degrees in all planes with </= 4/10 pain for functional mobility required for ADLs (01/11/2016)   Time 8   Period Weeks   Status Not Met     PT LONG TERM GOAL #3   Title pt will improve R shoulder strength to >/= 4/5 in all planes for lifting and carrying activities (01/11/2016)   Time 8   Period Weeks   Status Not Met     PT LONG TERM GOAL #4   Title pt will be able to push/pull >/= 10# and  lift/ lower >/= 8# with </= 4/10 pain from an overhead shelf to assist with functional tasks including opening doors reaching into cabinets, and other functional ADLS (82/07/537)   Time 8   Period Weeks   Status Not Met     PT LONG TERM GOAL #5   Title pt with increase FOTO score to </= 37% limitation to demonstrate improvement in function at discharge (01/11/2016)   Time 8   Period Weeks   Status Not Met               Plan - 12/21/15 1747    Clinical Impression Statement Mrs. Magalie continues to have 5/10 pain with report that it goes up to 10/10 frequenlty with only relief being KT tape. She has demonstrated regression in ROM since last assessment and pain noted at the lesser tubercle rated at 10/10. weakness and pain with internal / external rotation. discussed with pt at length that after 7 visits she demonstrates lack in functional progress and continues to have siginificant pain that she needs to go back to her referring  MD for further assessment. plan to hold off on PT until  pt is seen by her MD for further assessment.     PT Next Visit Plan see what MD says and determine if pt is right for PT   Consulted and Agree with Plan of Care Patient      Patient will benefit from skilled therapeutic intervention in order to improve the following deficits and impairments:  Pain, Decreased endurance, Decreased activity tolerance, Decreased strength, Increased fascial restricitons, Decreased range of motion, Improper body mechanics, Postural dysfunction, Increased edema  Visit Diagnosis: Right shoulder pain, unspecified chronicity  Stiffness of right shoulder, not elsewhere classified  General weakness  Abnormal posture  Chronic right shoulder pain       G-Codes - 09-Jan-2016 1756    Functional Assessment Tool Used --   Functional Limitation --   Carrying, Moving and Handling Objects Goal Status (G8403) --   Carrying, Moving and Handling Objects Discharge Status (V5331) --      Problem List Patient Active Problem List   Diagnosis Date Noted  . Nail fungus 03/30/2015  . Toe pain 03/30/2015  . Insomnia 12/19/2014  . Pedal edema 11/06/2014  . Depression 11/06/2014  . Genital herpes 11/06/2014  . Back pain 11/06/2014   Starr Lake PT, DPT, LAT, ATC  01-09-2016  6:12 PM      [facility] [address] [place], [place], [address] Phone: [phone]   Fax:  [phone]  Name: Christy Price MRN: [id number] Date of Birth: [birth year]  PHYSICAL THERAPY DISCHARGE SUMMARY  Visits from Start of Care: 7  Current functional level related to goals / functional outcomes: FOTO 68% limited   Remaining deficits: R shoulder pain and muscle tightness in the R upper trap, pec minor/ major. Soreness at the greater/ lesser tubercles with weakness with pain noted in all planes. Abnormal posture with difficulty maintaining requiring multiple verbal cues. Limited AROM secondary to weakness and pain in the R  shoulder. Pt reports N/T intermittent but also pain and can be unclear of specific symptomology.    Education / Equipment: HEP, posture, theraband,   Plan: Patient agrees to discharge.  Patient goals were not met. Patient is being discharged due to lack of progress.  ?????

## 2015-12-21 NOTE — Telephone Encounter (Signed)
Called to let pt know that based on lack of progress that she is being discharged from physical therapy and that we are going to cancel the visits she had made today. That the note from today's session was sent to her MD and that she needs to follow up with Dr Sharol Given.

## 2015-12-28 ENCOUNTER — Ambulatory Visit (INDEPENDENT_AMBULATORY_CARE_PROVIDER_SITE_OTHER): Payer: Commercial Managed Care - HMO | Admitting: Orthopedic Surgery

## 2015-12-28 DIAGNOSIS — M7662 Achilles tendinitis, left leg: Secondary | ICD-10-CM

## 2015-12-28 DIAGNOSIS — M7541 Impingement syndrome of right shoulder: Secondary | ICD-10-CM | POA: Diagnosis not present

## 2015-12-29 ENCOUNTER — Other Ambulatory Visit (INDEPENDENT_AMBULATORY_CARE_PROVIDER_SITE_OTHER): Payer: Self-pay | Admitting: Orthopedic Surgery

## 2015-12-29 DIAGNOSIS — M25511 Pain in right shoulder: Secondary | ICD-10-CM

## 2015-12-29 DIAGNOSIS — M25869 Other specified joint disorders, unspecified knee: Secondary | ICD-10-CM

## 2016-01-01 ENCOUNTER — Encounter: Payer: Commercial Managed Care - HMO | Admitting: Physical Therapy

## 2016-01-03 ENCOUNTER — Encounter: Payer: Commercial Managed Care - HMO | Admitting: Physical Therapy

## 2016-01-08 ENCOUNTER — Ambulatory Visit (HOSPITAL_COMMUNITY): Payer: Commercial Managed Care - HMO

## 2016-01-09 ENCOUNTER — Encounter: Payer: Commercial Managed Care - HMO | Admitting: Physical Therapy

## 2016-01-11 ENCOUNTER — Encounter: Payer: Commercial Managed Care - HMO | Admitting: Physical Therapy

## 2016-01-17 ENCOUNTER — Telehealth (INDEPENDENT_AMBULATORY_CARE_PROVIDER_SITE_OTHER): Payer: Self-pay | Admitting: *Deleted

## 2016-01-17 NOTE — Telephone Encounter (Signed)
ERROR

## 2016-01-18 ENCOUNTER — Telehealth (INDEPENDENT_AMBULATORY_CARE_PROVIDER_SITE_OTHER): Payer: Self-pay | Admitting: *Deleted

## 2016-01-18 NOTE — Telephone Encounter (Signed)
Pt called left vm stating needs MRI of shoulder and to please call her back, I called back and left message for her to return my call

## 2016-01-19 NOTE — Telephone Encounter (Signed)
Called pt back and she informed me that she is still in pain in her shoulder and would like to proceed with MRI, I advised pt that we were waiting on approval from her insurance co and once I get authorization I will get her scheduled

## 2016-03-15 ENCOUNTER — Other Ambulatory Visit: Payer: Self-pay | Admitting: Family Medicine

## 2016-03-22 ENCOUNTER — Ambulatory Visit (HOSPITAL_COMMUNITY)
Admission: RE | Admit: 2016-03-22 | Discharge: 2016-03-22 | Disposition: A | Discharge status: Home / Self Care | Payer: Medicare HMO | Source: Ambulatory Visit | Attending: Orthopedic Surgery | Admitting: Orthopedic Surgery

## 2016-03-22 DIAGNOSIS — M75101 Unspecified rotator cuff tear or rupture of right shoulder, not specified as traumatic: Secondary | ICD-10-CM | POA: Diagnosis not present

## 2016-03-22 DIAGNOSIS — M25869 Other specified joint disorders, unspecified knee: Secondary | ICD-10-CM

## 2016-03-22 DIAGNOSIS — M25511 Pain in right shoulder: Secondary | ICD-10-CM

## 2016-03-22 DIAGNOSIS — S46011A Strain of muscle(s) and tendon(s) of the rotator cuff of right shoulder, initial encounter: Secondary | ICD-10-CM | POA: Insufficient documentation

## 2016-03-22 DIAGNOSIS — X58XXXA Exposure to other specified factors, initial encounter: Secondary | ICD-10-CM | POA: Insufficient documentation

## 2016-04-03 ENCOUNTER — Ambulatory Visit (INDEPENDENT_AMBULATORY_CARE_PROVIDER_SITE_OTHER): Payer: Medicare HMO | Admitting: Orthopedic Surgery

## 2016-04-03 DIAGNOSIS — M75111 Incomplete rotator cuff tear or rupture of right shoulder, not specified as traumatic: Secondary | ICD-10-CM

## 2016-04-03 NOTE — Progress Notes (Signed)
   Office Visit Note   Patient: Christy Price           Date of Birth: May 29, 1970           MRN: [id number] Visit Date: 04/03/2016              Requested by: Micheline Chapman, NP [place], [place] [address] PCP: Sharon Seller, NP  Chief Complaint  Patient presents with  . Right Shoulder - Follow-up    MRI review    HPI: Review MRI of right shoulder. Autumn L Forrest, RMA  Patient complains of pain with sleeping pain with activities of daily living she states she is failed several months of conservative therapy.  Assessment & Plan: Visit Diagnoses:  1. 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 Discussed with the patient through her interpreter that we could proceed with arthroscopic intervention risks and benefits of surgery were discussed including infection neurovascular injury persistent pain and need for additional surgery. Discussed that in general her symptoms could be 75% better. Patient states she understands wishes to proceed with surgery at this time we will call her to set up outpatient surgery at the orthopedic surgical center and follow-up in the office 2 weeks postoperatively.  Follow-Up Instructions: Return in about 2 weeks (around 04/17/2016).   Ortho Exam Examination patient is alert oriented no adenopathy well-dressed will affect normal respiratory effort she has normal gait. Examination she has abduction and flexion of the right shoulder to 70 she has pain with Neer and Hawkins impingement test pain with drop arm test. Review of the MRI scan shows partial tearing of the rotator cuff.  Imaging: No results found.  Orders:  No orders of the defined types were placed in this encounter.  No orders of the defined types were placed in this encounter.    Procedures: No procedures performed  Clinical Data: No additional findings.  Subjective: Review of  Systems  Objective: Vital Signs: There were no vitals taken for this visit.  Specialty Comments:  No specialty comments available.  PMFS History: Patient Active Problem List   Diagnosis Date Noted  . 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 Nail fungus 03/30/2015  . Toe pain 03/30/2015  . Insomnia 12/19/2014  . Pedal edema 11/06/2014  . Depression 11/06/2014  . Genital herpes 11/06/2014  . Back pain 11/06/2014   Past Medical History:  Diagnosis Date  . Cancer (HCC)     hx cervical cx   . Hypertension   . Meningitis     No family history on file.  Past Surgical History:  Procedure Laterality Date  . SHUNT REPLACEMENT     Social History   Occupational History  . Not on file.   Social History Main Topics  . Smoking status: Current Every Day Smoker    Packs/day: 1.00    Types: Cigarettes  . Smokeless tobacco: Not on file  . Alcohol use No  . Drug use: No  . Sexual activity: Not on file

## 2016-04-15 ENCOUNTER — Telehealth (INDEPENDENT_AMBULATORY_CARE_PROVIDER_SITE_OTHER): Payer: Self-pay | Admitting: Orthopedic Surgery

## 2016-04-15 NOTE — Telephone Encounter (Signed)
Pt wanting to sch surgery

## 2016-04-15 NOTE — Telephone Encounter (Signed)
Pt asking what she needs to do next as far as surgery goes, she hasn't heard back  [phone]

## 2016-04-15 NOTE — Telephone Encounter (Signed)
Taren to call patient and schedule surgery.

## 2016-04-20 ENCOUNTER — Other Ambulatory Visit: Payer: Self-pay | Admitting: Family Medicine

## 2016-04-30 DIAGNOSIS — G8918 Other acute postprocedural pain: Secondary | ICD-10-CM | POA: Diagnosis not present

## 2016-04-30 DIAGNOSIS — M7541 Impingement syndrome of right shoulder: Secondary | ICD-10-CM | POA: Diagnosis not present

## 2016-04-30 DIAGNOSIS — M7551 Bursitis of right shoulder: Secondary | ICD-10-CM | POA: Diagnosis not present

## 2016-04-30 DIAGNOSIS — M75121 Complete rotator cuff tear or rupture of right shoulder, not specified as traumatic: Secondary | ICD-10-CM | POA: Diagnosis not present

## 2016-04-30 DIAGNOSIS — M24011 Loose body in right shoulder: Secondary | ICD-10-CM | POA: Diagnosis not present

## 2016-05-07 ENCOUNTER — Inpatient Hospital Stay (INDEPENDENT_AMBULATORY_CARE_PROVIDER_SITE_OTHER): Payer: Commercial Managed Care - HMO | Admitting: Orthopedic Surgery

## 2016-05-09 ENCOUNTER — Ambulatory Visit (INDEPENDENT_AMBULATORY_CARE_PROVIDER_SITE_OTHER): Payer: Medicare HMO | Admitting: Orthopedic Surgery

## 2016-05-09 VITALS — Ht 59.0 in | Wt 161.0 lb

## 2016-05-09 DIAGNOSIS — M75111 Incomplete rotator cuff tear or rupture of right shoulder, not specified as traumatic: Secondary | ICD-10-CM

## 2016-05-09 MED ORDER — HYDROCODONE-ACETAMINOPHEN 5-325 MG PO TABS: 1.0000 | ORAL_TABLET | Freq: Four times a day (QID) | ORAL | 0 refills | Status: DC | PRN

## 2016-05-09 NOTE — Progress Notes (Signed)
Office Visit Note   Patient: Christy Price           Date of Birth: 1970-07-28           MRN: [id number] Visit Date: 05/09/2016              Requested by: Micheline Chapman, NP [place], [place] [address] PCP: Sharon Seller, NP  Chief Complaint  Patient presents with  . Right Shoulder - Routine Post Op    04/30/16 right shoulder scope and SAD    HPI: Pt is 1 1/2 week s/p a right shoulder debridement and SAD. Stitches were removed today. She does have a significant amount of bruising of the shoulder and bicep. She is working on [employer] and ROM and feels like she is making good progress. Pamella Pert, RMA    Assessment & Plan: Visit Diagnoses:  1. 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  Follow-Up Instructions: Return in about 4 weeks (around 06/06/2016).   Ortho Exam Examination the portals are clean and dry she does have some bruising into her biceps region. There is no cellulitis no signs of infection.  Imaging: No results found.  Labs: Lab Results  Component Value Date   ESRSEDRATE 3 07/24/2007   REPTSTATUS 09/19/2007 FINAL 09/18/2007   REPTSTATUS 09/22/2007 FINAL 09/18/2007   GRAMSTAIN  08/11/2007    WBC PRESENT, PREDOMINANTLY PMN NO ORGANISMS SEEN CYTOSPIN SLIDE Gram Stain Report Called to,Read Back By and Verified With: BROUSSARD E.,RN 08/11/07 2055 BY DUNCANJ   GRAMSTAIN  08/11/2007    CYTOSPIN WBC PRESENT,BOTH PMN AND MONONUCLEAR NO ORGANISMS SEEN   GRAMSTAIN  08/11/2007    WBC PRESENT, PREDOMINANTLY PMN NO ORGANISMS SEEN CYTOSPIN SLIDE Gram Stain Report Called to,Read Back By and Verified With: BROUSSARD E.,RN 08/11/07 2055 BY DUNCANJ   CULT  09/18/2007    NO SALMONELLA, SHIGELLA, CAMPYLOBACTER, OR YERSINIA  ISOLATED Note: REDUCED NORMAL FLORA PRESENT    Orders:  No orders of the defined types were placed in this encounter.  Meds ordered this encounter  Medications  . HYDROcodone-acetaminophen (NORCO/VICODIN) 5-325 MG tablet    Sig: Take 1 tablet by mouth every 6 (six) hours as needed for moderate pain.    Dispense:  30 tablet    Refill:  0     Procedures: No procedures performed  Clinical Data: No additional findings.  Subjective: Review of Systems  Objective: Vital Signs: Ht 4\' 11"  (1.499 m)   Wt 161 lb (73 kg)   BMI 32.52 kg/m   Specialty Comments:  No specialty comments available.  PMFS History: Patient Active Problem List   Diagnosis Date Noted  . 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 Nail fungus 03/30/2015  . Toe pain 03/30/2015  . Insomnia 12/19/2014  . Pedal edema 11/06/2014  . Depression 11/06/2014  . Genital herpes 11/06/2014  . Back pain 11/06/2014   Past Medical History:  Diagnosis Date  . Cancer (HCC)     hx cervical cx   . Hypertension   . Meningitis     No family history on file.  Past Surgical History:  Procedure Laterality Date  . SHUNT REPLACEMENT     Social History   Occupational History  . Not on file.   Social History Main  Topics  . Smoking status: Current Every Day Smoker    Packs/day: 1.00    Types: Cigarettes  . Smokeless tobacco: Not on file  . Alcohol use No  . Drug use: No  . Sexual activity: Not on file

## 2016-06-06 ENCOUNTER — Ambulatory Visit (INDEPENDENT_AMBULATORY_CARE_PROVIDER_SITE_OTHER): Payer: Medicare HMO | Admitting: Orthopedic Surgery

## 2016-06-06 DIAGNOSIS — M75111 Incomplete rotator cuff tear or rupture of right shoulder, not specified as traumatic: Secondary | ICD-10-CM

## 2016-06-06 NOTE — Progress Notes (Signed)
Office Visit Note   Patient: Christy Price           Date of Birth: [birth year]           MRN: [id number] Visit Date: 06/06/2016              Requested by: Micheline Chapman, NP [place], [place] [address] PCP: Sharon Seller, NP  No chief complaint on file.     HPI: Is a 46 year old woman who presents 5 weeks status post right shoulder scope. Home exercises and wall walking still feel uncomfortable raising her arm above 90. This is painful. Pain with reaching behind her back. It has yet to begin physical therapy. States has never been referred.  Assessment & Plan: Visit Diagnoses:  1. 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 She'll follow-up in office in 4 weeks.  Follow-Up Instructions: Return in about 4 weeks (around 07/04/2016).   Right Shoulder Exam   Tenderness  The patient is experiencing tenderness in the biceps tendon.  Range of Motion  Active Abduction: 90  Passive Abduction: normal   Tests  Impingement: positive  Other  Scars: present Sensation: normal Pulse: present      Patient is alert, oriented, no adenopathy, well-dressed, normal affect, normal respiratory effort.   Imaging: No results found.  Labs: Lab Results  Component Value Date   ESRSEDRATE 3 07/24/2007   REPTSTATUS 09/19/2007 FINAL 09/18/2007   REPTSTATUS 09/22/2007 FINAL 09/18/2007   GRAMSTAIN  08/11/2007    WBC PRESENT, PREDOMINANTLY PMN NO ORGANISMS SEEN CYTOSPIN SLIDE Gram Stain Report Called to,Read Back By and Verified With: BROUSSARD E.,RN 08/11/07 2055 BY DUNCANJ   GRAMSTAIN  08/11/2007    CYTOSPIN WBC PRESENT,BOTH PMN AND MONONUCLEAR NO ORGANISMS SEEN   GRAMSTAIN  08/11/2007    WBC PRESENT, PREDOMINANTLY PMN NO ORGANISMS SEEN CYTOSPIN SLIDE Gram Stain Report Called to,Read Back By and Verified With: BROUSSARD E.,RN 08/11/07 2055 BY DUNCANJ   CULT  09/18/2007    NO SALMONELLA, SHIGELLA,  CAMPYLOBACTER, OR YERSINIA ISOLATED Note: REDUCED NORMAL FLORA PRESENT    Orders:  Orders Placed This Encounter  Procedures  . Ambulatory referral to Physical Therapy   No orders of the defined types were placed in this encounter.    Procedures: No procedures performed  Clinical Data: No additional findings.  ROS: . Review of Systems  Constitutional: Negative for chills and fever.  Musculoskeletal: Positive for arthralgias and myalgias. Negative for joint swelling.  Neurological: Positive for numbness. Negative for weakness.    Objective: Vital Signs: There were no vitals taken for this visit.  Specialty Comments:  No specialty comments available.  PMFS History: Patient Active Problem List   Diagnosis Date Noted  . 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 Nail fungus 03/30/2015  . Toe pain 03/30/2015  . Insomnia 12/19/2014  . Pedal edema 11/06/2014  . Depression 11/06/2014  . Genital herpes 11/06/2014  . Back pain 11/06/2014   Past Medical History:  Diagnosis Date  . Cancer (HCC)     hx cervical cx   . Hypertension   . Meningitis     No family history on file.  Past Surgical History:  Procedure Laterality Date  . SHUNT REPLACEMENT     Social History   Occupational History  . Not on file.   Social History Main Topics  . Smoking status: Current Every Day Smoker  Packs/day: 1.00    Types: Cigarettes  . Smokeless tobacco: Not on file  . Alcohol use No  . Drug use: No  . Sexual activity: Not on file

## 2016-06-11 ENCOUNTER — Other Ambulatory Visit (INDEPENDENT_AMBULATORY_CARE_PROVIDER_SITE_OTHER): Payer: Self-pay | Admitting: Orthopedic Surgery

## 2016-06-11 ENCOUNTER — Telehealth (INDEPENDENT_AMBULATORY_CARE_PROVIDER_SITE_OTHER): Payer: Self-pay | Admitting: Orthopedic Surgery

## 2016-06-11 MED ORDER — HYDROCODONE-ACETAMINOPHEN 5-325 MG PO TABS: 1.0000 | ORAL_TABLET | Freq: Four times a day (QID) | ORAL | 0 refills | Status: DC | PRN

## 2016-06-11 NOTE — Telephone Encounter (Signed)
Patient is requesting Rx for Hydrocodone (for pain in L shoulder).  Please call when ready for pick up.

## 2016-06-11 NOTE — Telephone Encounter (Signed)
Prescription written

## 2016-06-11 NOTE — Telephone Encounter (Signed)
I called and advised that rx is at the desk for pick up.

## 2016-07-02 ENCOUNTER — Ambulatory Visit (INDEPENDENT_AMBULATORY_CARE_PROVIDER_SITE_OTHER): Payer: Medicare HMO | Admitting: Orthopedic Surgery

## 2016-07-15 ENCOUNTER — Ambulatory Visit (INDEPENDENT_AMBULATORY_CARE_PROVIDER_SITE_OTHER): Payer: Medicare HMO | Admitting: Orthopedic Surgery

## 2016-07-15 ENCOUNTER — Ambulatory Visit (INDEPENDENT_AMBULATORY_CARE_PROVIDER_SITE_OTHER): Payer: Medicare HMO | Admitting: Family

## 2016-07-17 ENCOUNTER — Encounter (HOSPITAL_COMMUNITY): Payer: Self-pay

## 2016-07-17 ENCOUNTER — Other Ambulatory Visit (INDEPENDENT_AMBULATORY_CARE_PROVIDER_SITE_OTHER): Payer: Self-pay | Admitting: Family

## 2016-07-17 ENCOUNTER — Encounter (INDEPENDENT_AMBULATORY_CARE_PROVIDER_SITE_OTHER): Payer: Self-pay | Admitting: Family

## 2016-07-17 ENCOUNTER — Inpatient Hospital Stay (INDEPENDENT_AMBULATORY_CARE_PROVIDER_SITE_OTHER): Payer: Self-pay

## 2016-07-17 ENCOUNTER — Ambulatory Visit (INDEPENDENT_AMBULATORY_CARE_PROVIDER_SITE_OTHER): Payer: Medicare HMO | Admitting: Family

## 2016-07-17 ENCOUNTER — Ambulatory Visit (HOSPITAL_COMMUNITY)
Admission: RE | Admit: 2016-07-17 | Discharge: 2016-07-17 | Disposition: A | Discharge status: Home / Self Care | Payer: Medicare HMO | Source: Ambulatory Visit | Attending: Cardiology | Admitting: Cardiology

## 2016-07-17 VITALS — Ht 59.0 in | Wt 161.0 lb

## 2016-07-17 DIAGNOSIS — M79605 Pain in left leg: Secondary | ICD-10-CM | POA: Diagnosis not present

## 2016-07-17 DIAGNOSIS — E669 Obesity, unspecified: Secondary | ICD-10-CM | POA: Insufficient documentation

## 2016-07-17 DIAGNOSIS — Z87898 Personal history of other specified conditions: Secondary | ICD-10-CM | POA: Diagnosis not present

## 2016-07-17 DIAGNOSIS — R609 Edema, unspecified: Secondary | ICD-10-CM | POA: Diagnosis not present

## 2016-07-17 DIAGNOSIS — Z6832 Body mass index (BMI) 32.0-32.9, adult: Secondary | ICD-10-CM | POA: Insufficient documentation

## 2016-07-17 DIAGNOSIS — M75111 Incomplete rotator cuff tear or rupture of right shoulder, not specified as traumatic: Secondary | ICD-10-CM

## 2016-07-17 DIAGNOSIS — M79662 Pain in left lower leg: Secondary | ICD-10-CM

## 2016-07-17 NOTE — Progress Notes (Signed)
Today's bilateral lower extremity venous duplex is negative for DVT. Preliminary results given to Autumn. Carlisle Cater was the interpreter.

## 2016-07-19 NOTE — Progress Notes (Signed)
Office Visit Note   Patient: Christy Price           Date of Birth: 1970-07-24           MRN: [id number] Visit Date: 07/17/2016              Requested by: Micheline Chapman, NP No address on file PCP: Micheline Chapman, NP  Chief Complaint  Patient presents with  . Right Shoulder - Pain    s/p right shoulder scope about 10 weeks out  . Left Ankle - Pain      HPI: The patient is a 45 year old woman who presents today in follow-up for right shoulder pain. She's got a history of rotator cuff tear on the right. Did have a arthroscopy about 10 weeks ago. Has had persistent pain post surgery. 4 weeks ago was provided with an order for physical therapy. Has not been unable to set up any appointments. Complains of persistent pain with loss of range of motion. Is able to get her arm up just above 90.   She also is complaining of left calf pain. She is just returned from a beach trip. Did ride in the car all day on Sunday. States she noticed her calf began hurting on Monday. Him reports pain is made worse by ambulation. No known injury.  Assessment & Plan: Visit Diagnoses:  1. 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 Pain of left calf     Plan: Have provided an order for a ultrasound to rule out DVT. This will be done later today. She'll follow-up in office for the right shoulder in 2 months. Him strongly recommended she get in with physical therapy and work hard on range of motion arm circles and walking her arm up the wall.  Follow-Up Instructions: No Follow-up on file.   Right Shoulder Exam   Tenderness  The patient is experiencing tenderness in the biceps tendon.  Range of Motion  Passive Abduction: 100  Forward Flexion: 90   Tests  Drop Arm: negative Impingement: positive  Other  Erythema: absent Pulse: present      Patient is alert, oriented, no adenopathy, well-dressed, normal affect, normal respiratory effort. Left calf tender to palpation  there are no palpable cords. Does have 1+ pitting edema to the left lower extremity. No warmth no erythema. pain with dorsiflexion.  Imaging: No results found.  Labs: Lab Results  Component Value Date   ESRSEDRATE 3 07/24/2007   REPTSTATUS 09/19/2007 FINAL 09/18/2007   REPTSTATUS 09/22/2007 FINAL 09/18/2007   GRAMSTAIN  08/11/2007    WBC PRESENT, PREDOMINANTLY PMN NO ORGANISMS SEEN CYTOSPIN SLIDE Gram Stain Report Called to,Read Back By and Verified With: BROUSSARD E.,RN 08/11/07 2055 BY DUNCANJ   GRAMSTAIN  08/11/2007    CYTOSPIN WBC PRESENT,BOTH PMN AND MONONUCLEAR NO ORGANISMS SEEN   GRAMSTAIN  08/11/2007    WBC PRESENT, PREDOMINANTLY PMN NO ORGANISMS SEEN CYTOSPIN SLIDE Gram Stain Report Called to,Read Back By and Verified With: BROUSSARD E.,RN 08/11/07 2055 BY DUNCANJ   CULT  09/18/2007    NO SALMONELLA, SHIGELLA, CAMPYLOBACTER, OR YERSINIA ISOLATED Note: REDUCED NORMAL FLORA PRESENT    Orders:  No orders of the defined types were placed in this encounter.  No orders of the defined types were placed in this encounter.    Procedures: No procedures performed  Clinical Data: No additional findings.  ROS:  All other systems negative, except as noted in the HPI. Review of Systems  Constitutional:  Negative for chills and fever.  Cardiovascular: Positive for leg swelling.  Musculoskeletal: Positive for arthralgias and myalgias.  Skin: Negative for color change.  Neurological: Negative for weakness and numbness.    Objective: Vital Signs: Ht 4\' 11"  (1.499 m)   Wt 161 lb (73 kg)   BMI 32.52 kg/m   Specialty Comments:  No specialty comments available.  PMFS History: Patient Active Problem List   Diagnosis Date Noted  . 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 Nail fungus 03/30/2015  . Toe pain 03/30/2015  . Insomnia 12/19/2014  . Pedal edema 11/06/2014  . Depression 11/06/2014  . Genital herpes 11/06/2014  . Back pain 11/06/2014     Past Medical History:  Diagnosis Date  . Cancer (HCC)     hx cervical cx   . Hypertension   . Meningitis     No family history on file.  Past Surgical History:  Procedure Laterality Date  . SHUNT REPLACEMENT     Social History   Occupational History  . Not on file.   Social History Main Topics  . Smoking status: Current Every Day Smoker    Packs/day: 1.00    Types: Cigarettes  . Smokeless tobacco: Never Used  . Alcohol use No  . Drug use: No  . Sexual activity: Not on file

## 2016-08-22 ENCOUNTER — Encounter: Payer: Self-pay | Admitting: Family Medicine

## 2016-08-22 ENCOUNTER — Ambulatory Visit (INDEPENDENT_AMBULATORY_CARE_PROVIDER_SITE_OTHER): Payer: Medicare HMO | Admitting: Family Medicine

## 2016-08-22 VITALS — BP 146/80 | HR 79 | Temp 97.9°F | Resp 16 | Ht 59.0 in | Wt 182.0 lb

## 2016-08-22 DIAGNOSIS — Z131 Encounter for screening for diabetes mellitus: Secondary | ICD-10-CM | POA: Diagnosis not present

## 2016-08-22 DIAGNOSIS — Z1329 Encounter for screening for other suspected endocrine disorder: Secondary | ICD-10-CM

## 2016-08-22 DIAGNOSIS — R600 Localized edema: Secondary | ICD-10-CM

## 2016-08-22 DIAGNOSIS — B351 Tinea unguium: Secondary | ICD-10-CM

## 2016-08-22 DIAGNOSIS — F32A Depression, unspecified: Secondary | ICD-10-CM

## 2016-08-22 DIAGNOSIS — F329 Major depressive disorder, single episode, unspecified: Secondary | ICD-10-CM

## 2016-08-22 DIAGNOSIS — I809 Phlebitis and thrombophlebitis of unspecified site: Secondary | ICD-10-CM | POA: Diagnosis not present

## 2016-08-22 DIAGNOSIS — G4700 Insomnia, unspecified: Secondary | ICD-10-CM

## 2016-08-22 DIAGNOSIS — I10 Essential (primary) hypertension: Secondary | ICD-10-CM

## 2016-08-22 LAB — COMPLETE METABOLIC PANEL WITH GFR
ALT: 10 U/L (ref 6–29)
AST: 15 U/L (ref 10–35)
Albumin: 3.9 g/dL (ref 3.6–5.1)
Alkaline Phosphatase: 72 U/L (ref 33–115)
BUN: 16 mg/dL (ref 7–25)
CALCIUM: 8.7 mg/dL (ref 8.6–10.2)
CHLORIDE: 104 mmol/L (ref 98–110)
CO2: 19 mmol/L — AB (ref 20–31)
Creat: 0.71 mg/dL (ref 0.50–1.10)
Glucose, Bld: 89 mg/dL (ref 65–99)
POTASSIUM: 3.6 mmol/L (ref 3.5–5.3)
Sodium: 136 mmol/L (ref 135–146)
Total Bilirubin: 0.2 mg/dL (ref 0.2–1.2)
Total Protein: 6.6 g/dL (ref 6.1–8.1)

## 2016-08-22 LAB — CBC WITH DIFFERENTIAL/PLATELET
BASOS PCT: 1 %
Basophils Absolute: 99 cells/uL (ref 0–200)
EOS ABS: 297 {cells}/uL (ref 15–500)
Eosinophils Relative: 3 %
HCT: 41.7 % (ref 35.0–45.0)
Hemoglobin: 14.2 g/dL (ref 11.7–15.5)
LYMPHS PCT: 27 %
Lymphs Abs: 2673 cells/uL (ref 850–3900)
MCH: 31.3 pg (ref 27.0–33.0)
MCHC: 34.1 g/dL (ref 32.0–36.0)
MCV: 92.1 fL (ref 80.0–100.0)
MONOS PCT: 8 %
MPV: 11.3 fL (ref 7.5–12.5)
Monocytes Absolute: 792 cells/uL (ref 200–950)
NEUTROS PCT: 61 %
Neutro Abs: 6039 cells/uL (ref 1500–7800)
PLATELETS: 305 10*3/uL (ref 140–400)
RBC: 4.53 MIL/uL (ref 3.80–5.10)
RDW: 13.9 % (ref 11.0–15.0)
WBC: 9.9 10*3/uL (ref 3.8–10.8)

## 2016-08-22 MED ORDER — POTASSIUM CHLORIDE CRYS ER 10 MEQ PO TBCR: 10.0000 meq | EXTENDED_RELEASE_TABLET | Freq: Every day | ORAL | 3 refills | Status: DC

## 2016-08-22 MED ORDER — LISINOPRIL 10 MG PO TABS: 10.0000 mg | ORAL_TABLET | Freq: Every day | ORAL | 3 refills | Status: DC

## 2016-08-22 MED ORDER — CEPHALEXIN 500 MG PO CAPS: 500.0000 mg | ORAL_CAPSULE | Freq: Three times a day (TID) | ORAL | 0 refills | Status: DC

## 2016-08-22 MED ORDER — HYDROXYZINE PAMOATE 100 MG PO CAPS: 100.0000 mg | ORAL_CAPSULE | Freq: Three times a day (TID) | ORAL | 0 refills | Status: DC | PRN

## 2016-08-22 MED ORDER — HYDROXYZINE PAMOATE 100 MG PO CAPS: 100.0000 mg | ORAL_CAPSULE | Freq: Three times a day (TID) | ORAL | 1 refills | Status: DC | PRN

## 2016-08-22 MED ORDER — FUROSEMIDE 40 MG PO TABS: 40.0000 mg | ORAL_TABLET | Freq: Every day | ORAL | 0 refills | Status: DC

## 2016-08-22 NOTE — Progress Notes (Signed)
Patient ID: ADAM DEMARY, female    DOB: 10/01/1970, 46 y.o.   MRN: [id number]  PCP: Scot Jun, FNP  Chief Complaint  Patient presents with  . Leg Swelling  . Blurred Vision    Subjective:  HPI SARAFINA PUTHOFF is a 46 y.o. female presents to establish care and evaluation of his hypertension, lower extremity swelling, and blurring of vision. [place] is accompanied today by her husband and a sign language interpreter is hearing impaired. Her last office visit here at the clinic was 06/07/2015. She presents today to reestablish care. Medical problems include: Depression, anxiety, insomnia, chronic back pain, and hypertension.  Hypertension Kassie R Landenberger reports no home monitoring of blood pressure. Reports adherence to blood pressure medications. Reports efforts to adhere to low sodium diet. She is a nonsmoker. Denies any episodes of dizziness,headaches, shortness of breath, or chest pain. She reports some blurring of vision however feels this is more likely related to her need to obtain prescription corrective lenses. Reports that she wears readers however they're not helping with her decreasing in visual acuity.  Leg swelling Reports chronic bilateral leg swelling however feels that her left leg is worst in the right. Her left leg around her ankles are painful and feel extremely tight and at times are significantly. She denies insect bite. She does have visible varicose veins which appeared torturous. Reports leg swelling has been present for weeks. She has attempted to alleviate elevate her legs which has not helped with the swelling or pain.  Toenail fungus Reports chronic toenail fungus itching between her toes and on the bottom of her feet. Denies knowledge of ever being treated for Onychomycosis. She has not attempted any over-the-counter remedies for itching.  Insomnia  She reports that her insomnia is controlled with her hydroxyzine.   Depression  She reports  that her depression is controlled with escitalopram.  She needs refills on all of her medications today.  Social History   Social History  . Marital status: Married    Spouse name: N/A  . Number of children: N/A  . Years of education: N/A   Occupational History  . Not on file.   Social History Main Topics  . Smoking status: Current Every Day Smoker    Packs/day: 1.00    Types: Cigarettes  . Smokeless tobacco: Never Used  . Alcohol use No  . Drug use: No  . Sexual activity: Not on file   Other Topics Concern  . Not on file   Social History Narrative  . No narrative on file    No family history on file. Review of Systems See history of present illness Patient Active Problem List   Diagnosis Date Noted  . 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 Nail fungus 03/30/2015  . Toe pain 03/30/2015  . Insomnia 12/19/2014  . Pedal edema 11/06/2014  . Depression 11/06/2014  . Genital herpes 11/06/2014  . Back pain 11/06/2014    Allergies  Allergen Reactions  . Oxycodone   . Rifampin     Prior to Admission medications   Medication Sig Start Date End Date Taking? Authorizing Provider  acyclovir (ZOVIRAX) 200 MG capsule Take 1 capsule (200 mg total) by mouth 5 (five) times daily. 11/02/14   Micheline Chapman, NP  acyclovir ointment (ZOVIRAX) 5 % Apply 1 application topically every 3 (three) hours. 11/17/14   Micheline Chapman, NP  citalopram (CELEXA) 20 MG tablet TAKE 1 TABLET BY MOUTH EVERY  DAY 03/18/16   Micheline Chapman, NP  cyclobenzaprine (FLEXERIL) 10 MG tablet Take 1 tablet (10 mg total) by mouth at bedtime. 09/20/15   Harrie Foreman, MD  fluticasone (FLONASE) 50 MCG/ACT nasal spray Place 2 sprays into both nostrils daily. 05/18/15   Micheline Chapman, NP  hydrochlorothiazide (HYDRODIURIL) 25 MG tablet TAKE 1 TABLET EVERY DAY 09/13/15   Micheline Chapman, NP  HYDROcodone-acetaminophen (NORCO/VICODIN) 5-325 MG tablet Take 1 tablet by mouth every  6 (six) hours as needed for moderate pain. 06/11/16   Newt Minion, MD  indomethacin (INDOCIN) 25 MG capsule Take 1 capsule (25 mg total) by mouth 3 (three) times daily with meals. 08/22/15   Billy Fischer, MD  KLOR-CON M10 10 MEQ tablet TAKE 1 TABLET BY MOUTH EVERY DAY 03/18/16   Micheline Chapman, NP  nabumetone (RELAFEN) 750 MG tablet Take 750 mg by mouth 2 (two) times daily as needed.    [provider]  naproxen (NAPROSYN) 500 MG tablet Take 1 tablet (500 mg total) by mouth 2 (two) times daily with a meal. 08/22/15   Kindl, Nelda Severe, MD  omeprazole (PRILOSEC) 20 MG capsule TAKE 1 CAPSULE (20 MG TOTAL) BY MOUTH DAILY. 04/22/16   Micheline Chapman, NP  promethazine (PHENERGAN) 12.5 MG tablet Take 12.5 mg by mouth every 6 (six) hours as needed for nausea or vomiting.    [provider]  temazepam (RESTORIL) 7.5 MG capsule Take 1 capsule (7.5 mg total) by mouth at bedtime as needed for sleep. 12/19/14   Micheline Chapman, NP    Past Medical, Surgical Family and Social History reviewed and updated.    Objective:   Depression screen [facility] 2/9 08/22/2016 05/18/2015 03/27/2015 12/19/2014  Decreased Interest 2 0 0 0  Down, Depressed, Hopeless 2 0 0 0  PHQ - 2 Score 4 0 0 0  Altered sleeping 3 - - -  Tired, decreased energy 2 - - -  Change in appetite 0 - - -  Feeling bad or failure about yourself  0 - - -  Trouble concentrating 1 - - -  Moving slowly or fidgety/restless 2 - - -  Suicidal thoughts 0 - - -  PHQ-9 Score 12 - - -  Difficult doing work/chores Not difficult at all - - -   Today's Vitals   08/22/16 1439  BP: (!) 146/81  Pulse: 79  Resp: 16  Temp: 97.9 F (36.6 C)  TempSrc: Oral  SpO2: 99%  Weight: 182 lb (82.6 kg)  Height: 4\' 11"  (1.499 m)    Wt Readings from Last 3 Encounters:  08/22/16 182 lb (82.6 kg)  07/17/16 161 lb (73 kg)  05/09/16 161 lb (73 kg)   Physical Exam  Constitutional: She is oriented to person, place, and time. She appears  well-developed and well-nourished.  HENT:  Head: Normocephalic and atraumatic.  Eyes: Conjunctivae are normal. Pupils are equal, round, and reactive to light.  Neck: Normal range of motion. Neck supple.  Cardiovascular: Normal rate, regular rhythm, normal heart sounds and intact distal pulses.   Pulmonary/Chest: Effort normal and breath sounds normal.  Abdominal: Bowel sounds are normal. She exhibits no distension and no mass. There is no tenderness. There is no rebound and no guarding.  Musculoskeletal: She exhibits edema.  Bilateral lower extremities have +3 nonpitting edema. Left ankle is erythematous and tender to touch.  Neurological: She is alert and oriented to person, place, and time.  Skin:  Toenails, completely  white, she has dry scaly skin between toes, feet are malodorous.  Psychiatric: She has a normal mood and affect. Her behavior is normal. Judgment and thought content normal.    Visual Acuity Screening   Right eye Left eye Both eyes  Without correction: 20/40 20/30 20/30   With correction:          Assessment & Plan:  1. Bilateral lower extremity edema - CBC with Differential - COMPLETE METABOLIC PANEL WITH GFR - Brain natriuretic peptide -Start furosemide 40 mg daily x 10 days. -Take potassium chloride replacement 10 mEq daily while taking potassium.  2. Screening for diabetes mellitus - Hemoglobin A1c  3. Screening for thyroid disorder - Thyroid Panel With TSH  4. Phlebitis, questionable cellulitis of the left lower extremity -Take Keflex 500 mg 3 times a day 10 days. -Return for reevaluation in 2 weeks  5. Hypertension, stable but uncontrolled -Continue hydrochlorothiazide 25 mg once daily -I am adding lisinopril 10 mg once daily for added blood pressure control as you have been hypertensive today throughout visit.  6. Onychomycosis -Pending normal liver enzymes will start you Terbinafine (lotrisome) for total of 12 weeks for antifungal therapy.   7.  Depression, controlled -Continue Celexa 20 mg daily at bedtime.   8. Insomnia, controlled -Continue hydroxyzine 100 mg 3 times daily as needed              RTC: 2 weeks for recheck blood pressure and bilateral leg edema  [name]. [name], MSN, FNP-C [facility]  [name], [place] [address] [phone]

## 2016-08-22 NOTE — Patient Instructions (Addendum)
Her blood pressure continue taking hydrochlorothiazide 25 mg once daily. I am adding lisinopril 10 mg once daily for blood pressure control.  For leg swelling I'm adding Lasix 40 mg once daily only for 10 days. For what I suspect is phlebitis/cellulitis I am placing you on an antibiotic, Keflex 500 mg 3 times a day, for 10 days. Take medication with food.  For depression continue Celexa as prescribed. I am adding hydroxyzine 100 mg for nighttime sleep.  Once I receive your liver enzymes that I will prescribe an antifungal for your toenail fungus and foot fungus.  Please schedule an appointment for a ophthalmic exam.     Phlebitis Phlebitis is soreness and puffiness (swelling) in a vein. Follow these instructions at home:  Only take medicine as told by your doctor.  Raise (elevate) the affected limb on a pillow as told by your doctor.  Keep a warm pack on the affected vein as told by your doctor. Do not sleep with a heating pad.  Use special stockings or bandages around the area of the affected vein as told by your doctor. These will speed healing and keep the condition from coming back.  Talk to your doctor about all the medicines you take.  Get follow-up blood tests as told by your doctor.  If the phlebitis is in your legs: ? Avoid standing or resting for long periods. ? Keep your legs moving. Raise your legs when you sit or lie.  Do not smoke.  Follow-up with your doctor as told. Contact a doctor if:  You have strange bruises or bleeding.  Your puffiness or pain in the affected area is not getting better.  You are taking medicine to lessen puffiness (anti-inflammatory medicine), and you get belly pain.  You have a fever. Get help right away if:  The phlebitis gets worse and you have more pain, puffiness (swelling), or redness.  You have trouble breathing or have chest pain. This information is not intended to replace advice given to you by your health care provider.  Make sure you discuss any questions you have with your health care provider. Document Released: 02/13/2009 Document Revised: 08/03/2015 Document Reviewed: 11/02/2012 Elsevier Interactive Patient Education  2017 [employer].

## 2016-08-23 LAB — THYROID PANEL WITH TSH
Free Thyroxine Index: 1.5 (ref 1.4–3.8)
T3 UPTAKE: 26 % (ref 22–35)
T4, Total: 5.6 ug/dL (ref 4.5–12.0)
TSH: 0.95 m[IU]/L

## 2016-08-23 LAB — POCT URINALYSIS DIP (DEVICE)
Bilirubin Urine: NEGATIVE
GLUCOSE, UA: NEGATIVE mg/dL
Hgb urine dipstick: NEGATIVE
KETONES UR: NEGATIVE mg/dL
LEUKOCYTES UA: NEGATIVE
Nitrite: NEGATIVE
Protein, ur: NEGATIVE mg/dL
Specific Gravity, Urine: 1.015 (ref 1.005–1.030)
Urobilinogen, UA: 0.2 mg/dL (ref 0.0–1.0)
pH: 7 (ref 5.0–8.0)

## 2016-08-23 LAB — BRAIN NATRIURETIC PEPTIDE: BRAIN NATRIURETIC PEPTIDE: 58 pg/mL (ref <100)

## 2016-08-24 LAB — HEMOGLOBIN A1C
Hgb A1c MFr Bld: 4.6 % (ref <5.7)
Mean Plasma Glucose: 85 mg/dL

## 2016-08-26 ENCOUNTER — Telehealth: Payer: Self-pay | Admitting: Family Medicine

## 2016-08-26 MED ORDER — TERBINAFINE HCL 250 MG PO TABS: 250.0000 mg | ORAL_TABLET | Freq: Every day | ORAL | 0 refills | Status: DC

## 2016-08-26 NOTE — Telephone Encounter (Signed)
Please call the patient to advise that I have prescribed her terbinafine 250 mg once daily x 90 days for toenail fungus.  Her liver enzymes were normal.  She will need to return to the office in 6 weeks to have her liver enzymes rechecked. She should keep her follow-up visit with me this already scheduled to reevaluate her leg swelling. Please use the TTY through the interpreters service to call patient as she is hearing impaired

## 2016-08-27 NOTE — Telephone Encounter (Signed)
Patient notified and will pick up medication  

## 2016-08-27 NOTE — Telephone Encounter (Signed)
Left a vm for patient to callback regarding above message

## 2016-09-05 ENCOUNTER — Encounter: Payer: Self-pay | Admitting: Family Medicine

## 2016-09-05 ENCOUNTER — Ambulatory Visit (INDEPENDENT_AMBULATORY_CARE_PROVIDER_SITE_OTHER): Payer: Medicare HMO | Admitting: Family Medicine

## 2016-09-05 VITALS — BP 136/66 | HR 74 | Ht <= 58 in | Wt 178.5 lb

## 2016-09-05 DIAGNOSIS — M7989 Other specified soft tissue disorders: Secondary | ICD-10-CM | POA: Diagnosis not present

## 2016-09-05 MED ORDER — HYDROCHLOROTHIAZIDE 25 MG PO TABS: 50.0000 mg | ORAL_TABLET | Freq: Every day | ORAL | 3 refills | Status: DC

## 2016-09-05 MED ORDER — ACYCLOVIR 200 MG PO CAPS: 400.0000 mg | ORAL_CAPSULE | Freq: Two times a day (BID) | ORAL | 2 refills | Status: DC

## 2016-09-05 MED ORDER — POTASSIUM CHLORIDE CRYS ER 10 MEQ PO TBCR: 10.0000 meq | EXTENDED_RELEASE_TABLET | Freq: Every day | ORAL | 3 refills | Status: DC

## 2016-09-05 NOTE — Patient Instructions (Addendum)
HSV infection-take acyclovir 400 mg 3 times a day 10 days. May repeat if lesions reappear.  I'm increasing her hydrochlorothiazide from 25 mg daily to 50 mg daily to help with reducing leg swelling.  I do recommend that you apply the compression hose during the day as often as she can as tolerated.  I will be referring you to an ophthalmologist to have your vision evaluated.

## 2016-09-05 NOTE — Progress Notes (Signed)
Patient ID: Christy Price, female    DOB: 1970/12/17, 46 y.o.   MRN: [id number]  PCP: Scot Jun, FNP  Chief Complaint  Patient presents with  . Follow-up    2 week-- seeing better-- med made her drowsy blurred vision--- ankles very swollen    Subjective:  HPI Christy Price is a 46 y.o. female presents for evaluation of lower ankle swelling edema. She was treated during her last office visit for possible phlebitis with Keflex and placed on 10 days of Lasix for  lower extremity swelling. She reports that she discontinued taking Lasix a few days ago as she developed blurring of vision. During her prior office visit, it was recommended that she obtain a visual exam. She reports that her insurance will not cover an eye exam and she refuses to pay out of pocket for evaluation. She feels that the furosemide worsened her blurring of vision and she stopped taking the medication around 2 days ago. She completed all of the antibiotics. Reports that the pain is now in the right ankle instead of the left. Bilateral leg swelling has not improved. Reports in the past she was prescribed compression socks and socks were painful to wear and she discontinued use. Social History   Social History  . Marital status: Married    Spouse name: N/A  . Number of children: N/A  . Years of education: N/A   Occupational History  . Not on file.   Social History Main Topics  . Smoking status: Current Every Day Smoker    Packs/day: 1.00    Types: Cigarettes  . Smokeless tobacco: Never Used  . Alcohol use No  . Drug use: No  . Sexual activity: Not on file   Other Topics Concern  . Not on file   Social History Narrative  . No narrative on file   No family history on file. Review of Systems See history of present illness  Patient Active Problem List   Diagnosis Date Noted  . 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 Nail fungus 03/30/2015  . Toe pain 03/30/2015  .  Insomnia 12/19/2014  . Pedal edema 11/06/2014  . Depression 11/06/2014  . Genital herpes 11/06/2014  . Back pain 11/06/2014    Allergies  Allergen Reactions  . Oxycodone   . Rifampin     Prior to Admission medications   Medication Sig Start Date End Date Taking? Authorizing Provider  acyclovir (ZOVIRAX) 200 MG capsule Take 1 capsule (200 mg total) by mouth 5 (five) times daily. 11/02/14  Yes Micheline Chapman, NP  acyclovir ointment (ZOVIRAX) 5 % Apply 1 application topically every 3 (three) hours. 11/17/14  Yes Micheline Chapman, NP  cephALEXin (KEFLEX) 500 MG capsule Take 1 capsule (500 mg total) by mouth 3 (three) times daily. 08/22/16  Yes Scot Jun, FNP  citalopram (CELEXA) 20 MG tablet TAKE 1 TABLET BY MOUTH EVERY DAY 03/18/16  Yes Micheline Chapman, NP  cyclobenzaprine (FLEXERIL) 10 MG tablet Take 1 tablet (10 mg total) by mouth at bedtime. 09/20/15  Yes Harrie Foreman, MD  fluticasone [facility]) 50 MCG/ACT nasal spray Place 2 sprays into both nostrils daily. 05/18/15  Yes Micheline Chapman, NP  hydrochlorothiazide (HYDRODIURIL) 25 MG tablet TAKE 1 TABLET EVERY DAY 09/13/15  Yes Micheline Chapman, NP  HYDROcodone-acetaminophen (NORCO/VICODIN) 5-325 MG tablet Take 1 tablet by mouth every 6 (six) hours as needed for moderate pain. 06/11/16  Yes Meridee Score  V, MD  hydrOXYzine (VISTARIL) 100 MG capsule Take 1 capsule (100 mg total) by mouth 3 (three) times daily as needed for itching or anxiety. 08/22/16  Yes Scot Jun, FNP  indomethacin (INDOCIN) 25 MG capsule Take 1 capsule (25 mg total) by mouth 3 (three) times daily with meals. 08/22/15  Yes Kindl, Nelda Severe, MD  lisinopril (PRINIVIL,ZESTRIL) 10 MG tablet Take 1 tablet (10 mg total) by mouth daily. 08/22/16  Yes Scot Jun, FNP  nabumetone (RELAFEN) 750 MG tablet Take 750 mg by mouth 2 (two) times daily as needed.   Yes [provider]  naproxen (NAPROSYN) 500 MG tablet Take 1 tablet (500 mg total) by mouth  2 (two) times daily with a meal. 08/22/15  Yes Kindl, Nelda Severe, MD  omeprazole (PRILOSEC) 20 MG capsule TAKE 1 CAPSULE (20 MG TOTAL) BY MOUTH DAILY. 04/22/16  Yes Micheline Chapman, NP  potassium chloride (KLOR-CON M10) 10 MEQ tablet Take 1 tablet (10 mEq total) by mouth daily. 08/22/16  Yes Scot Jun, FNP  promethazine (PHENERGAN) 12.5 MG tablet Take 12.5 mg by mouth every 6 (six) hours as needed for nausea or vomiting.   Yes [provider]  terbinafine (LAMISIL) 250 MG tablet Take 1 tablet (250 mg total) by mouth daily. 08/26/16  Yes Scot Jun, FNP  furosemide (LASIX) 40 MG tablet Take 1 tablet (40 mg total) by mouth daily. 08/22/16 09/01/16  Scot Jun, FNP  Past Medical, Surgical Family and Social History reviewed and updated.  Objective:   Today's Vitals   09/05/16 1423  BP: 136/66  Pulse: 74  SpO2: 97%  Weight: 178 lb 8 oz (81 kg)  Height: 4\' 6"  (1.372 m)   Today's Vitals   09/05/16 1423  Weight: 178 lb 8 oz (81 kg)  Height: 4\' 11"  (1.499 m)    Wt Readings from Last 3 Encounters:  09/05/16 178 lb 8 oz (81 kg)  08/22/16 182 lb (82.6 kg)  07/17/16 161 lb (73 kg)   Physical Exam  Constitutional: She is oriented to person, place, and time. She appears well-developed and well-nourished.  HENT:  Head: Normocephalic.  Neck: Normal range of motion. Neck supple.  Cardiovascular: Normal rate, regular rhythm, normal heart sounds and intact distal pulses.   Pulmonary/Chest: Effort normal and breath sounds normal.  Musculoskeletal: She exhibits edema.  Tenderness around medial lateral malleolus right ankles Generalized non-pitting mild swelling   Neurological: She is alert and oriented to person, place, and time.  Skin: Skin is warm and dry.  Psychiatric: Her behavior is normal. Judgment and thought content normal. Her mood appears anxious.   Assessment & Plan:  1. Leg swelling, this appears to be a chronic problem of lower extremity leg swelling.  She has been noncompliant with recommended therapies including wearing compression socks. She did agree today to try compression stockings see if she could tolerate wearing stockings oppose to socks. -Increase hydrochlorothiazide from 25 mg daily to 50 mg daily for fluid retention. - Recommend compression stockings daily   2. Blurred vision  -Ambulatory referral to ophthalmology  RTC: 6 weeks for evaluation of leg swelling.  [name]. [name], MSN, FNP-C [facility]  [name] [place], [place] [address] [phone]

## 2016-09-16 ENCOUNTER — Ambulatory Visit (INDEPENDENT_AMBULATORY_CARE_PROVIDER_SITE_OTHER): Payer: Medicare HMO | Admitting: Family

## 2016-09-24 DIAGNOSIS — H01002 Unspecified blepharitis right lower eyelid: Secondary | ICD-10-CM | POA: Diagnosis not present

## 2016-09-24 DIAGNOSIS — H0100A Unspecified blepharitis right eye, upper and lower eyelids: Secondary | ICD-10-CM | POA: Insufficient documentation

## 2016-09-24 DIAGNOSIS — H16223 Keratoconjunctivitis sicca, not specified as Sjogren's, bilateral: Secondary | ICD-10-CM | POA: Insufficient documentation

## 2016-09-24 DIAGNOSIS — H43393 Other vitreous opacities, bilateral: Secondary | ICD-10-CM | POA: Diagnosis not present

## 2016-09-24 DIAGNOSIS — H01001 Unspecified blepharitis right upper eyelid: Secondary | ICD-10-CM | POA: Diagnosis not present

## 2016-09-24 DIAGNOSIS — H524 Presbyopia: Secondary | ICD-10-CM | POA: Diagnosis not present

## 2016-09-24 DIAGNOSIS — H01004 Unspecified blepharitis left upper eyelid: Secondary | ICD-10-CM | POA: Diagnosis not present

## 2016-09-24 DIAGNOSIS — H0100B Unspecified blepharitis left eye, upper and lower eyelids: Secondary | ICD-10-CM

## 2016-09-24 DIAGNOSIS — H01005 Unspecified blepharitis left lower eyelid: Secondary | ICD-10-CM | POA: Diagnosis not present

## 2016-10-07 ENCOUNTER — Ambulatory Visit (INDEPENDENT_AMBULATORY_CARE_PROVIDER_SITE_OTHER): Payer: Medicare HMO | Admitting: Family Medicine

## 2016-10-07 ENCOUNTER — Encounter: Payer: Self-pay | Admitting: Family Medicine

## 2016-10-07 VITALS — BP 132/78 | HR 74 | Temp 97.3°F | Resp 16 | Ht <= 58 in | Wt 177.6 lb

## 2016-10-07 DIAGNOSIS — M25511 Pain in right shoulder: Secondary | ICD-10-CM | POA: Diagnosis not present

## 2016-10-07 DIAGNOSIS — M25572 Pain in left ankle and joints of left foot: Secondary | ICD-10-CM

## 2016-10-07 DIAGNOSIS — G8929 Other chronic pain: Secondary | ICD-10-CM

## 2016-10-07 DIAGNOSIS — R609 Edema, unspecified: Secondary | ICD-10-CM

## 2016-10-07 DIAGNOSIS — R252 Cramp and spasm: Secondary | ICD-10-CM | POA: Diagnosis not present

## 2016-10-07 DIAGNOSIS — I868 Varicose veins of other specified sites: Secondary | ICD-10-CM

## 2016-10-07 DIAGNOSIS — E559 Vitamin D deficiency, unspecified: Secondary | ICD-10-CM | POA: Diagnosis not present

## 2016-10-07 DIAGNOSIS — M67979 Unspecified disorder of synovium and tendon, unspecified ankle and foot: Secondary | ICD-10-CM

## 2016-10-07 DIAGNOSIS — I872 Venous insufficiency (chronic) (peripheral): Secondary | ICD-10-CM

## 2016-10-07 DIAGNOSIS — M25571 Pain in right ankle and joints of right foot: Secondary | ICD-10-CM | POA: Diagnosis not present

## 2016-10-07 LAB — POCT URINALYSIS DIP (DEVICE)
BILIRUBIN URINE: NEGATIVE
Glucose, UA: NEGATIVE mg/dL
Ketones, ur: NEGATIVE mg/dL
LEUKOCYTES UA: NEGATIVE
Nitrite: NEGATIVE
Protein, ur: NEGATIVE mg/dL
Specific Gravity, Urine: 1.025 (ref 1.005–1.030)
UROBILINOGEN UA: 0.2 mg/dL (ref 0.0–1.0)
pH: 5.5 (ref 5.0–8.0)

## 2016-10-07 LAB — BASIC METABOLIC PANEL
BUN: 14 mg/dL (ref 7–25)
CHLORIDE: 102 mmol/L (ref 98–110)
CO2: 22 mmol/L (ref 20–31)
Calcium: 9.1 mg/dL (ref 8.6–10.2)
Creat: 0.81 mg/dL (ref 0.50–1.10)
GLUCOSE: 84 mg/dL (ref 65–99)
POTASSIUM: 4.2 mmol/L (ref 3.5–5.3)
SODIUM: 136 mmol/L (ref 135–146)

## 2016-10-07 NOTE — Progress Notes (Signed)
Patient ID: Christy Price, female    DOB: May 31, 1970, 46 y.o.   MRN: [id number]  PCP: Scot Jun, FNP  Chief Complaint  Patient presents with  . Follow-up    1 month leg swelling  . Shoulder Pain    Subjective:  HPI-interpreter present at today's visit.  Christy Price is a 46 y.o. female presents for evaluation of chronic bilateral leg and feet swelling. Christy Price has been seen in office several times over the prior 2 months with a compliant of bilateral lower extremity swelling. This problem is not new and has been ongoing for several years. She was recently trial with Lasix and Hydrochlorothiazide neither of which mildly relieved the swelling. Christy Price has declined attempting relief of swelling by trialing compression socks/hose as she doesn't like the discomfort from pressure to her legs. She reports elevating legs while watching TV to prevent worsening swelling. Christy Price reports residual right shoulder pain, from a right rotator cuff tear. She is followed by Dr. Sharol Given, at [facility] for this problem. She is requesting a refill on her hydromorphone-acetaminophen for pain which was originally prescribed by Dr. Sharol Given.  Social History   Social History  . Marital status: Married    Spouse name: N/A  . Number of children: N/A  . Years of education: N/A   Occupational History  . Not on file.   Social History Main Topics  . Smoking status: Current Every Day Smoker    Packs/day: 1.00    Types: Cigarettes  . Smokeless tobacco: Never Used  . Alcohol use No  . Drug use: No  . Sexual activity: Not on file   Other Topics Concern  . Not on file   Social History Narrative  . No narrative on file   Review of Systems See HPI  Patient Active Problem List   Diagnosis Date Noted  . 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 Nail fungus 03/30/2015  . Toe pain 03/30/2015  . Insomnia 12/19/2014  . Pedal edema 11/06/2014  . Depression 11/06/2014  .  Genital herpes 11/06/2014  . Back pain 11/06/2014    Allergies  Allergen Reactions  . Oxycodone   . Rifampin     Prior to Admission medications   Medication Sig Start Date End Date Taking? Authorizing Provider  acyclovir (ZOVIRAX) 200 MG capsule Take 2 capsules (400 mg total) by mouth 2 (two) times daily. 09/05/16  Yes Scot Jun, FNP  citalopram (CELEXA) 20 MG tablet TAKE 1 TABLET BY MOUTH EVERY DAY 03/18/16  Yes Micheline Chapman, NP  cyclobenzaprine (FLEXERIL) 10 MG tablet Take 1 tablet (10 mg total) by mouth at bedtime. 09/20/15  Yes Harrie Foreman, MD  fluticasone [facility]) 50 MCG/ACT nasal spray Place 2 sprays into both nostrils daily. 05/18/15  Yes Micheline Chapman, NP  hydrochlorothiazide (HYDRODIURIL) 25 MG tablet Take 2 tablets (50 mg total) by mouth daily. 09/05/16  Yes Scot Jun, FNP  HYDROcodone-acetaminophen (NORCO/VICODIN) 5-325 MG tablet Take 1 tablet by mouth every 6 (six) hours as needed for moderate pain. 06/11/16  Yes Newt Minion, MD  hydrOXYzine (VISTARIL) 100 MG capsule Take 1 capsule (100 mg total) by mouth 3 (three) times daily as needed for itching or anxiety. 08/22/16  Yes Scot Jun, FNP  indomethacin (INDOCIN) 25 MG capsule Take 1 capsule (25 mg total) by mouth 3 (three) times daily with meals. 08/22/15  Yes Kindl, Nelda Severe, MD  lisinopril (PRINIVIL,ZESTRIL) 10 MG tablet Take  1 tablet (10 mg total) by mouth daily. 08/22/16  Yes Scot Jun, FNP  nabumetone (RELAFEN) 750 MG tablet Take 750 mg by mouth 2 (two) times daily as needed.   Yes [provider]  naproxen (NAPROSYN) 500 MG tablet Take 1 tablet (500 mg total) by mouth 2 (two) times daily with a meal. 08/22/15  Yes Kindl, Nelda Severe, MD  omeprazole (PRILOSEC) 20 MG capsule TAKE 1 CAPSULE (20 MG TOTAL) BY MOUTH DAILY. 04/22/16  Yes Micheline Chapman, NP  potassium chloride (KLOR-CON M10) 10 MEQ tablet Take 1 tablet (10 mEq total) by mouth daily. 09/05/16  Yes Scot Jun,  FNP  promethazine (PHENERGAN) 12.5 MG tablet Take 12.5 mg by mouth every 6 (six) hours as needed for nausea or vomiting.   Yes [provider]  terbinafine (LAMISIL) 250 MG tablet Take 1 tablet (250 mg total) by mouth daily. 08/26/16  Yes Scot Jun, FNP  furosemide (LASIX) 40 MG tablet Take 1 tablet (40 mg total) by mouth daily. 08/22/16 09/01/16  Scot Jun, FNP    Past Medical, Surgical Family and Social History reviewed and updated.    Objective:   Today's Vitals   10/07/16 1436  BP: 132/78  Pulse: 74  Resp: 16  Temp: (!) 97.3 F (36.3 C)  TempSrc: Oral  SpO2: 97%  Weight: 177 lb 9.6 oz (80.6 kg)  Height: 4\' 6"  (1.372 m)    Wt Readings from Last 3 Encounters:  10/07/16 177 lb 9.6 oz (80.6 kg)  09/05/16 178 lb 8 oz (81 kg)  08/22/16 182 lb (82.6 kg)    Physical Exam  Constitutional: She is oriented to person, place, and time. She appears well-developed and well-nourished.  HENT:  Head: Normocephalic and atraumatic.  Eyes: Pupils are equal, round, and reactive to light. Conjunctivae are normal.  Neck: Normal range of motion. Neck supple.  Cardiovascular: Normal rate, regular rhythm, normal heart sounds and intact distal pulses.   Pulmonary/Chest: Effort normal and breath sounds normal.  Musculoskeletal: She exhibits edema.  +3 nonpitting edema, bilateral lower extremities. Palpable bony tenderness along the left medial malleolus region. Thin spider veins noted on posterior legs  Neurological: She is alert and oriented to person, place, and time.  Skin: Skin is warm and dry.  Psychiatric: She has a normal mood and affect. Her behavior is normal. Judgment and thought content normal.   Assessment & Plan:  1. Muscle cramps at night - VITAMIN D 25 Hydroxy (Vit-D Deficiency, Fractures) - Basic metabolic panel  2. Chronic pain of both ankles - Ambulatory referral to Orthopedic Surgery  3. Achilles tendon disorder, unspecified laterality -  Ambulatory referral to Orthopedic Surgery  4. Spider varicose vein - Ambulatory referral to Vascular Surgery  5. Edema of both lower legs due to peripheral venous insufficiency - Ambulatory referral to Vascular Surgery  6. Right Shoulder Pain -Deferred patient to follow-up with Dr. Jess Barters office to obtain a refill of hydromorphone-   RTC: 6 months for routine wellness and hypertension follow-up  [name]. [name], MSN, FNP-C [facility]  [name] [place], [place] [address] [phone]

## 2016-10-07 NOTE — Patient Instructions (Signed)
Reduce hydrochlorothiazide 25 mg once daily. All other medication remains the same. I have placed your referrals to orthopedic surgery and vascular.    Ankle Pain Many things can cause ankle pain, including an injury to the area and overuse of the ankle.The ankle joint holds your body weight and allows you to move around. Ankle pain can occur on either side or the back of one ankle or both ankles. Ankle pain may be sharp and burning or dull and aching. There may be tenderness, stiffness, redness, or warmth around the ankle. Follow these instructions at home: Activity  Rest your ankle as told by your health care provider. Avoid any activities that cause ankle pain.  Do exercises as told by your health care provider.  Ask your health care provider if you can drive. Using a brace, a bandage, or crutches  If you were given a brace: ? Wear it as told by your health care provider. ? Remove it when you take a bath or a shower. ? Try not to move your ankle very much, but wiggle your toes from time to time. This helps to prevent swelling.  If you were given an elastic bandage: ? Remove it when you take a bath or a shower. ? Try not to move your ankle very much, but wiggle your toes from time to time. This helps to prevent swelling. ? Adjust the bandage to make it more comfortable if it feels too tight. ? Loosen the bandage if you have numbness or tingling in your foot or if your foot turns cold and blue.  If you have crutches, use them as told by your health care provider. Continue to use them until you can walk without feeling pain in your ankle. Managing pain, stiffness, and swelling  Raise (elevate) your ankle above the level of your heart while you are sitting or lying down.  If directed, apply ice to the area: ? Put ice in a plastic bag. ? Place a towel between your skin and the bag. ? Leave the ice on for 20 minutes, 2-3 times per day. General instructions  Keep all follow-up  visits as told by your health care provider. This is important.  Record this information that may be helpful for you and your health care provider: ? How often you have ankle pain. ? Where the pain is located. ? What the pain feels like.  Take over-the-counter and prescription medicines only as told by your health care provider. Contact a health care provider if:  Your pain gets worse.  Your pain is not relieved with medicines.  You have a fever or chills.  You are having more trouble with walking.  You have new symptoms. Get help right away if:  Your foot, leg, toes, or ankle tingles or becomes numb.  Your foot, leg, toes, or ankle becomes swollen.  Your foot, leg, toes, or ankle turns pale or blue. This information is not intended to replace advice given to you by your health care provider. Make sure you discuss any questions you have with your health care provider. Document Released: 08/15/2009 Document Revised: 10/27/2015 Document Reviewed: 09/27/2014 Elsevier Interactive Patient Education  2017 [employer]  Lymphedema Lymphedema is swelling that is caused by the abnormal collection of lymph under the skin. Lymph is fluid from the tissues in your body that travels in the lymphatic system. This system is part of the immune system and includes lymph nodes and lymph vessels. The lymph vessels collect and carry the  excess fluid, fats, proteins, and wastes from the tissues of the body to the bloodstream. This system also works to clean and remove bacteria and waste products from the body. Lymphedema occurs when the lymphatic system is blocked. When the lymph vessels or lymph nodes are blocked or damaged, lymph does not drain properly, causing an abnormal buildup of lymph. This leads to swelling in the arms or legs. Lymphedema cannot be cured by medicines, but various methods can be used to help reduce the swelling. What are the causes? There are two types of lymphedema. Primary  lymphedema is caused by the absence or abnormality of the lymph vessel at birth. Secondary lymphedema is more common. It occurs when the lymph vessel is damaged or blocked. Common causes of lymph vessel blockage include:  Skin infection, such as cellulitis.  Infection by parasites (filariasis).  Injury.  Cancer.  Radiation therapy.  Formation of scar tissue.  Surgery.  What are the signs or symptoms? Symptoms of this condition include:  Swelling of the arm or leg.  A heavy or tight feeling in the arm or leg.  Swelling of the feet, toes, or fingers. Shoes or rings may fit more tightly than before.  Redness of the skin over the affected area.  Limited movement of the affected limb.  Sensitivity to touch or discomfort in the affected limb.  How is this diagnosed? This condition may be diagnosed with:  A physical exam.  Medical history.  Bioimpedance spectroscopy. In this test, painless electrical currents are used to measure fluid levels in your body.  Imaging tests, such as: ? Lymphoscintigraphy. In this test, a low dose of a radioactive substance is injected to trace the flow of lymph through the lymph vessels. ? MRI. ? CT scan. ? Duplex ultrasound. This test uses sound waves to produce images of the vessels and the blood flow on a screen. ? Lymphangiography. In this test, a contrast dye is injected into the lymph vessel to help show blockages.  How is this treated? Treatment for this condition may depend on the cause. Treatment may include:  Exercise. Certain exercises can help fluid move out of the affected limb.  Massage. Gentle massage of the affected limb can help move the fluid out of the area.  Compression. Various methods may be used to apply pressure to the affected limb in order to reduce the swelling. ? Wearing compression stockings or sleeves on the affected limb. ? Bandaging the affected limb. ? Using an external pump that is attached to a sleeve  that alternates between applying pressure and releasing pressure.  Surgery. This is usually only done for severe cases. For example, surgery may be done if you have trouble moving the limb or if the swelling does not get better with other treatments.  If an underlying condition is causing the lymphedema, treatment for that condition is needed. For example, antibiotic medicines may be used to treat an infection. Follow these instructions at home: Activities  Exercise regularly as directed by your health care provider.  Do not sit with your legs crossed.  When possible, keep the affected limb raised (elevated) above the level of your heart.  Avoid carrying things with an arm that is affected by lymphedema.  Remember that the affected area is more likely to become injured or infected.  Take these steps to help prevent infection: ? Keep the affected area clean and dry. ? Protect your skin from cuts. For example, you should use gloves while cooking or gardening.  Do not walk barefoot. If you shave the affected area, use an Copy. General instructions  Take medicines only as directed by your health care provider.  Eat a healthy diet that includes a lot of fruits and vegetables.  Do not wear tight clothes, shoes, or jewelry.  Do not use heating pads over the affected area.  Avoid having blood pressure checked on the affected limb.  Keep all follow-up visits as directed by your health care provider. This is important. Contact a health care provider if:  You continue to have swelling in your limb.  You have a fever.  You have a cut that does not heal.  You have redness or pain in the affected area.  You have new swelling in your limb that comes on suddenly.  You develop purplish spots or sores (lesions) on your limb. Get help right away if:  You have a skin rash.  You have chills or sweats.  You have shortness of breath. This information is not intended to replace  advice given to you by your health care provider. Make sure you discuss any questions you have with your health care provider. Document Released: 12/23/2006 Document Revised: 11/02/2015 Document Reviewed: 02/02/2014 Elsevier Interactive Patient Education  [employer].

## 2016-10-08 LAB — VITAMIN D 25 HYDROXY (VIT D DEFICIENCY, FRACTURES): VIT D 25 HYDROXY: 34 ng/mL (ref 30–100)

## 2016-10-09 MED ORDER — VITAMIN D (ERGOCALCIFEROL) 1.25 MG (50000 UNIT) PO CAPS: 50000.0000 [IU] | ORAL_CAPSULE | ORAL | 2 refills | Status: DC

## 2016-10-15 ENCOUNTER — Encounter: Payer: Self-pay | Admitting: Vascular Surgery

## 2016-10-23 ENCOUNTER — Encounter: Payer: Medicare HMO | Admitting: Vascular Surgery

## 2016-11-30 ENCOUNTER — Other Ambulatory Visit: Payer: Self-pay | Admitting: Family Medicine

## 2016-12-06 ENCOUNTER — Telehealth (INDEPENDENT_AMBULATORY_CARE_PROVIDER_SITE_OTHER): Payer: Self-pay | Admitting: Orthopedic Surgery

## 2016-12-06 NOTE — Telephone Encounter (Signed)
Last 3 ov notes faxed to [facility]

## 2016-12-09 ENCOUNTER — Telehealth (INDEPENDENT_AMBULATORY_CARE_PROVIDER_SITE_OTHER): Payer: Self-pay | Admitting: Orthopedic Surgery

## 2016-12-09 NOTE — Telephone Encounter (Signed)
I called and spoke with Beth to advise of message below.

## 2016-12-09 NOTE — Telephone Encounter (Signed)
Beth from [facility] called asking if the patient could possible do light cleaning duties? CB # [phone]

## 2016-12-09 NOTE — Telephone Encounter (Signed)
Patient may do light duty cleaning with no overhead activities.

## 2016-12-12 ENCOUNTER — Ambulatory Visit (INDEPENDENT_AMBULATORY_CARE_PROVIDER_SITE_OTHER): Payer: Medicare HMO | Admitting: Orthopedic Surgery

## 2016-12-12 ENCOUNTER — Encounter (INDEPENDENT_AMBULATORY_CARE_PROVIDER_SITE_OTHER): Payer: Self-pay | Admitting: Orthopedic Surgery

## 2016-12-12 DIAGNOSIS — M75111 Incomplete rotator cuff tear or rupture of right shoulder, not specified as traumatic: Secondary | ICD-10-CM | POA: Diagnosis not present

## 2016-12-12 DIAGNOSIS — M79662 Pain in left lower leg: Secondary | ICD-10-CM | POA: Insufficient documentation

## 2016-12-12 DIAGNOSIS — M79661 Pain in right lower leg: Secondary | ICD-10-CM

## 2016-12-12 MED ORDER — CELECOXIB 200 MG PO CAPS: 200.0000 mg | ORAL_CAPSULE | Freq: Two times a day (BID) | ORAL | 3 refills | Status: DC

## 2016-12-12 NOTE — Progress Notes (Signed)
Office Visit Note   Patient: Christy Price           Date of Birth: Apr 23, 1970           MRN: [id number] Visit Date: 12/12/2016              Requested by: Scot Jun, [place], [place] [address] PCP: Scot Jun, FNP  Chief Complaint  Patient presents with  . Right Shoulder - Follow-up, Pain  . Right Ankle - Follow-up, Pain  . Left Ankle - Follow-up, Pain      HPI: Patient is a 46 year old woman who is status post debridement of the rotator cuff right shoulder.  Patient states she has made multiple attempts to try to pursue physical therapy but she states he could not match her schedule. Patient complains of start up stiffness and pain including both lower extremities which she states is globally. She states this does get better with time.  Assessment & Plan: Visit Diagnoses:  1. 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 Pain in both lower legs     Plan: Patient is given a prescription for physical therapy at [facility]. She is given a prescription for Celebrex. She states she has tried Relafen in the past without any relief. We will reevaluate in 4 weeks.  Follow-Up Instructions: Return in about 4 weeks (around 01/09/2017).   Ortho Exam  Patient is alert, oriented, no adenopathy, well-dressed, normal affect, normal respiratory effort. Examination patient has approximately 120 of abduction and flexion of the right shoulder she has good internal and external rotation but all range of motion is tender there is no crepitation there is no redness no cellulitis no signs of infection. She is globally tender to palpation around the shoulder but no focal area of tenderness no dystrophic changes. Examination of both lower extremities she is globally tender to palpation around both ankles. She has no crepitation with range of motion of the ankle or subtalar joint. Both lower extremities are neurovascularly intact.  Imaging: No  results found. No images are attached to the encounter.  Labs: Lab Results  Component Value Date   HGBA1C 4.6 08/22/2016   ESRSEDRATE 3 07/24/2007   REPTSTATUS 09/19/2007 FINAL 09/18/2007   REPTSTATUS 09/22/2007 FINAL 09/18/2007   GRAMSTAIN  08/11/2007    WBC PRESENT, PREDOMINANTLY PMN NO ORGANISMS SEEN CYTOSPIN SLIDE Gram Stain Report Called to,Read Back By and Verified With: BROUSSARD E.,RN 08/11/07 2055 BY DUNCANJ   GRAMSTAIN  08/11/2007    CYTOSPIN WBC PRESENT,BOTH PMN AND MONONUCLEAR NO ORGANISMS SEEN   GRAMSTAIN  08/11/2007    WBC PRESENT, PREDOMINANTLY PMN NO ORGANISMS SEEN CYTOSPIN SLIDE Gram Stain Report Called to,Read Back By and Verified With: BROUSSARD E.,RN 08/11/07 2055 BY DUNCANJ   CULT  09/18/2007    NO SALMONELLA, SHIGELLA, CAMPYLOBACTER, OR YERSINIA ISOLATED Note: REDUCED NORMAL FLORA PRESENT    Orders:  No orders of the defined types were placed in this encounter.  Meds ordered this encounter  Medications  . DISCONTD: celecoxib (CELEBREX) 200 MG capsule    Sig: Take 1 capsule (200 mg total) by mouth 2 (two) times daily.    Dispense:  60 capsule    Refill:  3  . celecoxib (CELEBREX) 200 MG capsule    Sig: Take 1 capsule (200 mg total) by mouth 2 (two) times daily.    Dispense:  60 capsule    Refill:  3     Procedures:  No procedures performed  Clinical Data: No additional findings.  ROS:  All other systems negative, except as noted in the HPI. Review of Systems  Objective: Vital Signs: There were no vitals taken for this visit.  Specialty Comments:  No specialty comments available.  PMFS History: Patient Active Problem List   Diagnosis Date Noted  . Pain in both lower legs 12/12/2016  . 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 Nail fungus 03/30/2015  . Toe pain 03/30/2015  . Insomnia 12/19/2014  . Pedal edema 11/06/2014  . Depression 11/06/2014  . Genital herpes 11/06/2014  . Back pain 11/06/2014   Past  Medical History:  Diagnosis Date  . Cancer (HCC)     hx cervical cx   . Hypertension   . Meningitis     History reviewed. No pertinent family history.  Past Surgical History:  Procedure Laterality Date  . SHUNT REPLACEMENT     Social History   Occupational History  . Not on file.   Social History Main Topics  . Smoking status: Current Every Day Smoker    Packs/day: 1.00    Types: Cigarettes  . Smokeless tobacco: Never Used  . Alcohol use No  . Drug use: No  . Sexual activity: Not on file

## 2017-01-09 ENCOUNTER — Ambulatory Visit (INDEPENDENT_AMBULATORY_CARE_PROVIDER_SITE_OTHER): Payer: Medicare HMO | Admitting: Orthopedic Surgery

## 2017-01-13 ENCOUNTER — Encounter (INDEPENDENT_AMBULATORY_CARE_PROVIDER_SITE_OTHER): Payer: Medicare HMO | Admitting: Ophthalmology

## 2017-01-14 NOTE — Progress Notes (Deleted)
[facility] Note  01/15/2017     CHIEF COMPLAINT Patient presents for No chief complaint on file.   HISTORY OF PRESENT ILLNESS: Christy Price is a 46 y.o. female who presents to the clinic today for:     Referring physician: Scot Jun, [place], [place] [address]  HISTORICAL INFORMATION:   Selected notes from the [facility] Referral from Molli Barrows, FNP for concern of blurred [facility] OU;  LEE-  Ocular Hx-  PMH- deaf,    CURRENT MEDICATIONS: No current outpatient medications on file. (Ophthalmic Drugs)   No current facility-administered medications for this visit.  (Ophthalmic Drugs)   Current Outpatient Medications (Other)  Medication Sig   acyclovir (ZOVIRAX) 200 MG capsule Take 2 capsules (400 mg total) by mouth 2 (two) times daily.   celecoxib (CELEBREX) 200 MG capsule Take 1 capsule (200 mg total) by mouth 2 (two) times daily.   citalopram (CELEXA) 20 MG tablet TAKE 1 TABLET BY MOUTH EVERY DAY   cyclobenzaprine (FLEXERIL) 10 MG tablet Take 1 tablet (10 mg total) by mouth at bedtime.   fluticasone (FLONASE) 50 MCG/ACT nasal spray Place 2 sprays into both nostrils daily.   furosemide (LASIX) 40 MG tablet Take 1 tablet (40 mg total) by mouth daily.   hydrochlorothiazide (HYDRODIURIL) 25 MG tablet Take 2 tablets (50 mg total) by mouth daily.   HYDROcodone-acetaminophen (NORCO/VICODIN) 5-325 MG tablet Take 1 tablet by mouth every 6 (six) hours as needed for moderate pain.   hydrOXYzine (VISTARIL) 100 MG capsule Take 1 capsule (100 mg total) by mouth 3 (three) times daily as needed for itching or anxiety.   indomethacin (INDOCIN) 25 MG capsule Take 1 capsule (25 mg total) by mouth 3 (three) times daily with meals.   lisinopril (PRINIVIL,ZESTRIL) 10 MG tablet Take 1 tablet (10 mg total) by mouth daily.   nabumetone (RELAFEN) 750 MG tablet Take 750 mg by mouth 2 (two) times daily as needed.    naproxen (NAPROSYN) 500 MG tablet Take 1 tablet (500 mg total) by mouth 2 (two) times daily with a meal.   omeprazole (PRILOSEC) 20 MG capsule TAKE 1 CAPSULE (20 MG TOTAL) BY MOUTH DAILY.   potassium chloride (KLOR-CON M10) 10 MEQ tablet Take 1 tablet (10 mEq total) by mouth daily.   promethazine (PHENERGAN) 12.5 MG tablet Take 12.5 mg by mouth every 6 (six) hours as needed for nausea or vomiting.   terbinafine (LAMISIL) 250 MG tablet TAKE 1 TABLET BY MOUTH EVERY DAY   Vitamin D, Ergocalciferol, (DRISDOL) 50000 units CAPS capsule Take 1 capsule (50,000 Units total) by mouth every 7 (seven) days.   No current facility-administered medications for this visit.  (Other)      REVIEW OF SYSTEMS:    ALLERGIES Allergies  Allergen Reactions   Oxycodone    Rifampin     PAST MEDICAL HISTORY Past Medical History:  Diagnosis Date   Cancer ([facility])     hx cervical cx    Hypertension    Meningitis    Past Surgical History:  Procedure Laterality Date   SHUNT REPLACEMENT      FAMILY HISTORY No family history on file.  SOCIAL HISTORY Social History   Tobacco Use   Smoking status: Current Every Day Smoker    Packs/day: 1.00    Types: Cigarettes   Smokeless tobacco: Never Used  Substance Use Topics   Alcohol use: No    Alcohol/week: 0.0 oz  Drug use: No         OPHTHALMIC EXAM:  Not recorded      IMAGING AND PROCEDURES  Imaging and Procedures for 01/14/17           ASSESSMENT/PLAN:    ICD-10-CM   1. Retinal edema H35.81 OCT, Retina - OU - Both Eyes    1.  2.  3.  Ophthalmic Meds Ordered this visit:  No orders of the defined types were placed in this encounter.      No Follow-up on file.  There are no Patient Instructions on file for this visit.   Explained the diagnoses, plan, and follow up with the patient and they expressed understanding.  Patient expressed understanding of the importance of proper follow up care.   Gardiner Sleeper, M.D., Ph.D. Diseases & Surgery of the Retina and [place] 01/14/17     Abbreviations: M myopia (nearsighted); A astigmatism; H hyperopia (farsighted); P presbyopia; Mrx spectacle prescription;  CTL contact lenses; OD right eye; OS left eye; OU both eyes  XT exotropia; ET esotropia; PEK punctate epithelial keratitis; PEE punctate epithelial erosions; DES dry eye syndrome; MGD meibomian gland dysfunction; ATs artificial tears; PFAT's preservative free artificial tears; [place] nuclear sclerotic cataract; PSC posterior subcapsular cataract; ERM epi-retinal membrane; PVD posterior vitreous detachment; RD retinal detachment; DM diabetes mellitus; DR diabetic retinopathy; NPDR non-proliferative diabetic retinopathy; PDR proliferative diabetic retinopathy; CSME clinically significant macular edema; DME diabetic macular edema; dbh dot blot hemorrhages; CWS cotton wool spot; POAG primary open angle glaucoma; C/D cup-to-disc ratio; HVF humphrey visual field; GVF goldmann visual field; OCT optical coherence tomography; IOP intraocular pressure; BRVO Branch retinal vein occlusion; CRVO central retinal vein occlusion; CRAO central retinal artery occlusion; BRAO branch retinal artery occlusion; RT retinal tear; SB scleral buckle; PPV pars plana vitrectomy; VH Vitreous hemorrhage; PRP panretinal laser photocoagulation; IVK intravitreal kenalog; VMT vitreomacular traction; MH Macular hole;  NVD neovascularization of the disc; NVE neovascularization elsewhere; AREDS age related eye disease study; ARMD age related macular degeneration; POAG primary open angle glaucoma; EBMD epithelial/anterior basement membrane dystrophy; ACIOL anterior chamber intraocular lens; IOL intraocular lens; PCIOL posterior chamber intraocular lens; Phaco/IOL phacoemulsification with intraocular lens placement; [place] photorefractive keratectomy; LASIK laser assisted in situ keratomileusis; HTN hypertension; DM  diabetes mellitus; COPD chronic obstructive pulmonary disease

## 2017-01-15 ENCOUNTER — Encounter (INDEPENDENT_AMBULATORY_CARE_PROVIDER_SITE_OTHER): Payer: Medicare HMO | Admitting: Ophthalmology

## 2017-02-12 ENCOUNTER — Other Ambulatory Visit: Payer: Self-pay

## 2017-02-12 MED ORDER — LISINOPRIL 10 MG PO TABS: 10.0000 mg | ORAL_TABLET | Freq: Every day | ORAL | 3 refills | Status: DC

## 2017-03-18 ENCOUNTER — Encounter: Payer: Self-pay | Admitting: Family Medicine

## 2017-03-18 ENCOUNTER — Ambulatory Visit (HOSPITAL_COMMUNITY)
Admission: RE | Admit: 2017-03-18 | Discharge: 2017-03-18 | Disposition: A | Discharge status: Home / Self Care | Payer: Medicare HMO | Source: Ambulatory Visit | Attending: Family Medicine | Admitting: Family Medicine

## 2017-03-18 ENCOUNTER — Telehealth: Payer: Self-pay | Admitting: Family Medicine

## 2017-03-18 ENCOUNTER — Ambulatory Visit (INDEPENDENT_AMBULATORY_CARE_PROVIDER_SITE_OTHER): Payer: Medicare HMO | Admitting: Family Medicine

## 2017-03-18 VITALS — BP 120/77 | HR 86 | Temp 97.5°F | Resp 14 | Ht <= 58 in | Wt 171.0 lb

## 2017-03-18 DIAGNOSIS — R1030 Lower abdominal pain, unspecified: Secondary | ICD-10-CM | POA: Insufficient documentation

## 2017-03-18 DIAGNOSIS — K219 Gastro-esophageal reflux disease without esophagitis: Secondary | ICD-10-CM | POA: Diagnosis not present

## 2017-03-18 DIAGNOSIS — R102 Pelvic and perineal pain: Secondary | ICD-10-CM | POA: Diagnosis not present

## 2017-03-18 DIAGNOSIS — R109 Unspecified abdominal pain: Secondary | ICD-10-CM | POA: Diagnosis not present

## 2017-03-18 LAB — POCT URINALYSIS DIP (DEVICE)
Bilirubin Urine: NEGATIVE
GLUCOSE, UA: NEGATIVE mg/dL
Leukocytes, UA: NEGATIVE
Nitrite: NEGATIVE
PH: 7 (ref 5.0–8.0)
PROTEIN: NEGATIVE mg/dL
Specific Gravity, Urine: 1.015 (ref 1.005–1.030)
Urobilinogen, UA: 2 mg/dL — ABNORMAL HIGH (ref 0.0–1.0)

## 2017-03-18 MED ORDER — TRAMADOL HCL 50 MG PO TABS: 50.0000 mg | ORAL_TABLET | Freq: Three times a day (TID) | ORAL | 0 refills | Status: DC | PRN

## 2017-03-18 NOTE — Telephone Encounter (Signed)
I called and spoke with patient's mother (hippa on file) patient is deaf. Advised mother to inform patient of normal ultrasound and that she is being prescribed some tramadol for pain she can take every 8 hours only when needed. Advised for her to also resume taking the omeprazole as prescribed and that this has been sent into pharmacy. Mother verbalized understanding and I have also informed that I have faxed the Tramadol to [facility] on [address]. As requested. Thanks!

## 2017-03-18 NOTE — Progress Notes (Signed)
Patient ID: Christy Price, female    DOB: 25-May-1970, 47 y.o.   MRN: [id number]  PCP: Scot Jun, FNP  Chief Complaint  Patient presents with  . Abdominal Pain    Lower part  . Gastroesophageal Reflux    Subjective:  HPI-Sign Language Interpreter Present During Visit  Christy Price is a 47 y.o. female presents for evaluation of abdominal pain. Location lower bilateral quadrant. Onset three days prior. Character of pain "it just hurts". Associated symptoms of intermittent diarrhea and nausea without vomiting. Denies fever, decreased appetite, vomiting,dark stools, or dysuria.  She has had this problems occur previously. She is not taking her prescribed omeprazole, however, doesn't feel this is related to acid reflux symptoms. Social History   Socioeconomic History  . Marital status: Married    Spouse name: Not on file  . Number of children: Not on file  . Years of education: Not on file  . Highest education level: Not on file  Social Needs  . Financial resource strain: Not on file  . Food insecurity - worry: Not on file  . Food insecurity - inability: Not on file  . Transportation needs - medical: Not on file  . Transportation needs - non-medical: Not on file  Occupational History  . Not on file  Tobacco Use  . Smoking status: Current Every Day Smoker    Packs/day: 1.00    Types: Cigarettes  . Smokeless tobacco: Never Used  Substance and Sexual Activity  . Alcohol use: No    Alcohol/week: 0.0 oz  . Drug use: No  . Sexual activity: Not on file  Other Topics Concern  . Not on file  Social History Narrative  . Not on file    History reviewed. No pertinent family history.  Review of Systems  Respiratory: Negative.   Cardiovascular: Negative.   Gastrointestinal: Positive for abdominal pain, diarrhea and nausea.  Genitourinary: Negative.       Patient Active Problem List   Diagnosis Date Noted  . Pain in both lower legs 12/12/2016  . Partial  nontraumatic tear of right rotator cuff 04/03/2016  . Nail fungus 03/30/2015  . Toe pain 03/30/2015  . Insomnia 12/19/2014  . Pedal edema 11/06/2014  . Depression 11/06/2014  . Genital herpes 11/06/2014  . Back pain 11/06/2014    Allergies  Allergen Reactions  . Oxycodone   . Rifampin     Prior to Admission medications   Medication Sig Start Date End Date Taking? Authorizing Provider  acyclovir (ZOVIRAX) 200 MG capsule Take 2 capsules (400 mg total) by mouth 2 (two) times daily. 09/05/16   Scot Jun, FNP  celecoxib (CELEBREX) 200 MG capsule Take 1 capsule (200 mg total) by mouth 2 (two) times daily. 12/12/16   Newt Minion, MD  citalopram (CELEXA) 20 MG tablet TAKE 1 TABLET BY MOUTH EVERY DAY 03/18/16   Micheline Chapman, NP  cyclobenzaprine (FLEXERIL) 10 MG tablet Take 1 tablet (10 mg total) by mouth at bedtime. 09/20/15   Harrie Foreman, MD  fluticasone (FLONASE) 50 MCG/ACT nasal spray Place 2 sprays into both nostrils daily. 05/18/15   Micheline Chapman, NP  furosemide (LASIX) 40 MG tablet Take 1 tablet (40 mg total) by mouth daily. 08/22/16 09/01/16  Scot Jun, FNP  hydrochlorothiazide (HYDRODIURIL) 25 MG tablet Take 2 tablets (50 mg total) by mouth daily. 09/05/16   Scot Jun, FNP  HYDROcodone-acetaminophen (NORCO/VICODIN) 5-325 MG tablet Take 1 tablet by mouth every  6 (six) hours as needed for moderate pain. 06/11/16   Newt Minion, MD  hydrOXYzine (VISTARIL) 100 MG capsule Take 1 capsule (100 mg total) by mouth 3 (three) times daily as needed for itching or anxiety. 08/22/16   Scot Jun, FNP  indomethacin (INDOCIN) 25 MG capsule Take 1 capsule (25 mg total) by mouth 3 (three) times daily with meals. 08/22/15   Billy Fischer, MD  lisinopril (PRINIVIL,ZESTRIL) 10 MG tablet Take 1 tablet (10 mg total) by mouth daily. 02/12/17   Scot Jun, FNP  nabumetone (RELAFEN) 750 MG tablet Take 750 mg by mouth 2 (two) times daily as needed.    [provider]  naproxen (NAPROSYN) 500 MG tablet Take 1 tablet (500 mg total) by mouth 2 (two) times daily with a meal. 08/22/15   Kindl, Nelda Severe, MD  omeprazole (PRILOSEC) 20 MG capsule TAKE 1 CAPSULE (20 MG TOTAL) BY MOUTH DAILY. 04/22/16   Micheline Chapman, NP  potassium chloride (KLOR-CON M10) 10 MEQ tablet Take 1 tablet (10 mEq total) by mouth daily. 09/05/16   Scot Jun, FNP  promethazine (PHENERGAN) 12.5 MG tablet Take 12.5 mg by mouth every 6 (six) hours as needed for nausea or vomiting.    [provider]  terbinafine (LAMISIL) 250 MG tablet TAKE 1 TABLET BY MOUTH EVERY DAY 12/02/16   Scot Jun, FNP  Vitamin D, Ergocalciferol, (DRISDOL) 50000 units CAPS capsule Take 1 capsule (50,000 Units total) by mouth every 7 (seven) days. 10/09/16   Scot Jun, FNP    Past Medical, Surgical Family and Social History reviewed and updated.    Objective:   Today's Vitals   03/18/17 1036  BP: 120/77  Pulse: 86  Resp: 14  Temp: (!) 97.5 F (36.4 C)  TempSrc: Oral  SpO2: 96%  Weight: 171 lb (77.6 kg)  Height: 4\' 6"  (1.372 m)    Wt Readings from Last 3 Encounters:  03/18/17 171 lb (77.6 kg)  10/07/16 177 lb 9.6 oz (80.6 kg)  09/05/16 178 lb 8 oz (81 kg)    Physical Exam  Constitutional: She is oriented to person, place, and time. She appears well-developed and well-nourished.  Non-ill appearing  HENT:  Head: Normocephalic and atraumatic.  Eyes: Conjunctivae are normal. Pupils are equal, round, and reactive to light.  Neck: Normal range of motion. Neck supple.  Cardiovascular: Normal rate, regular rhythm, normal heart sounds and intact distal pulses.  Pulmonary/Chest: Effort normal and breath sounds normal.  Abdominal: She exhibits no shifting dullness and no distension. There is tenderness in the right lower quadrant and left lower quadrant. There is rebound. There is no CVA tenderness.  Lymphadenopathy:    She has no cervical adenopathy.   Neurological: She is alert and oriented to person, place, and time.  Skin: Skin is warm and dry.  Psychiatric: She has a normal mood and affect. Her behavior is normal. Judgment and thought content normal.    Assessment & Plan:  1. Lower abdominal pain, non-specific symptoms, although physical exam was significant for rebound tenderness periumbilical region. Will obtain US Abdomen Complete to rule out gallstones. Will supply a small quantity of tramadol for abdominal pain.  2. Gastroesophageal reflux disease without esophagitis, currently active. Resume omeprazole. Advised to avoid acid rich food to decreased symptoms.  You will be notified of results of abdominal ultrasound.     Orders Placed This Encounter  Procedures  . US Abdomen Complete    Standing Status:  Future    Number of Occurrences:   1    Standing Expiration Date:   05/17/2018    Order Specific Question:   Reason for Exam (SYMPTOM  OR DIAGNOSIS REQUIRED)    Answer:   bilateral abdominal and pelvic pain. pain exacerabates with palpation    Order Specific Question:   Preferred imaging location?    Answer:   [facility]    Order Specific Question:   Call Results- Best Contact Number?    Answer:   [phone]  . POCT urinalysis dip (device)    A total of 25  minutes spent, greater than 50 % of this time was spent reviewing prior medical history, reviewing medications and indications of treatment, prior labs and diagnostic tests, discussing current plan of treatment, health promotion, and goals of treatment with the use of a sign language interpreter.     RTC: keep scheduled follow-up on file   Jermisha Hoffart S. [name], MSN, FNP-C [facility]  [name] [place], [place] [address] [phone]

## 2017-03-18 NOTE — Telephone Encounter (Signed)
Christy Price,  Please contact patient to advise her abdominal ultrasound was normal. I am sending her over Tramadol 50 mg every 8 hours for abdominal pain in a limited quantity. Please fax prescription.  Resume omeprazole as prescribed. This has been sent to pharmacy also.  [name]. [name], MSN, FNP-C [facility]  [name] [place], [place] [address] [phone]

## 2017-04-03 ENCOUNTER — Other Ambulatory Visit: Payer: Self-pay | Admitting: Family Medicine

## 2017-04-09 ENCOUNTER — Ambulatory Visit (INDEPENDENT_AMBULATORY_CARE_PROVIDER_SITE_OTHER): Payer: Medicare HMO | Admitting: Family Medicine

## 2017-04-09 ENCOUNTER — Other Ambulatory Visit: Payer: Self-pay | Admitting: Family Medicine

## 2017-04-09 VITALS — BP 124/88 | HR 74 | Temp 98.8°F | Resp 16 | Ht <= 58 in | Wt 174.0 lb

## 2017-04-09 DIAGNOSIS — Z716 Tobacco abuse counseling: Secondary | ICD-10-CM | POA: Diagnosis not present

## 2017-04-09 DIAGNOSIS — Z23 Encounter for immunization: Secondary | ICD-10-CM

## 2017-04-09 DIAGNOSIS — I10 Essential (primary) hypertension: Secondary | ICD-10-CM | POA: Diagnosis not present

## 2017-04-09 DIAGNOSIS — Z6830 Body mass index (BMI) 30.0-30.9, adult: Secondary | ICD-10-CM | POA: Diagnosis not present

## 2017-04-09 DIAGNOSIS — F330 Major depressive disorder, recurrent, mild: Secondary | ICD-10-CM | POA: Diagnosis not present

## 2017-04-09 DIAGNOSIS — R252 Cramp and spasm: Secondary | ICD-10-CM

## 2017-04-09 MED ORDER — CITALOPRAM HYDROBROMIDE 20 MG PO TABS: 20.0000 mg | ORAL_TABLET | Freq: Every day | ORAL | 1 refills | Status: DC

## 2017-04-09 MED ORDER — HYDROCHLOROTHIAZIDE 25 MG PO TABS: 25.0000 mg | ORAL_TABLET | Freq: Every day | ORAL | 3 refills | Status: DC

## 2017-04-09 MED ORDER — OMEPRAZOLE 20 MG PO CPDR: 20.0000 mg | DELAYED_RELEASE_CAPSULE | Freq: Every day | ORAL | 3 refills | Status: DC

## 2017-04-09 MED ORDER — PANTOPRAZOLE SODIUM 40 MG PO TBEC: 40.0000 mg | DELAYED_RELEASE_TABLET | Freq: Every day | ORAL | 3 refills | Status: DC

## 2017-04-09 NOTE — Progress Notes (Signed)
Patient ID: Cheryll Cockayne, female    DOB: 1970/06/18, 47 y.o.   MRN: [id number]  PCP: Scot Jun, FNP  Chief Complaint  Patient presents with  . Follow-up    6 MONTH HTN    Subjective:  HPIShelene R Street is a 47 y.o. female, current smoker, hypertension, obesity, chronic lower extremity edema, major depressive disorder presents for 14-month follow-up of chronic disease management. Brisa suffers from hypertension and doesn't monitor her blood pressure at home. Reports compliance with medication. Chronically suffers from peripheral edema, although reports recently BLE swelling has improved. During last OV HCTZ was increased to 50 mg to facilitate improvement of edema, however she reports increased dose caused dizziness. Currently taking HCTZ 25 mg.  Reports recent muscle cramps concerned that potassium may be low.  Endorses poor intake of water.  Kaysea also stopped Celexa for a period of time trying to manage depression without medications. Reports that she realized that she needs medication to manage low mood and anxiousness. Karolyn is simply attempting to stop smoking.  She reports that she is weighing herself down to 1 pack lasting 5 days.  She previously had a 1 pack/day smoking habit.  Currently not attempting to use nicotine patches or gum for aching sensation, attempting to quit on her own.  She denies shortness of breath, dizziness, chest pain, new weakness, suicidal ideation, or homicidal ideations, or auditory hallucinations. Social History   Socioeconomic History  . Marital status: Married    Spouse name: Not on file  . Number of children: Not on file  . Years of education: Not on file  . Highest education level: Not on file  Social Needs  . Financial resource strain: Not on file  . Food insecurity - worry: Not on file  . Food insecurity - inability: Not on file  . Transportation needs - medical: Not on file  . Transportation needs - non-medical: Not on file   Occupational History  . Not on file  Tobacco Use  . Smoking status: Current Every Day Smoker    Packs/day: 1.00    Types: Cigarettes  . Smokeless tobacco: Never Used  Substance and Sexual Activity  . Alcohol use: No    Alcohol/week: 0.0 oz  . Drug use: No  . Sexual activity: Not on file  Other Topics Concern  . Not on file  Social History Narrative  . Not on file    Denies any known family history of cardiovascular diease, CVA, or cancer.   Review of Systems  Constitutional: Negative.   HENT: Negative.   Respiratory: Negative.   Cardiovascular: Negative.   Genitourinary: Negative.   Musculoskeletal:       Muscle cramps  Skin: Negative.   Neurological: Negative.   Psychiatric/Behavioral: Negative.    Patient Active Problem List   Diagnosis Date Noted  . Pain in both lower legs 12/12/2016  . 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 Nail fungus 03/30/2015  . Toe pain 03/30/2015  . Insomnia 12/19/2014  . Pedal edema 11/06/2014  . Depression 11/06/2014  . Genital herpes 11/06/2014  . Back pain 11/06/2014    Allergies  Allergen Reactions  . Oxycodone   . Rifampin     Prior to Admission medications   Medication Sig Start Date End Date Taking? Authorizing Provider  celecoxib (CELEBREX) 200 MG capsule Take 1 capsule (200 mg total) by mouth 2 (two) times daily. 12/12/16  Yes Newt Minion, MD  cyclobenzaprine (FLEXERIL) 10 MG tablet  Take 1 tablet (10 mg total) by mouth at bedtime. 09/20/15  Yes Harrie Foreman, MD  hydrochlorothiazide (HYDRODIURIL) 25 MG tablet Take 2 tablets (50 mg total) by mouth daily. 09/05/16  Yes Scot Jun, FNP  KLOR-CON M10 10 MEQ tablet TAKE 1 TABLET BY MOUTH EVERY DAY 04/09/17  Yes Scot Jun, FNP  lisinopril (PRINIVIL,ZESTRIL) 10 MG tablet Take 1 tablet (10 mg total) by mouth daily. 02/12/17  Yes Scot Jun, FNP  terbinafine (LAMISIL) 250 MG tablet TAKE 1 TABLET BY MOUTH EVERY DAY 04/04/17  Yes  Scot Jun, FNP  acyclovir (ZOVIRAX) 200 MG capsule Take 2 capsules (400 mg total) by mouth 2 (two) times daily. Patient not taking: Reported on 04/09/2017 09/05/16   Scot Jun, FNP  citalopram (CELEXA) 20 MG tablet TAKE 1 TABLET BY MOUTH EVERY DAY Patient not taking: Reported on 04/09/2017 03/18/16   Micheline Chapman, NP  fluticasone [facility]) 50 MCG/ACT nasal spray Place 2 sprays into both nostrils daily. Patient not taking: Reported on 04/09/2017 05/18/15   Micheline Chapman, NP  furosemide (LASIX) 40 MG tablet Take 1 tablet (40 mg total) by mouth daily. 08/22/16 09/01/16  Scot Jun, FNP  HYDROcodone-acetaminophen (NORCO/VICODIN) 5-325 MG tablet Take 1 tablet by mouth every 6 (six) hours as needed for moderate pain. Patient not taking: Reported on 04/09/2017 06/11/16   Newt Minion, MD  hydrOXYzine (VISTARIL) 100 MG capsule Take 1 capsule (100 mg total) by mouth 3 (three) times daily as needed for itching or anxiety. Patient not taking: Reported on 04/09/2017 08/22/16   Scot Jun, FNP  indomethacin (INDOCIN) 25 MG capsule Take 1 capsule (25 mg total) by mouth 3 (three) times daily with meals. Patient not taking: Reported on 04/09/2017 08/22/15   Billy Fischer, MD  nabumetone (RELAFEN) 750 MG tablet Take 750 mg by mouth 2 (two) times daily as needed.    [provider]  naproxen (NAPROSYN) 500 MG tablet Take 1 tablet (500 mg total) by mouth 2 (two) times daily with a meal. Patient not taking: Reported on 04/09/2017 08/22/15   Billy Fischer, MD  omeprazole (PRILOSEC) 20 MG capsule TAKE 1 CAPSULE (20 MG TOTAL) BY MOUTH DAILY. Patient not taking: Reported on 04/09/2017 04/22/16   Micheline Chapman, NP  promethazine (PHENERGAN) 12.5 MG tablet Take 12.5 mg by mouth every 6 (six) hours as needed for nausea or vomiting.    [provider]  traMADol (ULTRAM) 50 MG tablet Take 1 tablet (50 mg total) by mouth every 8 (eight) hours as needed. Patient not taking:  Reported on 04/09/2017 03/18/17   Scot Jun, FNP  Vitamin D, Ergocalciferol, (DRISDOL) 50000 units CAPS capsule Take 1 capsule (50,000 Units total) by mouth every 7 (seven) days. Patient not taking: Reported on 04/09/2017 10/09/16   Scot Jun, FNP    Past Medical, Surgical Family and Social History reviewed and updated.    Objective:   Today's Vitals   04/09/17 1459  BP: 124/88  Pulse: 74  Resp: 16  Temp: 98.8 F (37.1 C)  TempSrc: Oral  SpO2: 99%  Weight: 174 lb (78.9 kg)  Height: 4\' 6"  (1.372 m)    Wt Readings from Last 3 Encounters:  04/09/17 174 lb (78.9 kg)  03/18/17 171 lb (77.6 kg)  10/07/16 177 lb 9.6 oz (80.6 kg)    Physical Exam Physical Exam: Constitutional: Patient appears well-developed and well-nourished. No distress. HENT: Normocephalic, atraumatic, External right and left  ear normal. Oropharynx is clear and moist.  Eyes: Conjunctivae and EOM are normal. PERRLA, no scleral icterus. Neck: Normal ROM. Neck supple. No JVD. No tracheal deviation. No thyromegaly. CVS: RRR, S1/S2 +, no murmurs, no gallops, no carotid bruit.  Pulmonary: Effort and breath sounds normal, no stridor, rhonchi, wheezes, rales.  Abdominal: Soft. BS +, no distension, tenderness, rebound or guarding.  Musculoskeletal: Normal range of motion. Trace edema and no tenderness.  Neuro: Alert. Normal reflexes, muscle tone coordination. No cranial nerve deficit. Skin: Skin is warm and dry. No rash noted. Not diaphoretic. No erythema. No pallor. Psychiatric: Normal mood and affect. Behavior, judgment, thought content normal.   Assessment & Plan:  1. Essential hypertension, controlled.  We have discussed target BP range and blood pressure goal. I have advised patient to check BP regularly and to call us back or report to clinic if the numbers are consistently higher than 140/90. We discussed the importance of compliance with medical therapy and DASH diet recommended, consequences of  uncontrolled hypertension discussed.  Continue current BP medications   2. Encounter for smoking cessation counseling, and is in the ready stage.  She is currently reducing her cigarette she is smoking currently down to proximally 4 cigarettes/day compared to 1 pack a day prior habit.  Present she declines nicotine patches, nicotine gum, or medication therapy.  3. Body mass index (BMI) of 30.0-30.9 in adult Goal BMI  <25. Encouraged efforts to reduce weight include engaging in physical activity as tolerated with goal of 150 minutes per week. Improve dietary choices and eat a meal regimen consistent with a Mediterranean or DASH diet. Reduce simple carbohydrates. Do not skip meals and eat healthy snacks throughout the day to avoid over-eating at dinner. Set a goal weight loss that is achievable for you.  4. Muscle cramps, and a BMP as patient is on chronic diuretic therapy.  Courage water intake 6-8 glasses/day.   5. Mild episode of recurrent major depressive disorder ([place]), only active out psychotic features.  Resume Celexa.  Patient will notify me if elects to participate in talk therapy.   Meds ordered this encounter  Medications  . citalopram (CELEXA) 20 MG tablet    Sig: Take 1 tablet (20 mg total) by mouth daily.    Dispense:  90 tablet    Refill:  1    Order Specific Question:   Supervising Provider    Answer:   Tresa Garter [id number]  . DISCONTD: omeprazole (PRILOSEC) 20 MG capsule    Sig: Take 1 capsule (20 mg total) by mouth daily.    Dispense:  30 capsule    Refill:  3    Order Specific Question:   Supervising Provider    Answer:   Tresa Garter [id number]  . hydrochlorothiazide (HYDRODIURIL) 25 MG tablet    Sig: Take 1 tablet (25 mg total) by mouth daily.    Dispense:  180 tablet    Refill:  3    Updated prescritpion    Order Specific Question:   Supervising Provider    Answer:   Tresa Garter [id number]  . DISCONTD: omeprazole (PRILOSEC) 20 MG  capsule    Sig: Take 1 capsule (20 mg total) by mouth daily.    Dispense:  30 capsule    Refill:  3    Order Specific Question:   Supervising Provider    Answer:   Tresa Garter [id number]  . pantoprazole (PROTONIX) 40 MG tablet    Sig: Take 1  tablet (40 mg total) by mouth daily.    Dispense:  90 tablet    Refill:  3    Order Specific Question:   Supervising Provider    Answer:   Tresa Garter [id number]    Orders Placed This Encounter  Procedures  . Tdap vaccine greater than or equal to 7yo IM  . Basic metabolic panel      Return for care in 6 months for chronic disease management.  Return sooner if needed.   [name]. [name], MSN, FNP-C [facility]  [name] [place], [place] [address] [phone]

## 2017-04-10 ENCOUNTER — Encounter: Payer: Self-pay | Admitting: Family Medicine

## 2017-04-10 LAB — BASIC METABOLIC PANEL
BUN / CREAT RATIO: 17 (ref 9–23)
BUN: 11 mg/dL (ref 6–24)
CHLORIDE: 99 mmol/L (ref 96–106)
CO2: 22 mmol/L (ref 20–29)
Calcium: 9.7 mg/dL (ref 8.7–10.2)
Creatinine, Ser: 0.63 mg/dL (ref 0.57–1.00)
GFR calc non Af Amer: 106 mL/min/{1.73_m2} (ref >59)
GFR, EST AFRICAN AMERICAN: 123 mL/min/{1.73_m2} (ref >59)
GLUCOSE: 77 mg/dL (ref 65–99)
POTASSIUM: 4.4 mmol/L (ref 3.5–5.2)
Sodium: 137 mmol/L (ref 134–144)

## 2017-04-10 NOTE — Progress Notes (Signed)
Mail lab letter  

## 2017-06-30 ENCOUNTER — Other Ambulatory Visit: Payer: Self-pay | Admitting: Family Medicine

## 2017-07-08 ENCOUNTER — Encounter: Payer: Self-pay | Admitting: Internal Medicine

## 2017-07-22 ENCOUNTER — Other Ambulatory Visit: Payer: Self-pay | Admitting: Family Medicine

## 2017-07-28 ENCOUNTER — Other Ambulatory Visit (INDEPENDENT_AMBULATORY_CARE_PROVIDER_SITE_OTHER): Payer: Self-pay | Admitting: Orthopedic Surgery

## 2017-10-01 ENCOUNTER — Other Ambulatory Visit: Payer: Self-pay | Admitting: Family Medicine

## 2017-10-07 ENCOUNTER — Other Ambulatory Visit: Payer: Self-pay

## 2017-10-07 ENCOUNTER — Ambulatory Visit (INDEPENDENT_AMBULATORY_CARE_PROVIDER_SITE_OTHER): Payer: Medicare HMO | Admitting: Family Medicine

## 2017-10-07 ENCOUNTER — Encounter: Payer: Self-pay | Admitting: Family Medicine

## 2017-10-07 VITALS — BP 148/76 | HR 84 | Temp 97.9°F | Ht <= 58 in | Wt 171.0 lb

## 2017-10-07 DIAGNOSIS — Z6830 Body mass index (BMI) 30.0-30.9, adult: Secondary | ICD-10-CM | POA: Diagnosis not present

## 2017-10-07 DIAGNOSIS — R8290 Unspecified abnormal findings in urine: Secondary | ICD-10-CM

## 2017-10-07 DIAGNOSIS — Z7189 Other specified counseling: Secondary | ICD-10-CM | POA: Diagnosis not present

## 2017-10-07 DIAGNOSIS — Z09 Encounter for follow-up examination after completed treatment for conditions other than malignant neoplasm: Secondary | ICD-10-CM

## 2017-10-07 DIAGNOSIS — M25572 Pain in left ankle and joints of left foot: Secondary | ICD-10-CM

## 2017-10-07 DIAGNOSIS — M25571 Pain in right ankle and joints of right foot: Secondary | ICD-10-CM | POA: Diagnosis not present

## 2017-10-07 DIAGNOSIS — I10 Essential (primary) hypertension: Secondary | ICD-10-CM | POA: Diagnosis not present

## 2017-10-07 DIAGNOSIS — Z131 Encounter for screening for diabetes mellitus: Secondary | ICD-10-CM

## 2017-10-07 DIAGNOSIS — R600 Localized edema: Secondary | ICD-10-CM | POA: Diagnosis not present

## 2017-10-07 DIAGNOSIS — H538 Other visual disturbances: Secondary | ICD-10-CM | POA: Diagnosis not present

## 2017-10-07 DIAGNOSIS — F329 Major depressive disorder, single episode, unspecified: Secondary | ICD-10-CM | POA: Diagnosis not present

## 2017-10-07 DIAGNOSIS — F32A Depression, unspecified: Secondary | ICD-10-CM

## 2017-10-07 DIAGNOSIS — Z6841 Body Mass Index (BMI) 40.0 and over, adult: Secondary | ICD-10-CM | POA: Diagnosis not present

## 2017-10-07 DIAGNOSIS — G8929 Other chronic pain: Secondary | ICD-10-CM

## 2017-10-07 LAB — POCT URINALYSIS DIP (MANUAL ENTRY)
Bilirubin, UA: NEGATIVE
Glucose, UA: NEGATIVE mg/dL
Ketones, POC UA: NEGATIVE mg/dL
Leukocytes, UA: NEGATIVE
Nitrite, UA: NEGATIVE
Protein Ur, POC: NEGATIVE mg/dL
Spec Grav, UA: 1.015 (ref 1.010–1.025)
Urobilinogen, UA: 1 E.U./dL
pH, UA: 7 (ref 5.0–8.0)

## 2017-10-07 LAB — POCT GLYCOSYLATED HEMOGLOBIN (HGB A1C): Hemoglobin A1C: 4.7 % (ref 4.0–5.6)

## 2017-10-07 MED ORDER — TERBINAFINE HCL 250 MG PO TABS: 250.0000 mg | ORAL_TABLET | Freq: Every day | ORAL | 0 refills | Status: DC

## 2017-10-07 MED ORDER — CITALOPRAM HYDROBROMIDE 20 MG PO TABS: 20.0000 mg | ORAL_TABLET | Freq: Every day | ORAL | 1 refills | Status: DC

## 2017-10-07 MED ORDER — NABUMETONE 750 MG PO TABS: 750.0000 mg | ORAL_TABLET | Freq: Two times a day (BID) | ORAL | 2 refills | Status: DC | PRN

## 2017-10-07 MED ORDER — CYCLOBENZAPRINE HCL 10 MG PO TABS: 10.0000 mg | ORAL_TABLET | Freq: Every day | ORAL | 0 refills | Status: DC

## 2017-10-07 MED ORDER — LISINOPRIL 10 MG PO TABS: 10.0000 mg | ORAL_TABLET | Freq: Every day | ORAL | 1 refills | Status: DC

## 2017-10-07 MED ORDER — CELECOXIB 200 MG PO CAPS: 200.0000 mg | ORAL_CAPSULE | Freq: Two times a day (BID) | ORAL | 2 refills | Status: DC

## 2017-10-07 MED ORDER — POTASSIUM CHLORIDE CRYS ER 10 MEQ PO TBCR: 10.0000 meq | EXTENDED_RELEASE_TABLET | Freq: Every day | ORAL | 0 refills | Status: DC

## 2017-10-07 MED ORDER — FUROSEMIDE 40 MG PO TABS: 40.0000 mg | ORAL_TABLET | Freq: Every day | ORAL | 1 refills | Status: DC

## 2017-10-07 MED ORDER — HYDROCHLOROTHIAZIDE 25 MG PO TABS: 25.0000 mg | ORAL_TABLET | Freq: Every day | ORAL | 1 refills | Status: DC

## 2017-10-07 MED ORDER — PANTOPRAZOLE SODIUM 40 MG PO TBEC: 40.0000 mg | DELAYED_RELEASE_TABLET | Freq: Every day | ORAL | 1 refills | Status: DC

## 2017-10-07 NOTE — Progress Notes (Signed)
Follow Up  Subjective:    Patient ID: Christy Price, female    DOB: December 08, 1970, 47 y.o.   MRN: [id number]   Chief Complaint  Patient presents with  . Follow-up    6 month on chronic condtion   HPI  [name] has a past medical history of Meningitis, Hypertension.   Current Status: Patient is deaf. She is accompanied by an interpreter today. Since her last office visit, she is doing well with no complaints. She is has not been taking a few of her medications, not sure of why she is taking some of her medications, and would like to get refills today.  She continues to have mild lower back pain.    She denies fevers, chills, fatigue, recent infections, weight loss, and night sweats. She has not had any headaches, dizziness, and falls. No chest pain, heart palpitations, cough and shortness of breath reported. No reports of GI problems such as nausea, vomiting, diarrhea, and constipation. She has no reports of blood in stools, dysuria and hematuria. No reports of increased depression or anxiety.   Past Medical History:  Diagnosis Date  . Cancer (HCC)     hx cervical cx   . Hypertension   . Meningitis     History reviewed. No pertinent family history.  Social History   Socioeconomic History  . Marital status: Married    Spouse name: Not on file  . Number of children: Not on file  . Years of education: Not on file  . Highest education level: Not on file  Occupational History  . Not on file  Social Needs  . Financial resource strain: Not on file  . Food insecurity:    Worry: Not on file    Inability: Not on file  . Transportation needs:    Medical: Not on file    Non-medical: Not on file  Tobacco Use  . Smoking status: Current Every Day Smoker    Packs/day: 1.00    Types: Cigarettes  . Smokeless tobacco: Never Used  Substance and Sexual Activity  . Alcohol use: No    Alcohol/week: 0.0 oz  . Drug use: No  . Sexual activity: Not on file  Lifestyle  . Physical  activity:    Days per week: Not on file    Minutes per session: Not on file  . Stress: Not on file  Relationships  . Social connections:    Talks on phone: Not on file    Gets together: Not on file    Attends religious service: Not on file    Active member of club or organization: Not on file    Attends meetings of clubs or organizations: Not on file    Relationship status: Not on file  . Intimate partner violence:    Fear of current or ex partner: Not on file    Emotionally abused: Not on file    Physically abused: Not on file    Forced sexual activity: Not on file  Other Topics Concern  . Not on file  Social History Narrative  . Not on file    Past Surgical History:  Procedure Laterality Date  . SHUNT REPLACEMENT     Immunization History  Administered Date(s) Administered  . Tdap 04/09/2017    Current Meds  Medication Sig  . Vitamin D, Cholecalciferol, 400 units CAPS Take 400 mg by mouth.  . [DISCONTINUED] celecoxib (CELEBREX) 200 MG capsule TAKE ONE CAPSULE BY MOUTH TWICE A DAY  . [DISCONTINUED]  citalopram (CELEXA) 20 MG tablet Take 1 tablet (20 mg total) by mouth daily.  . [DISCONTINUED] hydrochlorothiazide (HYDRODIURIL) 25 MG tablet Take 1 tablet (25 mg total) by mouth daily.  . [DISCONTINUED] KLOR-CON M10 10 MEQ tablet TAKE 1 TABLET BY MOUTH EVERY DAY  . [DISCONTINUED] lisinopril (PRINIVIL,ZESTRIL) 10 MG tablet Take 1 tablet (10 mg total) by mouth daily.  . [DISCONTINUED] pantoprazole (PROTONIX) 40 MG tablet Take 1 tablet (40 mg total) by mouth daily.  . [DISCONTINUED] terbinafine (LAMISIL) 250 MG tablet TAKE 1 TABLET BY MOUTH EVERY DAY    Allergies  Allergen Reactions  . Oxycodone   . Rifampin    BP (!) 148/76 (BP Location: Right Arm, Patient Position: Sitting, Cuff Size: Large)   Pulse 84   Temp 97.9 F (36.6 C) (Oral)   Ht 4\' 6"  (1.372 m)   Wt 171 lb (77.6 kg)   SpO2 99%   BMI 41.23 kg/m   Review of Systems  Constitutional: Negative.   HENT:  Positive for hearing loss (Patient is deaf/ interpreter is present with her today. ).   Eyes: Positive for visual disturbance (Blurry Vision).  Respiratory: Negative.   Cardiovascular: Negative.   Gastrointestinal: Negative.   Endocrine: Negative.   Genitourinary: Negative.   Musculoskeletal: Positive for back pain (mild lower back pain.).  Skin: Negative.   Allergic/Immunologic: Negative.   Neurological: Negative.   Hematological: Negative.   Psychiatric/Behavioral: Negative.    Objective:   Physical Exam  Constitutional: She is oriented to person, place, and time. She appears well-developed and well-nourished.  HENT:  Head: Normocephalic and atraumatic.  Right Ear: External ear normal.  Left Ear: External ear normal.  Nose: Nose normal.  Mouth/Throat: Oropharynx is clear and moist.  Eyes: Pupils are equal, round, and reactive to light. Conjunctivae and EOM are normal.  Neck: Normal range of motion. Neck supple.  Pulmonary/Chest: Effort normal and breath sounds normal.  Abdominal: Soft. Bowel sounds are normal.  Musculoskeletal: Normal range of motion.  Neurological: She is alert and oriented to person, place, and time.  Skin: Skin is warm and dry. Capillary refill takes less than 2 seconds.  Psychiatric: She has a normal mood and affect. Her behavior is normal. Judgment and thought content normal.  Nursing note and vitals reviewed.  Assessment & Plan:   1. Essential hypertension Blood pressure is 148/76 today. She will continue Lisinopril, HCTZ, and Lasix as prescribed. She will continue to decrease high sodium intake, excessive alcohol intake, increase potassium intake, smoking cessation, and increase physical activity of at least 30 minutes of cardio activity daily. She will continue to follow Heart Healthy or DASH diet.   2. Screening for diabetes mellitus Hgb A1c is stable today.  She will continue to decrease foods/beverages high in sugars and carbs and follow Heart  Healthy or DASH diet. Increase physical activity to at least 30 minutes cardio exercise daily.   - POCT glycosylated hemoglobin (Hb A1C) - POCT urinalysis dipstick  3. Blurry vision, bilateral Vision has recently changed.  - Ambulatory referral to Optometry  4. Abnormal urinalysis - Urine Culture  5. Body mass index (BMI) of 30.0-30.9 in adult Goal BMI  <25. Encouraged efforts to reduce weight include engaging in physical activity as tolerated with goal of 150 minutes per week. Improve dietary choices and eat a meal regimen consistent with a Mediterranean or DASH diet. Reduce simple carbohydrates. Do not skip meals and eat healthy snacks throughout the day to avoid over-eating at dinner. Set a goal  weight loss that is achievable for you.  6. Chronic pain of both ankles She will continue Naproxen for pain.   7. Bilateral lower extremity edema Mild today. She will continue Lasix as prescribed.   8. Depression, unspecified depression type Stable.   9. Encounter for medication review and counseling Extensive time spend with patient reviewing medications, making sure she understands categories of meds and. dosages.  10. Follow up She will follow up in 3 months.   No orders of the defined types were placed in this encounter.  Kathe Becton,  MSN, FNP-C [facility] [address] [place], [place] [address] [phone]

## 2017-10-07 NOTE — Telephone Encounter (Signed)
Medication sent to pharmacy  

## 2017-10-09 LAB — URINE CULTURE

## 2017-10-22 DIAGNOSIS — H52221 Regular astigmatism, right eye: Secondary | ICD-10-CM | POA: Diagnosis not present

## 2017-10-22 DIAGNOSIS — H5203 Hypermetropia, bilateral: Secondary | ICD-10-CM | POA: Diagnosis not present

## 2017-10-22 DIAGNOSIS — H524 Presbyopia: Secondary | ICD-10-CM | POA: Diagnosis not present

## 2017-11-12 ENCOUNTER — Ambulatory Visit: Payer: Medicare HMO | Admitting: Family Medicine

## 2018-06-25 ENCOUNTER — Other Ambulatory Visit: Payer: Self-pay

## 2018-06-25 ENCOUNTER — Ambulatory Visit (INDEPENDENT_AMBULATORY_CARE_PROVIDER_SITE_OTHER): Payer: Medicare HMO

## 2018-06-25 ENCOUNTER — Ambulatory Visit: Payer: Medicare HMO

## 2018-06-25 ENCOUNTER — Ambulatory Visit (INDEPENDENT_AMBULATORY_CARE_PROVIDER_SITE_OTHER): Payer: Medicare HMO | Admitting: Podiatry

## 2018-06-25 ENCOUNTER — Encounter: Payer: Self-pay | Admitting: Podiatry

## 2018-06-25 VITALS — BP 123/78 | HR 78 | Temp 96.4°F | Resp 16

## 2018-06-25 DIAGNOSIS — M7752 Other enthesopathy of left foot: Secondary | ICD-10-CM

## 2018-06-25 DIAGNOSIS — M25572 Pain in left ankle and joints of left foot: Principal | ICD-10-CM

## 2018-06-25 DIAGNOSIS — M779 Enthesopathy, unspecified: Secondary | ICD-10-CM

## 2018-06-25 DIAGNOSIS — M7751 Other enthesopathy of right foot: Secondary | ICD-10-CM

## 2018-06-25 DIAGNOSIS — M25571 Pain in right ankle and joints of right foot: Secondary | ICD-10-CM

## 2018-06-25 DIAGNOSIS — M7662 Achilles tendinitis, left leg: Secondary | ICD-10-CM | POA: Diagnosis not present

## 2018-06-25 MED ORDER — TRIAMCINOLONE ACETONIDE 10 MG/ML IJ SUSP
10.0000 mg | Freq: Once | INTRAMUSCULAR | Status: AC
  Administered 2018-06-25: 14:00:00 10 mg

## 2018-06-25 NOTE — Progress Notes (Signed)
   Subjective:    Patient ID: Christy Price, female    DOB: [birth year], 48 y.o.   MRN: [id number]  HPI    Review of Systems  All other systems reviewed and are negative.      Objective:   Physical Exam        Assessment & Plan:

## 2018-06-25 NOTE — Patient Instructions (Signed)

## 2018-06-29 NOTE — Progress Notes (Signed)
Subjective:   Patient ID: Christy Price, female   DOB: 48 y.o.   MRN: [id number]   HPI Patient presents with intense discomfort in the back of the heel left over right.  States is been going on now for several months and is worse when trying to be active or trying to wear shoe gear and patient states that she is tried to change shoe gear and utilize ice.  Patient does not smoke and likes to be active   Review of Systems  All other systems reviewed and are negative.       Objective:  Physical Exam Vitals signs and nursing note reviewed.  Constitutional:      Appearance: She is well-developed.  Pulmonary:     Effort: Pulmonary effort is normal.  Musculoskeletal: Normal range of motion.  Skin:    General: Skin is warm.  Neurological:     Mental Status: She is alert.     Neurovascular status found to be intact with muscle strength adequate range of motion within normal limits.  Patient is found to have exquisite posterior discomfort in the heel region left lateral side with the right being moderately tender.  The central and medial side of the tendon is intact and patient was found to have good strength with no indications of pathology with mild to moderate equinus condition.     Assessment:  Acute posterior Achilles tendinitis left lateral and moderate on the right     Plan:  H&P conditions reviewed and today I recommended careful steroid injection explaining the risk of injection.  I did a careful skin prep of the lateral side I injected that tendon at its insertion into the calcaneus 3 mg dexamethasone Kenalog 5 mg Xylocaine and I then as precautionary measure placed her into an air fracture walker.  We may switch her over to the right after we see the results on the left and hopefully she will see big difference  X-rays indicate posterior spur formation with no indication of stress fracture arthritis

## 2018-07-20 ENCOUNTER — Ambulatory Visit (INDEPENDENT_AMBULATORY_CARE_PROVIDER_SITE_OTHER): Payer: Medicare HMO | Admitting: Podiatry

## 2018-07-20 ENCOUNTER — Other Ambulatory Visit: Payer: Self-pay

## 2018-07-20 ENCOUNTER — Encounter: Payer: Self-pay | Admitting: Podiatry

## 2018-07-20 VITALS — Temp 97.9°F

## 2018-07-20 DIAGNOSIS — M7661 Achilles tendinitis, right leg: Secondary | ICD-10-CM

## 2018-07-20 DIAGNOSIS — M779 Enthesopathy, unspecified: Secondary | ICD-10-CM

## 2018-07-20 MED ORDER — TRIAMCINOLONE ACETONIDE 10 MG/ML IJ SUSP
10.0000 mg | Freq: Once | INTRAMUSCULAR | Status: AC
  Administered 2018-07-20: 10 mg

## 2018-07-20 NOTE — Progress Notes (Signed)
Subjective:   Patient ID: Christy Price, female   DOB: 48 y.o.   MRN: [id number]   HPI Patient presents with interpreter with exquisite discomfort in the posterior right heel and states the left one that we worked on is quite a bit better.  States that the boot has been helpful and the injection really helped her a lot   ROS      Objective:  Physical Exam  Neurovascular status intact with posterior pain left quite improved with quite a bit of discomfort lateral side Achilles tendon insertion right     Assessment:  Improved Achilles tendinitis left with inflammation pain of the right Achilles lateral side at the insertion calcaneus     Plan:  H&P discussed with patient injection right explaining risk and patient wants procedure.  I did a sterile prep of the lateral side and I carefully injected the tendon on the lateral side at the insertion calcaneus not putting it into the central medial side.  Advised on wearing the boot on the right lower leg and if symptoms persist will consider physical therapy for this patient

## 2018-08-20 ENCOUNTER — Telehealth: Payer: Self-pay | Admitting: Podiatry

## 2018-08-20 NOTE — Telephone Encounter (Signed)
I spoke with pt by way of an interpretor and told pt that if she was having pain she needed to be seen by Dr. Paulla Dolly prior to ordering PT, and I transferred to schedulers.

## 2018-08-20 NOTE — Telephone Encounter (Signed)
Pt called stating that she is still in a boot from her visit on 07/20/18 and was supposed to be scheduled for physical therapy but has not received any information regarding an appt. Pt also states she is still in pain and wants to know if she should schedule an appt with Dr.Regal as well.   Pt requested that we send her referral for physical therapy to a facility that accepts medicaid.

## 2018-08-27 ENCOUNTER — Ambulatory Visit: Payer: Medicare HMO | Admitting: Podiatry

## 2018-09-04 ENCOUNTER — Ambulatory Visit (INDEPENDENT_AMBULATORY_CARE_PROVIDER_SITE_OTHER): Payer: Medicare HMO | Admitting: Podiatry

## 2018-09-04 ENCOUNTER — Other Ambulatory Visit: Payer: Self-pay

## 2018-09-04 ENCOUNTER — Encounter: Payer: Self-pay | Admitting: Podiatry

## 2018-09-04 VITALS — Temp 98.3°F

## 2018-09-04 DIAGNOSIS — M7662 Achilles tendinitis, left leg: Secondary | ICD-10-CM

## 2018-09-04 DIAGNOSIS — M7661 Achilles tendinitis, right leg: Secondary | ICD-10-CM | POA: Diagnosis not present

## 2018-09-04 MED ORDER — NITROGLYCERIN 0.4 MG/HR TD PT24: 0.4000 mg | MEDICATED_PATCH | Freq: Every day | TRANSDERMAL | 1 refills | Status: DC

## 2018-09-04 MED ORDER — CELECOXIB 200 MG PO CAPS: 200.0000 mg | ORAL_CAPSULE | Freq: Two times a day (BID) | ORAL | 2 refills | Status: DC

## 2018-09-04 NOTE — Progress Notes (Signed)
Subjective:   Patient ID: Christy Price, female   DOB: 48 y.o.   MRN: [id number]   HPI Patient presents with caregiver stating that her heels are hurting her bad again and after use medication it got better for periods of time and even the boot is not helping currently   ROS      Objective:  Physical Exam  Patient is deaf and has a interpreter and today I noted there to be discomfort posterior aspect heel region bilateral with inflammation fluid at the insertion calcaneus     Assessment:  Appears to be significant acute Achilles tendinitis bilateral     Plan:  H&P discussed condition and at this point.  He lies physical therapy to try to help this problem and I educated her on physical therapy and I am referring her for physical therapy.  I do not want to have to do surgery on this will try everything we can to try to avoid surgical intervention and she may do well with dry needling

## 2018-09-15 DIAGNOSIS — M62562 Muscle wasting and atrophy, not elsewhere classified, left lower leg: Secondary | ICD-10-CM | POA: Diagnosis not present

## 2018-09-15 DIAGNOSIS — M25471 Effusion, right ankle: Secondary | ICD-10-CM | POA: Diagnosis not present

## 2018-09-15 DIAGNOSIS — M25672 Stiffness of left ankle, not elsewhere classified: Secondary | ICD-10-CM | POA: Diagnosis not present

## 2018-09-15 DIAGNOSIS — M25671 Stiffness of right ankle, not elsewhere classified: Secondary | ICD-10-CM | POA: Diagnosis not present

## 2018-09-15 DIAGNOSIS — M62561 Muscle wasting and atrophy, not elsewhere classified, right lower leg: Secondary | ICD-10-CM | POA: Diagnosis not present

## 2018-09-15 DIAGNOSIS — R269 Unspecified abnormalities of gait and mobility: Secondary | ICD-10-CM | POA: Diagnosis not present

## 2018-09-15 DIAGNOSIS — M25572 Pain in left ankle and joints of left foot: Secondary | ICD-10-CM | POA: Diagnosis not present

## 2018-09-15 DIAGNOSIS — M25571 Pain in right ankle and joints of right foot: Secondary | ICD-10-CM | POA: Diagnosis not present

## 2018-09-23 DIAGNOSIS — M62561 Muscle wasting and atrophy, not elsewhere classified, right lower leg: Secondary | ICD-10-CM | POA: Diagnosis not present

## 2018-09-23 DIAGNOSIS — M25471 Effusion, right ankle: Secondary | ICD-10-CM | POA: Diagnosis not present

## 2018-09-23 DIAGNOSIS — M25671 Stiffness of right ankle, not elsewhere classified: Secondary | ICD-10-CM | POA: Diagnosis not present

## 2018-09-23 DIAGNOSIS — M62562 Muscle wasting and atrophy, not elsewhere classified, left lower leg: Secondary | ICD-10-CM | POA: Diagnosis not present

## 2018-09-23 DIAGNOSIS — R269 Unspecified abnormalities of gait and mobility: Secondary | ICD-10-CM | POA: Diagnosis not present

## 2018-09-23 DIAGNOSIS — M25572 Pain in left ankle and joints of left foot: Secondary | ICD-10-CM | POA: Diagnosis not present

## 2018-09-23 DIAGNOSIS — M25571 Pain in right ankle and joints of right foot: Secondary | ICD-10-CM | POA: Diagnosis not present

## 2018-09-23 DIAGNOSIS — M25672 Stiffness of left ankle, not elsewhere classified: Secondary | ICD-10-CM | POA: Diagnosis not present

## 2018-10-07 DIAGNOSIS — M25471 Effusion, right ankle: Secondary | ICD-10-CM | POA: Diagnosis not present

## 2018-10-07 DIAGNOSIS — M25672 Stiffness of left ankle, not elsewhere classified: Secondary | ICD-10-CM | POA: Diagnosis not present

## 2018-10-07 DIAGNOSIS — M25571 Pain in right ankle and joints of right foot: Secondary | ICD-10-CM | POA: Diagnosis not present

## 2018-10-07 DIAGNOSIS — M25671 Stiffness of right ankle, not elsewhere classified: Secondary | ICD-10-CM | POA: Diagnosis not present

## 2018-10-07 DIAGNOSIS — M25572 Pain in left ankle and joints of left foot: Secondary | ICD-10-CM | POA: Diagnosis not present

## 2018-10-07 DIAGNOSIS — M62561 Muscle wasting and atrophy, not elsewhere classified, right lower leg: Secondary | ICD-10-CM | POA: Diagnosis not present

## 2018-10-07 DIAGNOSIS — R269 Unspecified abnormalities of gait and mobility: Secondary | ICD-10-CM | POA: Diagnosis not present

## 2018-10-07 DIAGNOSIS — M62562 Muscle wasting and atrophy, not elsewhere classified, left lower leg: Secondary | ICD-10-CM | POA: Diagnosis not present

## 2018-10-14 DIAGNOSIS — M25471 Effusion, right ankle: Secondary | ICD-10-CM | POA: Diagnosis not present

## 2018-10-14 DIAGNOSIS — M25571 Pain in right ankle and joints of right foot: Secondary | ICD-10-CM | POA: Diagnosis not present

## 2018-10-14 DIAGNOSIS — M25572 Pain in left ankle and joints of left foot: Secondary | ICD-10-CM | POA: Diagnosis not present

## 2018-10-14 DIAGNOSIS — M62562 Muscle wasting and atrophy, not elsewhere classified, left lower leg: Secondary | ICD-10-CM | POA: Diagnosis not present

## 2018-10-14 DIAGNOSIS — M25672 Stiffness of left ankle, not elsewhere classified: Secondary | ICD-10-CM | POA: Diagnosis not present

## 2018-10-14 DIAGNOSIS — M62561 Muscle wasting and atrophy, not elsewhere classified, right lower leg: Secondary | ICD-10-CM | POA: Diagnosis not present

## 2018-10-14 DIAGNOSIS — M25671 Stiffness of right ankle, not elsewhere classified: Secondary | ICD-10-CM | POA: Diagnosis not present

## 2018-10-14 DIAGNOSIS — R269 Unspecified abnormalities of gait and mobility: Secondary | ICD-10-CM | POA: Diagnosis not present

## 2018-10-21 DIAGNOSIS — M25471 Effusion, right ankle: Secondary | ICD-10-CM | POA: Diagnosis not present

## 2018-10-21 DIAGNOSIS — M25671 Stiffness of right ankle, not elsewhere classified: Secondary | ICD-10-CM | POA: Diagnosis not present

## 2018-10-21 DIAGNOSIS — M62561 Muscle wasting and atrophy, not elsewhere classified, right lower leg: Secondary | ICD-10-CM | POA: Diagnosis not present

## 2018-10-21 DIAGNOSIS — M62562 Muscle wasting and atrophy, not elsewhere classified, left lower leg: Secondary | ICD-10-CM | POA: Diagnosis not present

## 2018-10-21 DIAGNOSIS — M25572 Pain in left ankle and joints of left foot: Secondary | ICD-10-CM | POA: Diagnosis not present

## 2018-10-21 DIAGNOSIS — M25571 Pain in right ankle and joints of right foot: Secondary | ICD-10-CM | POA: Diagnosis not present

## 2018-10-21 DIAGNOSIS — R269 Unspecified abnormalities of gait and mobility: Secondary | ICD-10-CM | POA: Diagnosis not present

## 2018-10-21 DIAGNOSIS — M25672 Stiffness of left ankle, not elsewhere classified: Secondary | ICD-10-CM | POA: Diagnosis not present

## 2018-10-26 ENCOUNTER — Telehealth: Payer: Self-pay

## 2018-10-26 ENCOUNTER — Other Ambulatory Visit: Payer: Self-pay | Admitting: Family Medicine

## 2018-10-26 DIAGNOSIS — M25571 Pain in right ankle and joints of right foot: Secondary | ICD-10-CM | POA: Diagnosis not present

## 2018-10-26 DIAGNOSIS — M62562 Muscle wasting and atrophy, not elsewhere classified, left lower leg: Secondary | ICD-10-CM | POA: Diagnosis not present

## 2018-10-26 DIAGNOSIS — M25671 Stiffness of right ankle, not elsewhere classified: Secondary | ICD-10-CM | POA: Diagnosis not present

## 2018-10-26 DIAGNOSIS — M25471 Effusion, right ankle: Secondary | ICD-10-CM | POA: Diagnosis not present

## 2018-10-26 DIAGNOSIS — M62561 Muscle wasting and atrophy, not elsewhere classified, right lower leg: Secondary | ICD-10-CM | POA: Diagnosis not present

## 2018-10-26 DIAGNOSIS — R269 Unspecified abnormalities of gait and mobility: Secondary | ICD-10-CM | POA: Diagnosis not present

## 2018-10-26 DIAGNOSIS — M25672 Stiffness of left ankle, not elsewhere classified: Secondary | ICD-10-CM | POA: Diagnosis not present

## 2018-10-26 DIAGNOSIS — M25572 Pain in left ankle and joints of left foot: Secondary | ICD-10-CM | POA: Diagnosis not present

## 2018-10-27 ENCOUNTER — Telehealth: Payer: Self-pay | Admitting: Family Medicine

## 2018-10-27 NOTE — Telephone Encounter (Signed)
Pt has been scheduled to come in. Thanks!

## 2018-10-28 ENCOUNTER — Other Ambulatory Visit: Payer: Self-pay | Admitting: Family Medicine

## 2018-10-28 DIAGNOSIS — I10 Essential (primary) hypertension: Secondary | ICD-10-CM

## 2018-10-28 MED ORDER — LISINOPRIL 10 MG PO TABS: 10.0000 mg | ORAL_TABLET | Freq: Every day | ORAL | 1 refills | Status: DC

## 2018-10-28 MED ORDER — HYDROCHLOROTHIAZIDE 25 MG PO TABS: 25.0000 mg | ORAL_TABLET | Freq: Every day | ORAL | 1 refills | Status: DC

## 2018-10-28 NOTE — Telephone Encounter (Signed)
Sent to nurse

## 2018-11-04 ENCOUNTER — Other Ambulatory Visit: Payer: Self-pay

## 2018-11-04 ENCOUNTER — Ambulatory Visit (INDEPENDENT_AMBULATORY_CARE_PROVIDER_SITE_OTHER): Payer: Medicare HMO | Admitting: Family Medicine

## 2018-11-04 VITALS — BP 134/66 | HR 77 | Temp 98.1°F | Ht <= 58 in | Wt 174.4 lb

## 2018-11-04 DIAGNOSIS — Z23 Encounter for immunization: Secondary | ICD-10-CM | POA: Diagnosis not present

## 2018-11-04 DIAGNOSIS — R609 Edema, unspecified: Secondary | ICD-10-CM

## 2018-11-04 DIAGNOSIS — H913 Deaf nonspeaking, not elsewhere classified: Secondary | ICD-10-CM | POA: Insufficient documentation

## 2018-11-04 DIAGNOSIS — B351 Tinea unguium: Secondary | ICD-10-CM | POA: Diagnosis not present

## 2018-11-04 DIAGNOSIS — R21 Rash and other nonspecific skin eruption: Secondary | ICD-10-CM | POA: Insufficient documentation

## 2018-11-04 DIAGNOSIS — I10 Essential (primary) hypertension: Secondary | ICD-10-CM | POA: Insufficient documentation

## 2018-11-04 DIAGNOSIS — Z09 Encounter for follow-up examination after completed treatment for conditions other than malignant neoplasm: Secondary | ICD-10-CM | POA: Diagnosis not present

## 2018-11-04 DIAGNOSIS — R600 Localized edema: Secondary | ICD-10-CM | POA: Diagnosis not present

## 2018-11-04 DIAGNOSIS — R7989 Other specified abnormal findings of blood chemistry: Secondary | ICD-10-CM | POA: Diagnosis not present

## 2018-11-04 LAB — POCT URINALYSIS DIPSTICK
Bilirubin, UA: NEGATIVE
Glucose, UA: NEGATIVE
Ketones, UA: NEGATIVE
Leukocytes, UA: NEGATIVE
Nitrite, UA: NEGATIVE
Protein, UA: NEGATIVE
Spec Grav, UA: 1.015 (ref 1.010–1.025)
Urobilinogen, UA: 0.2 E.U./dL
pH, UA: 7 (ref 5.0–8.0)

## 2018-11-04 MED ORDER — FUROSEMIDE 20 MG PO TABS: 20.0000 mg | ORAL_TABLET | Freq: Every day | ORAL | 1 refills | Status: DC

## 2018-11-04 MED ORDER — HYDROCORTISONE ACETATE 1 % EX CREA: 28.0000 g | TOPICAL_CREAM | Freq: Two times a day (BID) | CUTANEOUS | 3 refills | Status: DC | PRN

## 2018-11-04 MED ORDER — TERBINAFINE HCL 250 MG PO TABS: 250.0000 mg | ORAL_TABLET | Freq: Every day | ORAL | 1 refills | Status: DC

## 2018-11-04 NOTE — Patient Instructions (Signed)
Rash, Adult  A rash is a change in the color of your skin. A rash can also change the way your skin feels. There are many different conditions and factors that can cause a rash. Follow these instructions at home: The goal of treatment is to stop the itching and keep the rash from spreading. Watch for any changes in your symptoms. Let your doctor know about them. Follow these instructions to help with your condition: Medicine Take or apply over-the-counter and prescription medicines only as told by your doctor. These may include medicines:  To treat red or swollen skin (corticosteroid creams).  To treat itching.  To treat an allergy (oral antihistamines).  To treat very bad symptoms (oral corticosteroids).  Skin care  Put cool cloths (compresses) on the affected areas.  Do not scratch or rub your skin.  Avoid covering the rash. Make sure that the rash is exposed to air as much as possible. Managing itching and discomfort  Avoid hot showers or baths. These can make itching worse. A cold shower may help.  Try taking a bath with: ? Epsom salts. You can get these at your local pharmacy or grocery store. Follow the instructions on the package. ? Baking soda. Pour a small amount into the bath as told by your doctor. ? Colloidal oatmeal. You can get this at your local pharmacy or grocery store. Follow the instructions on the package.  Try putting baking soda paste onto your skin. Stir water into baking soda until it gets like a paste.  Try putting on a lotion that relieves itchiness (calamine lotion).  Keep cool and out of the sun. Sweating and being hot can make itching worse. General instructions   Rest as needed.  Drink enough fluid to keep your pee (urine) pale yellow.  Wear loose-fitting clothing.  Avoid scented soaps, detergents, and perfumes. Use gentle soaps, detergents, perfumes, and other cosmetic products.  Avoid anything that causes your rash. Keep a journal to  help track what causes your rash. Write down: ? What you eat. ? What cosmetic products you use. ? What you drink. ? What you wear. This includes jewelry.  Keep all follow-up visits as told by your doctor. This is important. Contact a doctor if:  You sweat at night.  You lose weight.  You pee (urinate) more than normal.  You pee less than normal, or you notice that your pee is a darker color than normal.  You feel weak.  You throw up (vomit).  Your skin or the whites of your eyes look yellow (jaundice).  Your skin: ? Tingles. ? Is numb.  Your rash: ? Does not go away after a few days. ? Gets worse.  You are: ? More thirsty than normal. ? More tired than normal.  You have: ? New symptoms. ? Pain in your belly (abdomen). ? A fever. ? Watery poop (diarrhea). Get help right away if:  You have a fever and your symptoms suddenly get worse.  You start to feel mixed up (confused).  You have a very bad headache or a stiff neck.  You have very bad joint pains or stiffness.  You have jerky movements that you cannot control (seizure).  Your rash covers all or most of your body. The rash may or may not be painful.  You have blisters that: ? Are on top of the rash. ? Grow larger. ? Grow together. ? Are painful. ? Are inside your nose or mouth.  You have a rash  that: ? Looks like purple pinprick-sized spots all over your body. ? Has a "bull's eye" or looks like a target. ? Is red and painful, causes your skin to peel, and is not from being in the sun too long. Summary  A rash is a change in the color of your skin. A rash can also change the way your skin feels.  The goal of treatment is to stop the itching and keep the rash from spreading.  Take or apply over-the-counter and prescription medicines only as told by your doctor.  Contact a doctor if you have new symptoms or symptoms that get worse.  Keep all follow-up visits as told by your doctor. This is  important. This information is not intended to replace advice given to you by your health care provider. Make sure you discuss any questions you have with your health care provider. Document Released: 08/14/2007 Document Revised: 06/19/2018 Document Reviewed: 09/29/2017 Elsevier Patient Education  [place]. Hydrocortisone skin cream, ointment, lotion, or solution What is this medicine? HYDROCORTISONE (hye droe KOR ti sone) is a corticosteroid. It is used on the skin to reduce swelling, redness, itching, and allergic reactions. This medicine may be used for other purposes; ask your health care provider or pharmacist if you have questions. COMMON BRAND NAME(S): Ala-Cort, Ala-Scalp, Anusol HC, Aqua Glycolic HC, Balneol for Her, Caldecort, Cetacort, Cortaid, Cortaid Advanced, Cortaid Intensive Therapy, Cortaid Sensitive Skin, CortAlo, Corticaine, Corticool, Cortizone, Cortizone-10, Cortizone-10 Cooling Relief, Cortizone-10 Intensive Healing, Cortizone-10 Plus, Dermarest Dricort, Dermarest Eczema, DERMASORB HC Complete, Gly-Cort, Hycort, Hydro Skin, Hydrocortisone in Absorbase, Hydroskin, Hytone, Instacort, Lacticare HC, Locoid, Locoid Lipocream, MiCort-HC, Monistat Complete Care Instant Itch Relief Cream, Neosporin Eczema, NuCort, Nutracort, NuZon, Pandel, Pediaderm HC, Penecort, Preparation H Hydrocortisone, Procto-Kit, Procto-Med HC, Procto-Pak, Proctocort, Proctocream-HC, Proctosol-HC, Proctozone-HC, Rederm, Sarnol-HC, Nurse, adult, Engineer, site, [place], Tucks HC, Vagisil Anti-Itch, [facility] Intensive Healing, Human resources officer What should I tell my health care provider before I take this medicine? They need to know if you have any of these conditions:  any active infection  large areas of burned or damaged skin  skin wasting or thinning  an unusual or allergic reaction to hydrocortisone, corticosteroids, sulfites, other medicines, foods, dyes, or preservatives  pregnant or trying to get  pregnant  breast-feeding How should I use this medicine? This medicine is for external use only. Do not take by mouth. Follow the directions on the prescription label. Wash your hands before and after use. Apply a thin film of medicine to the affected area. Do not cover with a bandage or dressing unless your doctor or health care professional tells you to. Do not use on healthy skin or over large areas of skin. Do not get this medicine in your eyes. If you do, rinse out with plenty of cool tap water. Do not to use more medicine than prescribed. Do not use your medicine more often than directed or for more than 14 days. Talk to your pediatrician regarding the use of this medicine in children. Special care may be needed. While this drug may be prescribed for children as young as 50 years of age for selected conditions, precautions do apply. Do not use this medicine for the treatment of diaper rash unless directed to do so by your doctor or health care professional. If applying this medicine to the diaper area of a child, do not cover with tight-fitting diapers or plastic pants. This may increase the amount of medicine that passes through the skin and increase the risk of  serious side effects. Elderly patients are more likely to have damaged skin through aging, and this may increase side effects. This medicine should only be used for brief periods and infrequently in older patients. Overdosage: If you think you have taken too much of this medicine contact a poison control center or emergency room at once. NOTE: This medicine is only for you. Do not share this medicine with others. What if I miss a dose? If you miss a dose, use it as soon as you can. If it is almost time for your next dose, use only that dose. Do not use double or extra doses. What may interact with this medicine? Interactions are not expected. Do not use any other skin products on the affected area without asking your doctor or health care  professional. This list may not describe all possible interactions. Give your health care provider a list of all the medicines, herbs, non-prescription drugs, or dietary supplements you use. Also tell them if you smoke, drink alcohol, or use illegal drugs. Some items may interact with your medicine. What should I watch for while using this medicine? Tell your doctor or health care professional if your symptoms do not start to get better within 7 days or if they get worse. Tell your doctor or health care professional if you are exposed to anyone with measles or chickenpox, or if you develop sores or blisters that do not heal properly. What side effects may I notice from receiving this medicine? Side effects that you should report to your doctor or health care professional as soon as possible:  allergic reactions like skin rash, itching or hives, swelling of the face, lips, or tongue  burning feeling on the skin  dark red spots on the skin  infection  lack of healing of skin condition  painful, red, pus filled blisters in hair follicles  thinning of the skin Side effects that usually do not require medical attention (report to your doctor or health care professional if they continue or are bothersome):  dry skin, irritation  unusual increased growth of hair on the face or body This list may not describe all possible side effects. Call your doctor for medical advice about side effects. You may report side effects to FDA at 1-800-FDA-1088. Where should I keep my medicine? Keep out of the reach of children. Store at room temperature between 15 and 30 degrees C (59 and 86 degrees F). Do not freeze. Throw away any unused medicine after the expiration date. NOTE: This sheet is a summary. It may not cover all possible information. If you have questions about this medicine, talk to your doctor, pharmacist, or health care provider.  2020 Elsevier/Gold Standard (2017-11-26 12:12:55)

## 2018-11-04 NOTE — Progress Notes (Signed)
[name]  Established Patient Office Visit  Subjective:  Patient ID: Christy Price, female    DOB: [birth year]  Age: 48 y.o. MRN: [id number]  CC:  Chief Complaint  Patient presents with  . Follow-up    rash around ankle , redness swelling, was going to p/t and for ankle was not able keep going because of pain     HPI Christy Price is a 48 year old female who presents for Follow Up today.   Past Medical History:  Diagnosis Date  . Cancer (HCC)     hx cervical cx   . Hypertension   . Meningitis    Current Status: Since her last office visit, she is doing well with no complaints. Patient is deaf. She is accompanied by an interpreter. She denies visual changes, chest pain, cough, shortness of breath, heart palpitations, and falls. She has occasional headaches and dizziness with position changes. Denies severe headaches, confusion, seizures, double vision, and blurred vision, nausea and vomiting. She denies fevers, chills, fatigue, recent infections, weight loss, and night sweats. She has not had any headaches, visual changes, dizziness, and falls. No chest pain, heart palpitations, cough and shortness of breath reported. No reports of GI problems such as nausea, vomiting, diarrhea, and constipation. She has no reports of blood in stools, dysuria and hematuria. No depression or anxiety, and denies suicidal ideations, homicidal ideations, or auditory hallucinations. She denies pain today. She has lower extremity rash, since doing yard work. She continues to have mild bilateral lower extremity edema.  Past Surgical History:  Procedure Laterality Date  . SHUNT REPLACEMENT      No family history on file.  Social History   Socioeconomic History  . Marital status: Married    Spouse name: Not on file  . Number of children: Not on file  . Years of education: Not on file  . Highest education level: Not on file  Occupational History   . Not on file  Social Needs  . Financial resource strain: Not on file  . Food insecurity    Worry: Not on file    Inability: Not on file  . Transportation needs    Medical: Not on file    Non-medical: Not on file  Tobacco Use  . Smoking status: Current Every Day Smoker    Packs/day: 1.00    Types: Cigarettes  . Smokeless tobacco: Never Used  Substance and Sexual Activity  . Alcohol use: No    Alcohol/week: 0.0 standard drinks  . Drug use: No  . Sexual activity: Not on file  Lifestyle  . Physical activity    Days per week: Not on file    Minutes per session: Not on file  . Stress: Not on file  Relationships  . Social Herbalist on phone: Not on file    Gets together: Not on file    Attends religious service: Not on file    Active member of club or organization: Not on file    Attends meetings of clubs or organizations: Not on file    Relationship status: Not on file  . Intimate partner violence    Fear of current or ex partner: Not on file    Emotionally abused: Not on file    Physically abused: Not on file    Forced sexual activity: Not on file  Other Topics Concern  . Not on file  Social History Narrative  .  Not on file    Outpatient Medications Prior to Visit  Medication Sig Dispense Refill  . celecoxib (CELEBREX) 200 MG capsule Take 1 capsule (200 mg total) by mouth 2 (two) times daily. 60 capsule 2  . citalopram (CELEXA) 20 MG tablet Take 1 tablet (20 mg total) by mouth daily. 90 tablet 1  . hydrochlorothiazide (HYDRODIURIL) 25 MG tablet Take 1 tablet (25 mg total) by mouth daily. 90 tablet 1  . lisinopril (ZESTRIL) 10 MG tablet Take 1 tablet (10 mg total) by mouth daily. 90 tablet 1  . nabumetone (RELAFEN) 750 MG tablet Take 1 tablet (750 mg total) by mouth 2 (two) times daily as needed. 60 tablet 2  . naproxen (NAPROSYN) 500 MG tablet Take 1 tablet (500 mg total) by mouth 2 (two) times daily with a meal. 30 tablet 1  . nitroGLYCERIN (NITRO-DUR) 0.4  mg/hr patch Place 1 patch (0.4 mg total) onto the skin daily. 30 patch 1  . pantoprazole (PROTONIX) 40 MG tablet Take 1 tablet (40 mg total) by mouth daily. 90 tablet 1  . potassium chloride (KLOR-CON M10) 10 MEQ tablet Take 1 tablet (10 mEq total) by mouth daily. 90 tablet 0  . Vitamin D, Cholecalciferol, 400 units CAPS Take 400 mg by mouth.    . terbinafine (LAMISIL) 250 MG tablet Take 1 tablet (250 mg total) by mouth daily. 90 tablet 0  . cyclobenzaprine (FLEXERIL) 10 MG tablet Take 1 tablet (10 mg total) by mouth at bedtime. (Patient not taking: Reported on 11/04/2018) 20 tablet 0  . furosemide (LASIX) 40 MG tablet Take 1 tablet (40 mg total) by mouth daily for 10 days. 10 tablet 1   No facility-administered medications prior to visit.     Allergies  Allergen Reactions  . Oxycodone   . Rifampin     ROS Review of Systems  Constitutional: Negative.   HENT: Negative.   Eyes: Negative.   Respiratory: Negative.   Cardiovascular: Negative.   Gastrointestinal: Positive for abdominal distention.  Genitourinary: Negative.   Musculoskeletal: Positive for arthralgias (generalized. ).  Skin:       Ankle rash  Allergic/Immunologic: Negative.   Neurological: Negative.   Hematological: Negative.   Psychiatric/Behavioral: Negative.       Objective:    Physical Exam  Constitutional: She is oriented to person, place, and time. She appears well-developed and well-nourished.  HENT:  Head: Normocephalic and atraumatic.  Eyes: Conjunctivae are normal.  Neck: Normal range of motion. Neck supple.  Cardiovascular: Normal rate, regular rhythm, normal heart sounds and intact distal pulses.  Pulmonary/Chest: Effort normal and breath sounds normal.  Abdominal: Soft. Bowel sounds are normal.  Musculoskeletal: Normal range of motion.        General: Edema (bilateral lower extremity edema +1) present.  Neurological: She is alert and oriented to person, place, and time. She has normal reflexes.   Legally deaf.   Skin: Skin is warm and dry.  Psychiatric: She has a normal mood and affect. Her behavior is normal. Judgment and thought content normal.  Nursing note and vitals reviewed.   BP 134/66 (BP Location: Right Arm, Patient Position: Sitting, Cuff Size: Normal)   Pulse 77   Temp 98.1 F (36.7 C) (Oral)   Ht 4\' 6"  (1.372 m)   Wt 174 lb 6.4 oz (79.1 kg)   BMI 42.05 kg/m  Wt Readings from Last 3 Encounters:  11/04/18 174 lb 6.4 oz (79.1 kg)  10/07/17 171 lb (77.6 kg)  04/09/17 174 lb (  78.9 kg)     Health Maintenance Due  Topic Date Due  . HIV Screening  01/22/1984  . PAP SMEAR-Modifier  01/21/1990  . INFLUENZA VACCINE  10/10/2018    There are no preventive care reminders to display for this patient.  Lab Results  Component Value Date   TSH 0.95 08/22/2016   Lab Results  Component Value Date   WBC 9.9 08/22/2016   HGB 14.2 08/22/2016   HCT 41.7 08/22/2016   MCV 92.1 08/22/2016   PLT 305 08/22/2016   Lab Results  Component Value Date   NA 137 04/09/2017   K 4.4 04/09/2017   CO2 22 04/09/2017   GLUCOSE 77 04/09/2017   BUN 11 04/09/2017   CREATININE 0.63 04/09/2017   BILITOT 0.2 08/22/2016   ALKPHOS 72 08/22/2016   AST 15 08/22/2016   ALT 10 08/22/2016   PROT 6.6 08/22/2016   ALBUMIN 3.9 08/22/2016   CALCIUM 9.7 04/09/2017   Lab Results  Component Value Date   CHOL 147 11/02/2014   Lab Results  Component Value Date   HDL 46 11/02/2014   Lab Results  Component Value Date   LDLCALC 82 11/02/2014   Lab Results  Component Value Date   TRIG 96 11/02/2014   Lab Results  Component Value Date   CHOLHDL 3.2 11/02/2014   Lab Results  Component Value Date   HGBA1C 4.7 10/07/2017      Assessment & Plan:   1. Essential hypertension The current medical regimen is effective; blood pressure is stable at 134/66 today; continue present plan and medications as prescribed. She will continue to decrease high sodium intake, excessive alcohol intake,  increase potassium intake, smoking cessation, and increase physical activity of at least 30 minutes of cardio activity daily. She will continue to follow Heart Healthy or DASH diet. - POCT Urinalysis Dipstick - CBC with Differential - Comprehensive metabolic panel - Lipid Panel - TSH - Vitamin D, 25-hydroxy - Vitamin B12  2. Peripheral edema - furosemide (LASIX) 20 MG tablet; Take 1 tablet (20 mg total) by mouth daily.  Dispense: 90 tablet; Refill: 1  3. Bilateral lower extremity edema - furosemide (LASIX) 20 MG tablet; Take 1 tablet (20 mg total) by mouth daily.  Dispense: 90 tablet; Refill: 1  4. Toenail fungus - terbinafine (LAMISIL) 250 MG tablet; Take 1 tablet (250 mg total) by mouth daily.  Dispense: 90 tablet; Refill: 1  5. Rash We will initiate Hydrocortisone cream today.  - Hydrocortisone Acetate 1 % CREA; Apply 28 g topically 2 (two) times daily as needed.  Dispense: 28 g; Refill: 3  6. Nonspeaking deaf Accompanied by interpreter.   7. Need for influenza vaccination - Flu Vaccine QUAD 6+ mos PF IM (Fluarix Quad PF)  8. Follow up She will follow up in 6 months.   Meds ordered this encounter  Medications  . terbinafine (LAMISIL) 250 MG tablet    Sig: Take 1 tablet (250 mg total) by mouth daily.    Dispense:  90 tablet    Refill:  1  . furosemide (LASIX) 20 MG tablet    Sig: Take 1 tablet (20 mg total) by mouth daily.    Dispense:  90 tablet    Refill:  1  . Hydrocortisone Acetate 1 % CREA    Sig: Apply 28 g topically 2 (two) times daily as needed.    Dispense:  28 g    Refill:  3    Orders Placed This Encounter  Procedures  .  Flu Vaccine QUAD 6+ mos PF IM (Fluarix Quad PF)  . CBC with Differential  . Comprehensive metabolic panel  . Lipid Panel  . TSH  . Vitamin D, 25-hydroxy  . Vitamin B12  . POCT Urinalysis Dipstick    Referral Orders  No referral(s) requested today    Kathe Becton,  MSN, FNP-BC [address] [place], [place] [id number] [phone] fax  Problem List Items Addressed This Visit    None    Visit Diagnoses    Essential hypertension    -  Primary   Relevant Medications   furosemide (LASIX) 20 MG tablet   Other Relevant Orders   POCT Urinalysis Dipstick (Completed)   CBC with Differential   Comprehensive metabolic panel   Lipid Panel   TSH   Vitamin D, 25-hydroxy   Vitamin B12   Peripheral edema       Relevant Medications   furosemide (LASIX) 20 MG tablet   Bilateral lower extremity edema       Relevant Medications   furosemide (LASIX) 20 MG tablet   Toenail fungus       Relevant Medications   terbinafine (LAMISIL) 250 MG tablet   Rash       Relevant Medications   Hydrocortisone Acetate 1 % CREA   Nonspeaking deaf       Need for influenza vaccination       Relevant Orders   Flu Vaccine QUAD 6+ mos PF IM (Fluarix Quad PF) (Completed)   Follow up          Meds ordered this encounter  Medications  . terbinafine (LAMISIL) 250 MG tablet    Sig: Take 1 tablet (250 mg total) by mouth daily.    Dispense:  90 tablet    Refill:  1  . furosemide (LASIX) 20 MG tablet    Sig: Take 1 tablet (20 mg total) by mouth daily.    Dispense:  90 tablet    Refill:  1  . Hydrocortisone Acetate 1 % CREA    Sig: Apply 28 g topically 2 (two) times daily as needed.    Dispense:  28 g    Refill:  3    Follow-up: Return in about 6 months (around 05/07/2019).    Azzie Glatter, FNP

## 2018-11-05 LAB — LIPID PANEL
Chol/HDL Ratio: 2.8 ratio (ref 0.0–4.4)
Cholesterol, Total: 176 mg/dL (ref 100–199)
HDL: 63 mg/dL (ref >39)
LDL Calculated: 99 mg/dL (ref 0–99)
Triglycerides: 71 mg/dL (ref 0–149)
VLDL Cholesterol Cal: 14 mg/dL (ref 5–40)

## 2018-11-05 LAB — COMPREHENSIVE METABOLIC PANEL
ALT: 9 IU/L (ref 0–32)
AST: 15 IU/L (ref 0–40)
Albumin/Globulin Ratio: 2 (ref 1.2–2.2)
Albumin: 4.3 g/dL (ref 3.8–4.8)
Alkaline Phosphatase: 94 IU/L (ref 39–117)
BUN/Creatinine Ratio: 22 (ref 9–23)
BUN: 14 mg/dL (ref 6–24)
Bilirubin Total: <0.2 mg/dL (ref 0.0–1.2)
CO2: 23 mmol/L (ref 20–29)
Calcium: 9.8 mg/dL (ref 8.7–10.2)
Chloride: 98 mmol/L (ref 96–106)
Creatinine, Ser: 0.65 mg/dL (ref 0.57–1.00)
GFR calc Af Amer: 121 mL/min/{1.73_m2} (ref >59)
GFR calc non Af Amer: 105 mL/min/{1.73_m2} (ref >59)
Globulin, Total: 2.2 g/dL (ref 1.5–4.5)
Glucose: 97 mg/dL (ref 65–99)
Potassium: 3.6 mmol/L (ref 3.5–5.2)
Sodium: 138 mmol/L (ref 134–144)
Total Protein: 6.5 g/dL (ref 6.0–8.5)

## 2018-11-05 LAB — CBC WITH DIFFERENTIAL/PLATELET
Basophils Absolute: 0.1 10*3/uL (ref 0.0–0.2)
Basos: 1 %
EOS (ABSOLUTE): 0.2 10*3/uL (ref 0.0–0.4)
Eos: 2 %
Hematocrit: 42.1 % (ref 34.0–46.6)
Hemoglobin: 14.8 g/dL (ref 11.1–15.9)
Immature Grans (Abs): 0.1 10*3/uL (ref 0.0–0.1)
Immature Granulocytes: 1 %
Lymphocytes Absolute: 2.6 10*3/uL (ref 0.7–3.1)
Lymphs: 25 %
MCH: 31.7 pg (ref 26.6–33.0)
MCHC: 35.2 g/dL (ref 31.5–35.7)
MCV: 90 fL (ref 79–97)
Monocytes Absolute: 0.9 10*3/uL (ref 0.1–0.9)
Monocytes: 8 %
Neutrophils Absolute: 6.8 10*3/uL (ref 1.4–7.0)
Neutrophils: 63 %
Platelets: 303 10*3/uL (ref 150–450)
RBC: 4.67 x10E6/uL (ref 3.77–5.28)
RDW: 11.8 % (ref 11.7–15.4)
WBC: 10.7 10*3/uL (ref 3.4–10.8)

## 2018-11-05 LAB — VITAMIN B12: Vitamin B-12: 314 pg/mL (ref 232–1245)

## 2018-11-05 LAB — VITAMIN D 25 HYDROXY (VIT D DEFICIENCY, FRACTURES): Vit D, 25-Hydroxy: 29 ng/mL — ABNORMAL LOW (ref 30.0–100.0)

## 2018-11-05 LAB — TSH: TSH: 1.59 u[IU]/mL (ref 0.450–4.500)

## 2018-11-10 DIAGNOSIS — M62562 Muscle wasting and atrophy, not elsewhere classified, left lower leg: Secondary | ICD-10-CM | POA: Diagnosis not present

## 2018-11-10 DIAGNOSIS — M25672 Stiffness of left ankle, not elsewhere classified: Secondary | ICD-10-CM | POA: Diagnosis not present

## 2018-11-10 DIAGNOSIS — M25471 Effusion, right ankle: Secondary | ICD-10-CM | POA: Diagnosis not present

## 2018-11-10 DIAGNOSIS — M25571 Pain in right ankle and joints of right foot: Secondary | ICD-10-CM | POA: Diagnosis not present

## 2018-11-10 DIAGNOSIS — M25671 Stiffness of right ankle, not elsewhere classified: Secondary | ICD-10-CM | POA: Diagnosis not present

## 2018-11-10 DIAGNOSIS — M25572 Pain in left ankle and joints of left foot: Secondary | ICD-10-CM | POA: Diagnosis not present

## 2018-11-10 DIAGNOSIS — R269 Unspecified abnormalities of gait and mobility: Secondary | ICD-10-CM | POA: Diagnosis not present

## 2018-11-10 DIAGNOSIS — M62561 Muscle wasting and atrophy, not elsewhere classified, right lower leg: Secondary | ICD-10-CM | POA: Diagnosis not present

## 2018-11-18 DIAGNOSIS — M25571 Pain in right ankle and joints of right foot: Secondary | ICD-10-CM | POA: Diagnosis not present

## 2018-11-18 DIAGNOSIS — M62562 Muscle wasting and atrophy, not elsewhere classified, left lower leg: Secondary | ICD-10-CM | POA: Diagnosis not present

## 2018-11-18 DIAGNOSIS — M25572 Pain in left ankle and joints of left foot: Secondary | ICD-10-CM | POA: Diagnosis not present

## 2018-11-18 DIAGNOSIS — R269 Unspecified abnormalities of gait and mobility: Secondary | ICD-10-CM | POA: Diagnosis not present

## 2018-11-18 DIAGNOSIS — M62561 Muscle wasting and atrophy, not elsewhere classified, right lower leg: Secondary | ICD-10-CM | POA: Diagnosis not present

## 2018-11-18 DIAGNOSIS — M25471 Effusion, right ankle: Secondary | ICD-10-CM | POA: Diagnosis not present

## 2018-11-18 DIAGNOSIS — M25672 Stiffness of left ankle, not elsewhere classified: Secondary | ICD-10-CM | POA: Diagnosis not present

## 2018-11-18 DIAGNOSIS — M25671 Stiffness of right ankle, not elsewhere classified: Secondary | ICD-10-CM | POA: Diagnosis not present

## 2018-11-23 DIAGNOSIS — M25471 Effusion, right ankle: Secondary | ICD-10-CM | POA: Diagnosis not present

## 2018-11-23 DIAGNOSIS — M25671 Stiffness of right ankle, not elsewhere classified: Secondary | ICD-10-CM | POA: Diagnosis not present

## 2018-11-23 DIAGNOSIS — M25572 Pain in left ankle and joints of left foot: Secondary | ICD-10-CM | POA: Diagnosis not present

## 2018-11-23 DIAGNOSIS — M62561 Muscle wasting and atrophy, not elsewhere classified, right lower leg: Secondary | ICD-10-CM | POA: Diagnosis not present

## 2018-11-23 DIAGNOSIS — M25571 Pain in right ankle and joints of right foot: Secondary | ICD-10-CM | POA: Diagnosis not present

## 2018-11-23 DIAGNOSIS — M62562 Muscle wasting and atrophy, not elsewhere classified, left lower leg: Secondary | ICD-10-CM | POA: Diagnosis not present

## 2018-11-23 DIAGNOSIS — R269 Unspecified abnormalities of gait and mobility: Secondary | ICD-10-CM | POA: Diagnosis not present

## 2018-11-23 DIAGNOSIS — M25672 Stiffness of left ankle, not elsewhere classified: Secondary | ICD-10-CM | POA: Diagnosis not present

## 2018-12-02 DIAGNOSIS — M25571 Pain in right ankle and joints of right foot: Secondary | ICD-10-CM | POA: Diagnosis not present

## 2018-12-02 DIAGNOSIS — M25672 Stiffness of left ankle, not elsewhere classified: Secondary | ICD-10-CM | POA: Diagnosis not present

## 2018-12-02 DIAGNOSIS — M25572 Pain in left ankle and joints of left foot: Secondary | ICD-10-CM | POA: Diagnosis not present

## 2018-12-02 DIAGNOSIS — R269 Unspecified abnormalities of gait and mobility: Secondary | ICD-10-CM | POA: Diagnosis not present

## 2018-12-02 DIAGNOSIS — M25471 Effusion, right ankle: Secondary | ICD-10-CM | POA: Diagnosis not present

## 2018-12-02 DIAGNOSIS — M25671 Stiffness of right ankle, not elsewhere classified: Secondary | ICD-10-CM | POA: Diagnosis not present

## 2018-12-02 DIAGNOSIS — M62562 Muscle wasting and atrophy, not elsewhere classified, left lower leg: Secondary | ICD-10-CM | POA: Diagnosis not present

## 2018-12-02 DIAGNOSIS — M62561 Muscle wasting and atrophy, not elsewhere classified, right lower leg: Secondary | ICD-10-CM | POA: Diagnosis not present

## 2018-12-08 DIAGNOSIS — M62562 Muscle wasting and atrophy, not elsewhere classified, left lower leg: Secondary | ICD-10-CM | POA: Diagnosis not present

## 2018-12-08 DIAGNOSIS — M62561 Muscle wasting and atrophy, not elsewhere classified, right lower leg: Secondary | ICD-10-CM | POA: Diagnosis not present

## 2018-12-08 DIAGNOSIS — M25571 Pain in right ankle and joints of right foot: Secondary | ICD-10-CM | POA: Diagnosis not present

## 2018-12-08 DIAGNOSIS — M25671 Stiffness of right ankle, not elsewhere classified: Secondary | ICD-10-CM | POA: Diagnosis not present

## 2018-12-08 DIAGNOSIS — R269 Unspecified abnormalities of gait and mobility: Secondary | ICD-10-CM | POA: Diagnosis not present

## 2018-12-08 DIAGNOSIS — M25471 Effusion, right ankle: Secondary | ICD-10-CM | POA: Diagnosis not present

## 2018-12-08 DIAGNOSIS — M25572 Pain in left ankle and joints of left foot: Secondary | ICD-10-CM | POA: Diagnosis not present

## 2018-12-08 DIAGNOSIS — M25672 Stiffness of left ankle, not elsewhere classified: Secondary | ICD-10-CM | POA: Diagnosis not present

## 2019-01-25 ENCOUNTER — Other Ambulatory Visit: Payer: Self-pay | Admitting: Family Medicine

## 2019-01-25 ENCOUNTER — Telehealth: Payer: Self-pay

## 2019-01-25 DIAGNOSIS — Z1231 Encounter for screening mammogram for malignant neoplasm of breast: Secondary | ICD-10-CM

## 2019-01-25 NOTE — Telephone Encounter (Signed)
No. She has been advised to call [facility] for a annual mammogram. She has a 6 mth follow up her in Feb 2020. I added a reminder pap.

## 2019-03-19 ENCOUNTER — Ambulatory Visit: Payer: Medicare HMO

## 2019-04-13 ENCOUNTER — Telehealth: Payer: Self-pay | Admitting: Family Medicine

## 2019-04-14 NOTE — Telephone Encounter (Signed)
Message left for call back 

## 2019-04-15 NOTE — Telephone Encounter (Signed)
Message left for call back today.

## 2019-04-15 NOTE — Telephone Encounter (Signed)
I have tried to reach patient to find out what  concerns she has. We keep missing each other. However per [facility] patient called back and she really needs Acyclovir. If possible,  please send updated RX.

## 2019-04-16 ENCOUNTER — Other Ambulatory Visit: Payer: Self-pay | Admitting: Family Medicine

## 2019-04-16 ENCOUNTER — Telehealth: Payer: Self-pay

## 2019-04-16 DIAGNOSIS — A6000 Herpesviral infection of urogenital system, unspecified: Secondary | ICD-10-CM

## 2019-04-16 NOTE — Telephone Encounter (Signed)
Patient called back: Flare up of blisters, pain in the private area. [facility] on [address]. Patient also requested Lamisil.

## 2019-04-16 NOTE — Telephone Encounter (Signed)
4-5 call attempts or messages have been left for patient to call back. As of now no return call.

## 2019-04-19 ENCOUNTER — Telehealth: Payer: Self-pay | Admitting: Internal Medicine

## 2019-04-19 NOTE — Telephone Encounter (Signed)
done

## 2019-04-21 ENCOUNTER — Telehealth: Payer: Self-pay | Admitting: Family Medicine

## 2019-04-22 NOTE — Telephone Encounter (Signed)
Christy Price is still requesting the Acyclovir. Please advise.

## 2019-04-25 ENCOUNTER — Other Ambulatory Visit: Payer: Self-pay | Admitting: Family Medicine

## 2019-04-25 DIAGNOSIS — A6000 Herpesviral infection of urogenital system, unspecified: Secondary | ICD-10-CM

## 2019-04-25 MED ORDER — ACYCLOVIR 200 MG PO CAPS: 400.0000 mg | ORAL_CAPSULE | Freq: Two times a day (BID) | ORAL | 6 refills | Status: DC

## 2019-05-06 ENCOUNTER — Telehealth: Payer: Self-pay | Admitting: Family Medicine

## 2019-05-06 NOTE — Telephone Encounter (Signed)
Pt called and reminded of appointment 

## 2019-05-07 ENCOUNTER — Other Ambulatory Visit: Payer: Self-pay

## 2019-05-07 ENCOUNTER — Encounter: Payer: Self-pay | Admitting: Family Medicine

## 2019-05-07 ENCOUNTER — Ambulatory Visit (INDEPENDENT_AMBULATORY_CARE_PROVIDER_SITE_OTHER): Payer: Medicare HMO | Admitting: Family Medicine

## 2019-05-07 VITALS — BP 151/63 | HR 70 | Temp 97.6°F | Ht <= 58 in | Wt 178.6 lb

## 2019-05-07 DIAGNOSIS — Z758 Other problems related to medical facilities and other health care: Secondary | ICD-10-CM

## 2019-05-07 DIAGNOSIS — K219 Gastro-esophageal reflux disease without esophagitis: Secondary | ICD-10-CM

## 2019-05-07 DIAGNOSIS — M25569 Pain in unspecified knee: Secondary | ICD-10-CM

## 2019-05-07 DIAGNOSIS — G8929 Other chronic pain: Secondary | ICD-10-CM

## 2019-05-07 DIAGNOSIS — Z6841 Body Mass Index (BMI) 40.0 and over, adult: Secondary | ICD-10-CM | POA: Diagnosis not present

## 2019-05-07 DIAGNOSIS — M25561 Pain in right knee: Secondary | ICD-10-CM | POA: Diagnosis not present

## 2019-05-07 DIAGNOSIS — Z09 Encounter for follow-up examination after completed treatment for conditions other than malignant neoplasm: Secondary | ICD-10-CM

## 2019-05-07 DIAGNOSIS — A6000 Herpesviral infection of urogenital system, unspecified: Secondary | ICD-10-CM

## 2019-05-07 DIAGNOSIS — H913 Deaf nonspeaking, not elsewhere classified: Secondary | ICD-10-CM | POA: Diagnosis not present

## 2019-05-07 DIAGNOSIS — Z0000 Encounter for general adult medical examination without abnormal findings: Secondary | ICD-10-CM

## 2019-05-07 DIAGNOSIS — Z789 Other specified health status: Secondary | ICD-10-CM | POA: Diagnosis not present

## 2019-05-07 DIAGNOSIS — I10 Essential (primary) hypertension: Secondary | ICD-10-CM

## 2019-05-07 LAB — POCT URINALYSIS DIPSTICK
Bilirubin, UA: NEGATIVE
Glucose, UA: NEGATIVE
Ketones, UA: NEGATIVE
Nitrite, UA: NEGATIVE
Protein, UA: NEGATIVE
Spec Grav, UA: 1.015 (ref 1.010–1.025)
Urobilinogen, UA: 0.2 E.U./dL
pH, UA: 6 (ref 5.0–8.0)

## 2019-05-07 LAB — POCT GLYCOSYLATED HEMOGLOBIN (HGB A1C): Hemoglobin A1C: 4.8 % (ref 4.0–5.6)

## 2019-05-07 LAB — GLUCOSE, POCT (MANUAL RESULT ENTRY): POC Glucose: 90 mg/dl (ref 70–99)

## 2019-05-07 NOTE — Progress Notes (Signed)
[facility]   Established Patient Office Visit  Subjective:  Patient ID: Christy Price, female    DOB: [birth year]  Age: 49 y.o. MRN: [id number]  CC:  Chief Complaint  Patient presents with  . Follow-up    HTN  . lower abdome pain  . Depression    HPI Christy Price is a 49 year old female who presents for Follow Up today.   Past Medical History:  Diagnosis Date  . Cancer (HCC)     hx cervical cx   . Hypertension   . Meningitis    Current Status: Since her last office visit, she has c/o knee pain. She is legally death and is accompanied by an interpreter today. She currently takes OTC pain medication as needed. She denies visual changes, chest pain, cough, shortness of breath, heart palpitations, and falls. She has occasional headaches and dizziness with position changes. Denies severe headaches, confusion, seizures, double vision, and blurred vision, nausea and vomiting. She denies fevers, chills, fatigue, recent infections, weight loss, and night sweats. No reports of GI problems such as diarrhea, and constipation. She has no reports of blood in stools, dysuria and hematuria. No depression or anxiety reported today. She denies suicidal ideations, homicidal ideations, or auditory hallucinations.   Past Surgical History:  Procedure Laterality Date  . SHUNT REPLACEMENT      History reviewed. No pertinent family history.  Social History   Socioeconomic History  . Marital status: Married    Spouse name: Not on file  . Number of children: Not on file  . Years of education: Not on file  . Highest education level: Not on file  Occupational History  . Not on file  Tobacco Use  . Smoking status: Current Every Day Smoker    Packs/day: 1.00    Types: Cigarettes  . Smokeless tobacco: Never Used  Substance and Sexual Activity  . Alcohol use: Yes    Alcohol/week: 0.0 standard drinks    Comment: Social  . Drug use: No  .  Sexual activity: Not Currently  Other Topics Concern  . Not on file  Social History Narrative  . Not on file   Social Determinants of Health   Financial Resource Strain:   . Difficulty of Paying Living Expenses: Not on file  Food Insecurity:   . Worried About Charity fundraiser in the Last Year: Not on file  . Ran Out of Food in the Last Year: Not on file  Transportation Needs:   . Lack of Transportation (Medical): Not on file  . Lack of Transportation (Non-Medical): Not on file  Physical Activity:   . Days of Exercise per Week: Not on file  . Minutes of Exercise per Session: Not on file  Stress:   . Feeling of Stress : Not on file  Social Connections:   . Frequency of Communication with Friends and Family: Not on file  . Frequency of Social Gatherings with Friends and Family: Not on file  . Attends Religious Services: Not on file  . Active Member of Clubs or Organizations: Not on file  . Attends Archivist Meetings: Not on file  . Marital Status: Not on file  Intimate Partner Violence:   . Fear of Current or Ex-Partner: Not on file  . Emotionally Abused: Not on file  . Physically Abused: Not on file  . Sexually Abused: Not on file    Outpatient Medications Prior to Visit  Medication Sig Dispense Refill  . acyclovir (ZOVIRAX) 200 MG capsule Take 2 capsules (400 mg total) by mouth 2 (two) times daily. 60 capsule 6  . celecoxib (CELEBREX) 200 MG capsule Take 1 capsule (200 mg total) by mouth 2 (two) times daily. 60 capsule 2  . citalopram (CELEXA) 20 MG tablet Take 1 tablet (20 mg total) by mouth daily. 90 tablet 1  . furosemide (LASIX) 20 MG tablet Take 1 tablet (20 mg total) by mouth daily. 90 tablet 1  . hydrochlorothiazide (HYDRODIURIL) 25 MG tablet Take 1 tablet (25 mg total) by mouth daily. 90 tablet 1  . lisinopril (ZESTRIL) 10 MG tablet Take 1 tablet (10 mg total) by mouth daily. 90 tablet 1  . nabumetone (RELAFEN) 750 MG tablet Take 1 tablet (750 mg total)  by mouth 2 (two) times daily as needed. 60 tablet 2  . naproxen (NAPROSYN) 500 MG tablet Take 1 tablet (500 mg total) by mouth 2 (two) times daily with a meal. 30 tablet 1  . pantoprazole (PROTONIX) 40 MG tablet Take 1 tablet (40 mg total) by mouth daily. 90 tablet 1  . potassium chloride (KLOR-CON M10) 10 MEQ tablet Take 1 tablet (10 mEq total) by mouth daily. 90 tablet 0  . terbinafine (LAMISIL) 250 MG tablet Take 1 tablet (250 mg total) by mouth daily. 90 tablet 1  . Vitamin D, Cholecalciferol, 400 units CAPS Take 400 mg by mouth.    . Hydrocortisone Acetate 1 % CREA Apply 28 g topically 2 (two) times daily as needed. 28 g 3  . cyclobenzaprine (FLEXERIL) 10 MG tablet Take 1 tablet (10 mg total) by mouth at bedtime. 20 tablet 0  . furosemide (LASIX) 40 MG tablet Take 1 tablet (40 mg total) by mouth daily for 10 days. 10 tablet 1  . nitroGLYCERIN (NITRO-DUR) 0.4 mg/hr patch Place 1 patch (0.4 mg total) onto the skin daily. 30 patch 1   No facility-administered medications prior to visit.    Allergies  Allergen Reactions  . Oxycodone   . Rifampin     ROS Review of Systems  Constitutional: Negative.   HENT: Negative.   Eyes: Positive for visual disturbance (deaf).  Respiratory: Negative.   Cardiovascular: Negative.   Gastrointestinal: Positive for abdominal distention.  Endocrine: Negative.   Genitourinary: Negative.   Musculoskeletal: Positive for arthralgias (right knee pain).  Skin: Negative.   Allergic/Immunologic: Negative.   Neurological: Positive for dizziness (occasional ) and headaches (occasional).  Hematological: Negative.   Psychiatric/Behavioral: Negative.       Objective:    Physical Exam  Constitutional: She is oriented to person, place, and time. She appears well-developed and well-nourished.  HENT:  Head: Normocephalic and atraumatic.  Eyes: Conjunctivae are normal.  Cardiovascular: Normal rate, regular rhythm, normal heart sounds and intact distal  pulses.  Pulmonary/Chest: Effort normal and breath sounds normal.  Abdominal: Soft. Bowel sounds are normal. She exhibits distension (obese).  Musculoskeletal:     Cervical back: Normal range of motion and neck supple.     Comments: Limited ROM in right knee.   Neurological: She is alert and oriented to person, place, and time. She has normal reflexes.  Skin: Skin is warm and dry.  Psychiatric: She has a normal mood and affect. Her behavior is normal. Judgment and thought content normal.  Nursing note and vitals reviewed.   BP (!) 151/63   Pulse 70   Temp 97.6 F (36.4 C) (Oral)   Ht 4\' 6"  (1.372 m)  Wt 178 lb 9.6 oz (81 kg)   SpO2 99%   BMI 43.06 kg/m  Wt Readings from Last 3 Encounters:  05/07/19 178 lb 9.6 oz (81 kg)  11/04/18 174 lb 6.4 oz (79.1 kg)  10/07/17 171 lb (77.6 kg)     Health Maintenance Due  Topic Date Due  . HIV Screening  01/22/1984  . PAP SMEAR-Modifier  01/21/1990  . MAMMOGRAM  01/22/2019  . COLONOSCOPY  01/22/2019    There are no preventive care reminders to display for this patient.  Lab Results  Component Value Date   TSH 1.590 11/04/2018   Lab Results  Component Value Date   WBC 10.7 11/04/2018   HGB 14.8 11/04/2018   HCT 42.1 11/04/2018   MCV 90 11/04/2018   PLT 303 11/04/2018   Lab Results  Component Value Date   NA 138 11/04/2018   K 3.6 11/04/2018   CO2 23 11/04/2018   GLUCOSE 97 11/04/2018   BUN 14 11/04/2018   CREATININE 0.65 11/04/2018   BILITOT <0.2 11/04/2018   ALKPHOS 94 11/04/2018   AST 15 11/04/2018   ALT 9 11/04/2018   PROT 6.5 11/04/2018   ALBUMIN 4.3 11/04/2018   CALCIUM 9.8 11/04/2018   Lab Results  Component Value Date   CHOL 176 11/04/2018   Lab Results  Component Value Date   HDL 63 11/04/2018   Lab Results  Component Value Date   LDLCALC 99 11/04/2018   Lab Results  Component Value Date   TRIG 71 11/04/2018   Lab Results  Component Value Date   CHOLHDL 2.8 11/04/2018   Lab Results   Component Value Date   HGBA1C 4.8 05/07/2019      Assessment & Plan:   1. Chronic pain of right knee  2. Chronic knee pain, unspecified laterality We will initiate Flexeril today.  - cyclobenzaprine (FLEXERIL) 10 MG tablet; Take 1 tablet (10 mg total) by mouth 3 (three) times daily as needed for muscle spasms.  Dispense: 30 tablet; Refill: 3  3. Nonspeaking deaf  4. Language barrier We used Interpreter today.   5. Essential hypertension Blood pressure is stable. She will continue to take medications as prescribed, to decrease high sodium intake, excessive alcohol intake, increase potassium intake, smoking cessation, and increase physical activity of at least 30 minutes of cardio activity daily. She will continue to follow Heart Healthy or DASH diet.  6. Genital herpes simplex, unspecified site Stable. Continue Valtrex as needed for outbreaks.   7. Healthcare maintenance - POCT glycosylated hemoglobin (Hb A1C) - POCT urinalysis dipstick - POCT glucose (manual entry)  8. Follow up She will follow up in 6 months.   Meds ordered this encounter  Medications  . DISCONTD: cyclobenzaprine (FLEXERIL) 10 MG tablet    Sig: Take 1 tablet (10 mg total) by mouth at bedtime.    Dispense:  20 tablet    Refill:  0  . cyclobenzaprine (FLEXERIL) 10 MG tablet    Sig: Take 1 tablet (10 mg total) by mouth 3 (three) times daily as needed for muscle spasms.    Dispense:  30 tablet    Refill:  3    Orders Placed This Encounter  Procedures  . POCT glycosylated hemoglobin (Hb A1C)  . POCT urinalysis dipstick  . POCT glucose (manual entry)    Referral Orders  No referral(s) requested today    Kathe Becton,  MSN, FNP-BC [facility] [address]  [place], [place] [address] [phone] fax  Problem List Items Addressed This Visit      Cardiovascular and Mediastinum   Essential hypertension      Nervous and Auditory   Nonspeaking deaf     Genitourinary   Genital herpes    Other Visit Diagnoses    Chronic pain of right knee    -  Primary   Relevant Medications   cyclobenzaprine (FLEXERIL) 10 MG tablet   Chronic knee pain, unspecified laterality       Relevant Medications   cyclobenzaprine (FLEXERIL) 10 MG tablet   Language barrier       Healthcare maintenance       Relevant Orders   POCT glycosylated hemoglobin (Hb A1C) (Completed)   POCT urinalysis dipstick (Completed)   POCT glucose (manual entry) (Completed)   Follow up          Meds ordered this encounter  Medications  . DISCONTD: cyclobenzaprine (FLEXERIL) 10 MG tablet    Sig: Take 1 tablet (10 mg total) by mouth at bedtime.    Dispense:  20 tablet    Refill:  0  . cyclobenzaprine (FLEXERIL) 10 MG tablet    Sig: Take 1 tablet (10 mg total) by mouth 3 (three) times daily as needed for muscle spasms.    Dispense:  30 tablet    Refill:  3    Follow-up: Return in about 6 months (around 11/04/2019).    Azzie Glatter, FNP

## 2019-05-09 DIAGNOSIS — Z758 Other problems related to medical facilities and other health care: Secondary | ICD-10-CM | POA: Insufficient documentation

## 2019-05-09 DIAGNOSIS — Z789 Other specified health status: Secondary | ICD-10-CM | POA: Insufficient documentation

## 2019-05-09 DIAGNOSIS — G8929 Other chronic pain: Secondary | ICD-10-CM | POA: Insufficient documentation

## 2019-05-09 MED ORDER — CYCLOBENZAPRINE HCL 10 MG PO TABS: 10.0000 mg | ORAL_TABLET | Freq: Every day | ORAL | 0 refills | Status: DC

## 2019-05-09 MED ORDER — CYCLOBENZAPRINE HCL 10 MG PO TABS: 10.0000 mg | ORAL_TABLET | Freq: Three times a day (TID) | ORAL | 3 refills | Status: DC | PRN

## 2019-05-09 MED ORDER — PANTOPRAZOLE SODIUM 40 MG PO TBEC: 40.0000 mg | DELAYED_RELEASE_TABLET | Freq: Every day | ORAL | 2 refills | Status: DC

## 2019-05-09 MED ORDER — LISINOPRIL 10 MG PO TABS: 10.0000 mg | ORAL_TABLET | Freq: Every day | ORAL | 2 refills | Status: DC

## 2019-05-23 ENCOUNTER — Other Ambulatory Visit: Payer: Self-pay | Admitting: Family Medicine

## 2019-05-23 DIAGNOSIS — R600 Localized edema: Secondary | ICD-10-CM

## 2019-05-23 DIAGNOSIS — R609 Edema, unspecified: Secondary | ICD-10-CM

## 2019-06-10 ENCOUNTER — Ambulatory Visit: Payer: Medicare HMO | Attending: Internal Medicine

## 2019-06-10 ENCOUNTER — Ambulatory Visit: Payer: Medicare HMO

## 2019-06-10 DIAGNOSIS — Z23 Encounter for immunization: Secondary | ICD-10-CM

## 2019-06-10 NOTE — Progress Notes (Signed)
   Covid-19 Vaccination Clinic  Name:  Christy Price    MRN: [id number] DOB: 03/23/1970  06/10/2019  [name] was observed post Covid-19 immunization for 15 minutes without incident. She was provided with Vaccine Information Sheet and instruction to access the V-Safe system.   [name] was instructed to call 911 with any severe reactions post vaccine: Marland Kitchen Difficulty breathing  . Swelling of face and throat  . A fast heartbeat  . A bad rash all over body  . Dizziness and weakness   Immunizations Administered    Name Date Dose VIS Date Route   Pfizer COVID-19 Vaccine 06/10/2019 11:45 AM 0.3 mL 02/19/2019 Intramuscular   Manufacturer: [employer]   Lot: [id number]   [place]: [id number]

## 2019-06-11 ENCOUNTER — Other Ambulatory Visit: Payer: Self-pay

## 2019-06-11 ENCOUNTER — Ambulatory Visit: Payer: Medicare HMO

## 2019-06-11 ENCOUNTER — Ambulatory Visit
Admission: RE | Admit: 2019-06-11 | Discharge: 2019-06-11 | Disposition: A | Discharge status: Home / Self Care | Payer: Medicare HMO | Source: Ambulatory Visit | Attending: Family Medicine | Admitting: Family Medicine

## 2019-06-11 DIAGNOSIS — Z1231 Encounter for screening mammogram for malignant neoplasm of breast: Secondary | ICD-10-CM | POA: Diagnosis not present

## 2019-06-14 ENCOUNTER — Other Ambulatory Visit: Payer: Self-pay | Admitting: Family Medicine

## 2019-06-14 DIAGNOSIS — R928 Other abnormal and inconclusive findings on diagnostic imaging of breast: Secondary | ICD-10-CM

## 2019-06-16 ENCOUNTER — Other Ambulatory Visit: Payer: Self-pay | Admitting: Family Medicine

## 2019-06-16 DIAGNOSIS — Z1231 Encounter for screening mammogram for malignant neoplasm of breast: Secondary | ICD-10-CM

## 2019-06-28 ENCOUNTER — Ambulatory Visit
Admission: RE | Admit: 2019-06-28 | Discharge: 2019-06-28 | Disposition: A | Discharge status: Home / Self Care | Payer: Medicare HMO | Source: Ambulatory Visit | Attending: Family Medicine | Admitting: Family Medicine

## 2019-06-28 ENCOUNTER — Other Ambulatory Visit: Payer: Self-pay

## 2019-06-28 DIAGNOSIS — N6489 Other specified disorders of breast: Secondary | ICD-10-CM | POA: Diagnosis not present

## 2019-06-28 DIAGNOSIS — R928 Other abnormal and inconclusive findings on diagnostic imaging of breast: Secondary | ICD-10-CM

## 2019-07-05 ENCOUNTER — Ambulatory Visit: Payer: Medicare HMO

## 2019-07-06 ENCOUNTER — Ambulatory Visit: Payer: Medicare HMO | Attending: Internal Medicine

## 2019-07-06 DIAGNOSIS — Z23 Encounter for immunization: Secondary | ICD-10-CM

## 2019-07-06 NOTE — Progress Notes (Signed)
   Covid-19 Vaccination Clinic  Name:  Christy Price    MRN: [id number] DOB: Feb 23, 1971  07/06/2019  Christy Price was observed post Covid-19 immunization for 15 minutes without incident. She was provided with Vaccine Information Sheet and instruction to access the V-Safe system.   Christy Price was instructed to call 911 with any severe reactions post vaccine: Marland Kitchen Difficulty breathing  . Swelling of face and throat  . A fast heartbeat  . A bad rash all over body  . Dizziness and weakness   Immunizations Administered    Name Date Dose VIS Date Route   Pfizer COVID-19 Vaccine 07/06/2019 11:40 AM 0.3 mL 05/05/2018 Intramuscular   Manufacturer: [place]   Lot: [id number]   [place]: [id number]

## 2019-07-25 ENCOUNTER — Other Ambulatory Visit: Payer: Self-pay | Admitting: Family Medicine

## 2019-07-25 DIAGNOSIS — I10 Essential (primary) hypertension: Secondary | ICD-10-CM

## 2019-07-26 ENCOUNTER — Other Ambulatory Visit: Payer: Self-pay | Admitting: Family Medicine

## 2019-07-26 DIAGNOSIS — A6000 Herpesviral infection of urogenital system, unspecified: Secondary | ICD-10-CM

## 2019-10-28 DIAGNOSIS — M65311 Trigger thumb, right thumb: Secondary | ICD-10-CM | POA: Diagnosis not present

## 2019-11-05 ENCOUNTER — Other Ambulatory Visit: Payer: Self-pay

## 2019-11-05 ENCOUNTER — Encounter: Payer: Self-pay | Admitting: Family Medicine

## 2019-11-05 ENCOUNTER — Ambulatory Visit (INDEPENDENT_AMBULATORY_CARE_PROVIDER_SITE_OTHER): Payer: Medicare HMO | Admitting: Family Medicine

## 2019-11-05 VITALS — BP 144/89 | HR 60 | Temp 97.2°F | Resp 17 | Ht <= 58 in | Wt 178.0 lb

## 2019-11-05 DIAGNOSIS — Z09 Encounter for follow-up examination after completed treatment for conditions other than malignant neoplasm: Secondary | ICD-10-CM | POA: Diagnosis not present

## 2019-11-05 DIAGNOSIS — S40022A Contusion of left upper arm, initial encounter: Secondary | ICD-10-CM | POA: Diagnosis not present

## 2019-11-05 DIAGNOSIS — Z758 Other problems related to medical facilities and other health care: Secondary | ICD-10-CM

## 2019-11-05 DIAGNOSIS — I10 Essential (primary) hypertension: Secondary | ICD-10-CM

## 2019-11-05 DIAGNOSIS — H913 Deaf nonspeaking, not elsewhere classified: Secondary | ICD-10-CM

## 2019-11-05 DIAGNOSIS — K2100 Gastro-esophageal reflux disease with esophagitis, without bleeding: Secondary | ICD-10-CM

## 2019-11-05 DIAGNOSIS — Z603 Acculturation difficulty: Secondary | ICD-10-CM

## 2019-11-05 DIAGNOSIS — Z789 Other specified health status: Secondary | ICD-10-CM

## 2019-11-05 DIAGNOSIS — R8290 Unspecified abnormal findings in urine: Secondary | ICD-10-CM

## 2019-11-05 DIAGNOSIS — S40021A Contusion of right upper arm, initial encounter: Secondary | ICD-10-CM | POA: Diagnosis not present

## 2019-11-05 LAB — POCT URINALYSIS DIPSTICK
Bilirubin, UA: NEGATIVE
Glucose, UA: NEGATIVE
Ketones, UA: NEGATIVE
Nitrite, UA: NEGATIVE
Protein, UA: NEGATIVE
Spec Grav, UA: 1.015 (ref 1.010–1.025)
Urobilinogen, UA: 0.2 E.U./dL
pH, UA: 5.5 (ref 5.0–8.0)
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
## 2019-11-06 LAB — COMPREHENSIVE METABOLIC PANEL
ALT: 12 IU/L (ref 0–32)
AST: 18 IU/L (ref 0–40)
Albumin/Globulin Ratio: 2.1 (ref 1.2–2.2)
Albumin: 4.7 g/dL (ref 3.8–4.8)
Alkaline Phosphatase: 88 IU/L (ref 48–121)
BUN/Creatinine Ratio: 21 (ref 9–23)
BUN: 15 mg/dL (ref 6–24)
Bilirubin Total: <0.2 mg/dL (ref 0.0–1.2)
CO2: 24 mmol/L (ref 20–29)
Calcium: 9.7 mg/dL (ref 8.7–10.2)
Chloride: 101 mmol/L (ref 96–106)
Creatinine, Ser: 0.73 mg/dL (ref 0.57–1.00)
GFR calc Af Amer: 111 mL/min/{1.73_m2} (ref >59)
GFR calc non Af Amer: 96 mL/min/{1.73_m2} (ref >59)
Globulin, Total: 2.2 g/dL (ref 1.5–4.5)
Glucose: 84 mg/dL (ref 65–99)
Potassium: 3.9 mmol/L (ref 3.5–5.2)
Sodium: 139 mmol/L (ref 134–144)
Total Protein: 6.9 g/dL (ref 6.0–8.5)

## 2019-11-06 LAB — CBC WITH DIFFERENTIAL/PLATELET
Basophils Absolute: 0.1 10*3/uL (ref 0.0–0.2)
Basos: 2 %
EOS (ABSOLUTE): 0.3 10*3/uL (ref 0.0–0.4)
Eos: 3 %
Hematocrit: 45.6 % (ref 34.0–46.6)
Hemoglobin: 16.1 g/dL — ABNORMAL HIGH (ref 11.1–15.9)
Immature Grans (Abs): 0.1 10*3/uL (ref 0.0–0.1)
Immature Granulocytes: 1 %
Lymphocytes Absolute: 2.7 10*3/uL (ref 0.7–3.1)
Lymphs: 32 %
MCH: 33.1 pg — ABNORMAL HIGH (ref 26.6–33.0)
MCHC: 35.3 g/dL (ref 31.5–35.7)
MCV: 94 fL (ref 79–97)
Monocytes Absolute: 0.8 10*3/uL (ref 0.1–0.9)
Monocytes: 9 %
Neutrophils Absolute: 4.6 10*3/uL (ref 1.4–7.0)
Neutrophils: 53 %
Platelets: 310 10*3/uL (ref 150–450)
RBC: 4.86 x10E6/uL (ref 3.77–5.28)
RDW: 12.4 % (ref 11.7–15.4)
WBC: 8.5 10*3/uL (ref 3.4–10.8)

## 2019-11-06 LAB — LIPID PANEL
Chol/HDL Ratio: 3 ratio (ref 0.0–4.4)
Cholesterol, Total: 181 mg/dL (ref 100–199)
HDL: 61 mg/dL (ref >39)
LDL Chol Calc (NIH): 101 mg/dL — ABNORMAL HIGH (ref 0–99)
Triglycerides: 106 mg/dL (ref 0–149)
VLDL Cholesterol Cal: 19 mg/dL (ref 5–40)

## 2019-11-06 LAB — VITAMIN D 25 HYDROXY (VIT D DEFICIENCY, FRACTURES): Vit D, 25-Hydroxy: 30.2 ng/mL (ref 30.0–100.0)

## 2019-11-06 LAB — VITAMIN B12: Vitamin B-12: 312 pg/mL (ref 232–1245)

## 2019-11-06 LAB — TSH: TSH: 1.83 u[IU]/mL (ref 0.450–4.500)

## 2019-11-07 ENCOUNTER — Encounter: Payer: Self-pay | Admitting: Family Medicine

## 2019-11-07 LAB — URINE CULTURE

## 2019-11-07 LAB — H. PYLORI BREATH TEST: H pylori Breath Test: NEGATIVE

## 2019-11-10 ENCOUNTER — Telehealth: Payer: Self-pay | Admitting: Family Medicine

## 2019-11-10 NOTE — Progress Notes (Signed)
PT lab results was mailed to her because she needs a person who does sign language.

## 2019-11-11 ENCOUNTER — Telehealth: Payer: Self-pay | Admitting: Family Medicine

## 2019-11-11 NOTE — Telephone Encounter (Signed)
Done

## 2019-11-11 NOTE — Telephone Encounter (Signed)
I also put pt lab results in the mail to be mailed on 11/10/19.

## 2019-11-18 NOTE — Telephone Encounter (Signed)
Sent to NP CMA

## 2019-11-19 ENCOUNTER — Ambulatory Visit: Payer: Medicare HMO | Admitting: Family Medicine

## 2019-12-01 ENCOUNTER — Ambulatory Visit: Payer: Medicare HMO | Admitting: Family Medicine

## 2019-12-28 ENCOUNTER — Ambulatory Visit (INDEPENDENT_AMBULATORY_CARE_PROVIDER_SITE_OTHER): Payer: Medicare HMO | Admitting: Family Medicine

## 2019-12-28 ENCOUNTER — Other Ambulatory Visit: Payer: Self-pay | Admitting: Family Medicine

## 2019-12-28 ENCOUNTER — Encounter: Payer: Self-pay | Admitting: Family Medicine

## 2019-12-28 ENCOUNTER — Other Ambulatory Visit: Payer: Self-pay

## 2019-12-28 VITALS — BP 137/87 | HR 70 | Temp 97.7°F | Resp 17 | Ht 59.0 in | Wt 178.4 lb

## 2019-12-28 DIAGNOSIS — F419 Anxiety disorder, unspecified: Secondary | ICD-10-CM | POA: Diagnosis not present

## 2019-12-28 DIAGNOSIS — Z0000 Encounter for general adult medical examination without abnormal findings: Secondary | ICD-10-CM | POA: Diagnosis not present

## 2019-12-28 DIAGNOSIS — I10 Essential (primary) hypertension: Secondary | ICD-10-CM | POA: Diagnosis not present

## 2019-12-28 DIAGNOSIS — Z758 Other problems related to medical facilities and other health care: Secondary | ICD-10-CM

## 2019-12-28 DIAGNOSIS — Z23 Encounter for immunization: Secondary | ICD-10-CM

## 2019-12-28 DIAGNOSIS — H913 Deaf nonspeaking, not elsewhere classified: Secondary | ICD-10-CM

## 2019-12-28 DIAGNOSIS — R600 Localized edema: Secondary | ICD-10-CM | POA: Diagnosis not present

## 2019-12-28 DIAGNOSIS — G8929 Other chronic pain: Secondary | ICD-10-CM

## 2019-12-28 DIAGNOSIS — Z01419 Encounter for gynecological examination (general) (routine) without abnormal findings: Secondary | ICD-10-CM

## 2019-12-28 DIAGNOSIS — R609 Edema, unspecified: Secondary | ICD-10-CM

## 2019-12-28 DIAGNOSIS — Z124 Encounter for screening for malignant neoplasm of cervix: Secondary | ICD-10-CM | POA: Diagnosis not present

## 2019-12-28 DIAGNOSIS — M25569 Pain in unspecified knee: Secondary | ICD-10-CM | POA: Diagnosis not present

## 2019-12-28 DIAGNOSIS — K219 Gastro-esophageal reflux disease without esophagitis: Secondary | ICD-10-CM | POA: Diagnosis not present

## 2019-12-28 DIAGNOSIS — Z789 Other specified health status: Secondary | ICD-10-CM

## 2019-12-28 DIAGNOSIS — Z603 Acculturation difficulty: Secondary | ICD-10-CM

## 2019-12-28 DIAGNOSIS — Z09 Encounter for follow-up examination after completed treatment for conditions other than malignant neoplasm: Secondary | ICD-10-CM

## 2019-12-28 MED ORDER — CITALOPRAM HYDROBROMIDE 20 MG PO TABS: 20.0000 mg | ORAL_TABLET | Freq: Every day | ORAL | 3 refills | Status: DC

## 2019-12-28 MED ORDER — CELECOXIB 200 MG PO CAPS: 200.0000 mg | ORAL_CAPSULE | Freq: Two times a day (BID) | ORAL | 3 refills | Status: DC

## 2019-12-28 MED ORDER — CYCLOBENZAPRINE HCL 10 MG PO TABS: 10.0000 mg | ORAL_TABLET | Freq: Three times a day (TID) | ORAL | 6 refills | Status: DC | PRN

## 2019-12-28 MED ORDER — HYDROCHLOROTHIAZIDE 25 MG PO TABS: 25.0000 mg | ORAL_TABLET | Freq: Every day | ORAL | 3 refills | Status: DC

## 2019-12-28 MED ORDER — FUROSEMIDE 20 MG PO TABS: 20.0000 mg | ORAL_TABLET | Freq: Every day | ORAL | 3 refills | Status: DC

## 2019-12-28 MED ORDER — LISINOPRIL 10 MG PO TABS: 10.0000 mg | ORAL_TABLET | Freq: Every day | ORAL | 3 refills | Status: DC

## 2019-12-28 NOTE — Progress Notes (Signed)
[facility]   Pap Smear  Subjective:  Patient ID: Christy Price, female    DOB: 1970-03-22  Age: 49 y.o. MRN: [id number]  CC:  Chief Complaint  Patient presents with  . Follow-up    Pt states she needs medication for her depression. Pt states she hit her head and  she put ice on it. Pt states it happen 7/14-7/15-21. Pt states she notice a slight odor. Slight discharge. X2-3 wks. Pt stated she took some AZO    HPI Christy Price is a 49 year old female who presents for Pap Smear today.   Patient Active Problem List   Diagnosis Date Noted  . Language barrier 05/09/2019  . Chronic pain of right knee 05/09/2019  . 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 Peripheral edema 11/04/2018  . Essential hypertension 11/04/2018  . Rash 11/04/2018  . Pain in both lower legs 12/12/2016  . Blepharitis of upper and lower eyelids of both eyes 09/24/2016  . Keratoconjunctivitis sicca of both eyes not specified as Sjogren's 09/24/2016  . Presbyopia of both eyes 09/24/2016  . Vitreous floater, bilateral 09/24/2016  . 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 Nail fungus 03/30/2015  . Toe pain 03/30/2015  . Insomnia 12/19/2014  . Pedal edema 11/06/2014  . Depression 11/06/2014  . Genital herpes 11/06/2014  . Back pain 11/06/2014   Gynecological Exam  She reports past history of abnormal PAP. She does have a history of cervical cancer. She denies dysuria, or vaginal bleeding and vaginal irritations. Denies excoriation or odor. She recently had her mammogram. Denies prior abnormal breast cancer screenings. She denies fevers, chills, fatigue, recent infections, weight loss, and night sweats. She has not had any headaches, visual changes, dizziness, and falls. No chest pain, heart palpitations, cough and shortness of breath reported. Denies GI problems such as nausea, vomiting, diarrhea, and constipation. She has no reports of  blood in stools, dysuria and hematuria. Her anxiety is moderate today as r/t personal problems with her husband. She is taking all medications as prescribed. She denies pain today.   Past Medical History:  Diagnosis Date  . Cancer (HCC)     hx cervical cx   . Hypertension   . Meningitis     Past Surgical History:  Procedure Laterality Date  . SHUNT REPLACEMENT      Family History  Problem Relation Age of Onset  . Breast cancer Sister     Social History   Socioeconomic History  . Marital status: Married    Spouse name: Not on file  . Number of children: Not on file  . Years of education: Not on file  . Highest education level: Not on file  Occupational History  . Not on file  Tobacco Use  . Smoking status: Current Every Day Smoker    Packs/day: 1.00    Types: Cigarettes  . Smokeless tobacco: Never Used  Vaping Use  . Vaping Use: Never used  Substance and Sexual Activity  . Alcohol use: Yes    Alcohol/week: 0.0 standard drinks    Comment: Social  . Drug use: No  . Sexual activity: Not Currently  Other Topics Concern  . Not on file  Social History Narrative  . Not on file   Social Determinants of Health   Financial Resource Strain:   . Difficulty of Paying Living Expenses: Not on file  Food Insecurity:   . Worried About [employer] of  Food in the Last Year: Not on file  . Ran Out of Food in the Last Year: Not on file  Transportation Needs:   . Lack of Transportation (Medical): Not on file  . Lack of Transportation (Non-Medical): Not on file  Physical Activity:   . Days of Exercise per Week: Not on file  . Minutes of Exercise per Session: Not on file  Stress:   . Feeling of Stress : Not on file  Social Connections:   . Frequency of Communication with Friends and Family: Not on file  . Frequency of Social Gatherings with Friends and Family: Not on file  . Attends Religious Services: Not on file  . Active Member of Clubs or Organizations: Not on file  .  Attends Archivist Meetings: Not on file  . Marital Status: Not on file  Intimate Partner Violence:   . Fear of Current or Ex-Partner: Not on file  . Emotionally Abused: Not on file  . Physically Abused: Not on file  . Sexually Abused: Not on file    Outpatient Medications Prior to Visit  Medication Sig Dispense Refill  . acyclovir (ZOVIRAX) 200 MG capsule Take 2 capsules (400 mg total) by mouth 2 (two) times daily. 60 capsule 6  . Hydrocortisone Acetate 1 % CREA Apply 28 g topically 2 (two) times daily as needed. 28 g 3  . pantoprazole (PROTONIX) 40 MG tablet Take 1 tablet (40 mg total) by mouth daily. (Patient not taking: Reported on 11/05/2019) 90 tablet 2  . potassium chloride (KLOR-CON M10) 10 MEQ tablet Take 1 tablet (10 mEq total) by mouth daily. (Patient not taking: Reported on 11/05/2019) 90 tablet 0  . terbinafine (LAMISIL) 250 MG tablet Take 1 tablet (250 mg total) by mouth daily. 90 tablet 1  . Vitamin D, Cholecalciferol, 400 units CAPS Take 400 mg by mouth.    . celecoxib (CELEBREX) 200 MG capsule Take 1 capsule (200 mg total) by mouth 2 (two) times daily. 60 capsule 2  . citalopram (CELEXA) 20 MG tablet Take 1 tablet (20 mg total) by mouth daily. 90 tablet 1  . cyclobenzaprine (FLEXERIL) 10 MG tablet Take 1 tablet (10 mg total) by mouth 3 (three) times daily as needed for muscle spasms. 30 tablet 3  . furosemide (LASIX) 20 MG tablet TAKE 1 TABLET BY MOUTH EVERY DAY 90 tablet 1  . hydrochlorothiazide (HYDRODIURIL) 25 MG tablet TAKE 1 TABLET BY MOUTH EVERY DAY 90 tablet 1  . lisinopril (ZESTRIL) 10 MG tablet Take 1 tablet (10 mg total) by mouth daily. 90 tablet 2  . nabumetone (RELAFEN) 750 MG tablet Take 1 tablet (750 mg total) by mouth 2 (two) times daily as needed. (Patient not taking: Reported on 11/05/2019) 60 tablet 2  . naproxen (NAPROSYN) 500 MG tablet Take 1 tablet (500 mg total) by mouth 2 (two) times daily with a meal. (Patient not taking: Reported on 11/05/2019)  30 tablet 1   No facility-administered medications prior to visit.    Allergies  Allergen Reactions  . Oxycodone   . Rifampin     ROS Review of Systems  Constitutional: Negative.   HENT: Negative.   Eyes: Negative.   Respiratory: Negative.   Cardiovascular: Negative.   Gastrointestinal: Positive for abdominal distention (obese).  Endocrine: Negative.   Genitourinary: Negative.   Musculoskeletal: Positive for arthralgias (generalized).  Skin: Negative.   Allergic/Immunologic: Negative.   Neurological: Positive for dizziness (occasional ) and headaches (occasional ).  Hematological: Negative.  Psychiatric/Behavioral: Negative.    Objective:    Physical Exam Vitals and nursing note reviewed.  Constitutional:      Appearance: Normal appearance.  HENT:     Head: Normocephalic and atraumatic.     Nose: Nose normal.     Mouth/Throat:     Mouth: Mucous membranes are dry.     Pharynx: Oropharynx is clear.  Cardiovascular:     Rate and Rhythm: Normal rate and regular rhythm.     Pulses: Normal pulses.     Heart sounds: Normal heart sounds.  Pulmonary:     Effort: Pulmonary effort is normal.     Breath sounds: Normal breath sounds.  Abdominal:     General: Bowel sounds are normal. There is distension (obese).     Palpations: Abdomen is soft.  Musculoskeletal:        General: Normal range of motion.     Cervical back: Normal range of motion and neck supple.  Skin:    General: Skin is warm and dry.  Neurological:     General: No focal deficit present.     Mental Status: She is alert and oriented to person, place, and time.  Psychiatric:        Mood and Affect: Mood normal.        Behavior: Behavior normal.        Thought Content: Thought content normal.        Judgment: Judgment normal.    BP 137/87 (BP Location: Left Arm, Patient Position: Sitting, Cuff Size: Large)   Pulse 70   Temp 97.7 F (36.5 C)   Resp 17   Ht 4\' 11"  (1.499 m)   Wt 178 lb 6.4 oz (80.9  kg)   SpO2 99%   BMI 36.03 kg/m  Wt Readings from Last 3 Encounters:  12/28/19 178 lb 6.4 oz (80.9 kg)  11/05/19 178 lb (80.7 kg)  05/07/19 178 lb 9.6 oz (81 kg)     Health Maintenance Due  Topic Date Due  . Hepatitis C Screening  Never done  . HIV Screening  Never done  . COLONOSCOPY  Never done    There are no preventive care reminders to display for this patient.  Lab Results  Component Value Date   TSH 1.830 11/05/2019   Lab Results  Component Value Date   WBC 8.5 11/05/2019   HGB 16.1 (H) 11/05/2019   HCT 45.6 11/05/2019   MCV 94 11/05/2019   PLT 310 11/05/2019   Lab Results  Component Value Date   NA 139 11/05/2019   K 3.9 11/05/2019   CO2 24 11/05/2019   GLUCOSE 84 11/05/2019   BUN 15 11/05/2019   CREATININE 0.73 11/05/2019   BILITOT <0.2 11/05/2019   ALKPHOS 88 11/05/2019   AST 18 11/05/2019   ALT 12 11/05/2019   PROT 6.9 11/05/2019   ALBUMIN 4.7 11/05/2019   CALCIUM 9.7 11/05/2019   Lab Results  Component Value Date   CHOL 181 11/05/2019   Lab Results  Component Value Date   HDL 61 11/05/2019   Lab Results  Component Value Date   LDLCALC 101 (H) 11/05/2019   Lab Results  Component Value Date   TRIG 106 11/05/2019   Lab Results  Component Value Date   CHOLHDL 3.0 11/05/2019   Lab Results  Component Value Date   HGBA1C 4.8 05/07/2019      Assessment & Plan:   1. Encounter for cervical Pap smear with pelvic exam s Tolerated procedure  well with no complaints.  - IGP, rfx Aptima HPV ASCU  2. Nonspeaking deaf Interpreter present today.   3. Language barrier Deaf  4. Chronic knee pain, unspecified laterality - celecoxib (CELEBREX) 200 MG capsule; Take 1 capsule (200 mg total) by mouth 2 (two) times daily.  Dispense: 180 capsule; Refill: 3 - cyclobenzaprine (FLEXERIL) 10 MG tablet; Take 1 tablet (10 mg total) by mouth 3 (three) times daily as needed for muscle spasms.  Dispense: 30 tablet; Refill: 6  5. Peripheral edema -  furosemide (LASIX) 20 MG tablet; Take 1 tablet (20 mg total) by mouth daily.  Dispense: 90 tablet; Refill: 3  6. Bilatral lower extremity edema - furosemide (LASIX) 20 MG tablet; Take 1 tablet (20 mg total) by mouth daily.  Dispense: 90 tablet; Refill: 3  7. Essential hypertension The current medical regimen is effective; She is stable at 137/87 today; continue present plan and medications as prescribed. She will continue to take medications as prescribed, to decrease high sodium intake, excessive alcohol intake, increase potassium intake, smoking cessation, and increase physical activity of at least 30 minutes of cardio activity daily. She will continue to follow Heart Healthy or DASH diet. - hydrochlorothiazide (HYDRODIURIL) 25 MG tablet; Take 1 tablet (25 mg total) by mouth daily.  Dispense: 90 tablet; Refill: 3 - lisinopril (ZESTRIL) 10 MG tablet; Take 1 tablet (10 mg total) by mouth aily.  Dispense: 90 tablet; Refill: 3  8. Gastroesophageal reflux disease without esophagitis  9. Anxiety - citalopram (CELEXA) 20 MG tablet; Take 1 tablet (20 mg total) by mouth daily.  Dispense: 90 tablet; Refill: 3  10. Healthcare maintenance - Flu Vaccine QUAD 6+ mos PF IM (Fluarix Quad PF)  11. Follow up She will follow up in 4 months.   Meds ordered this encounter  Medications  . celecoxib (CELEBREX) 200 MG capsule    Sig: Take 1 capsule (200 mg total) by mouth 2 (two) times daily.    Dispense:  180 capsule    Refill:  3  . citalopram (CELEXA) 20 MG tablet    Sig: Take 1 tablet (20 mg total) by mouth daily.    Dispense:  90 tablet    Refill:  3  . cyclobenzaprine (FLEXERIL) 10 MG tablet    Sig: Take 1 tablet (10 mg total) by mouth 3 (three) times daily as needed for muscle spasms.    Dispense:  30 tablet    Refill:  6  . furosemide (LASIX) 20 MG tablet    Sig: Take 1 tablet (20 mg total) by mouth daily.    Dispense:  90 tablet    Refill:  3  . hydrochlorothiazide (HYDRODIURIL) 25 MG  tablet    Sig: Take 1 tablet (25 mg total) by mouth daily.    Dispense:  90 tablet    Refill:  3  . lisinopril (ZESTRIL) 10 MG tablet    Sig: Take 1 tablet (10 mg total) by mouth daily.    Dispense:  90 tablet    Refill:  3    Orders Placed This Encounter  Procedures  . Flu Vaccine QUAD 6+ mos PF IM (Fluarix Quad PF)    Referral Orders  No referral(s) requested today    Kathe Becton,  MSN, FNP-BC [facility] [address] [place], [place] [address] [phone] fax   Problem List Items Addressed This Visit      Cardiovascular and Mediastinum  Essential hypertension   Relevant Medications   furosemide (LASIX) 20 MG tablet   hydrochlorothiazide (HYDRODIURIL) 25 MG tablet   lisinopril (ZESTRIL) 10 MG tablet     Nervous and Auditory   Nonspeaking deaf     Other   Language barrier   Peripheral edema   Relevant Medications   furosemide (LASIX) 20 MG tablet    Other Visit Diagnoses    Cervical cancer screening    -  Primary   Relevant Orders   IGP, rfx Aptima HPV ASCU   Chronic knee pain, unspecified laterality       Relevant Medications   celecoxib (CELEBREX) 200 MG capsule   citalopram (CELEXA) 20 MG tablet   cyclobenzaprine (FLEXERIL) 10 MG tablet   Bilateral lower extremity edema       Relevant Medications   furosemide (LASIX) 20 MG tablet   Gastroesophageal reflux disease without esophagitis       Anxiety       Relevant Medications   citalopram (CELEXA) 20 MG tablet   Healthcare maintenance       Relevant Orders   Flu Vaccine QUAD 6+ mos PF IM (Fluarix Quad PF) (Completed)   Follow up          Meds ordered this encounter  Medications  . celecoxib (CELEBREX) 200 MG capsule    Sig: Take 1 capsule (200 mg total) by mouth 2 (two) times daily.    Dispense:  180 capsule    Refill:  3  . citalopram (CELEXA) 20 MG tablet    Sig: Take 1 tablet (20 mg  total) by mouth daily.    Dispense:  90 tablet    Refill:  3  . cyclobenzaprine (FLEXERIL) 10 MG tablet    Sig: Take 1 tablet (10 mg total) by mouth 3 (three) times daily as needed for muscle spasms.    Dispense:  30 tablet    Refill:  6  . furosemide (LASIX) 20 MG tablet    Sig: Take 1 tablet (20 mg total) by mouth daily.    Dispense:  90 tablet    Refill:  3  . hydrochlorothiazide (HYDRODIURIL) 25 MG tablet    Sig: Take 1 tablet (25 mg total) by mouth daily.    Dispense:  90 tablet    Refill:  3  . lisinopril (ZESTRIL) 10 MG tablet    Sig: Take 1 tablet (10 mg total) by mouth daily.    Dispense:  90 tablet    Refill:  3    Follow-up: No follow-ups on file.    Azzie Glatter, FNP

## 2019-12-30 LAB — IGP, RFX APTIMA HPV ASCU

## 2020-01-02 ENCOUNTER — Encounter: Payer: Self-pay | Admitting: Family Medicine

## 2020-05-08 ENCOUNTER — Telehealth: Payer: Self-pay | Admitting: Family Medicine

## 2020-05-08 ENCOUNTER — Ambulatory Visit: Payer: Medicare HMO | Admitting: Family Medicine

## 2020-05-08 NOTE — Telephone Encounter (Signed)
Pt called to say she forgot about appt today with PCP. Pt states she has covid but it is a mild case. Pt will call later when better to reschedule.

## 2020-05-18 ENCOUNTER — Other Ambulatory Visit: Payer: Self-pay | Admitting: Family Medicine

## 2020-05-18 DIAGNOSIS — Z1231 Encounter for screening mammogram for malignant neoplasm of breast: Secondary | ICD-10-CM

## 2020-06-12 ENCOUNTER — Encounter: Payer: Self-pay | Admitting: Family Medicine

## 2020-06-12 ENCOUNTER — Other Ambulatory Visit: Payer: Self-pay

## 2020-06-12 ENCOUNTER — Ambulatory Visit (INDEPENDENT_AMBULATORY_CARE_PROVIDER_SITE_OTHER): Payer: Medicare HMO | Admitting: Family Medicine

## 2020-06-12 VITALS — BP 153/61 | HR 60 | Ht 59.0 in | Wt 175.0 lb

## 2020-06-12 DIAGNOSIS — B351 Tinea unguium: Secondary | ICD-10-CM

## 2020-06-12 DIAGNOSIS — Z789 Other specified health status: Secondary | ICD-10-CM | POA: Diagnosis not present

## 2020-06-12 DIAGNOSIS — Z9189 Other specified personal risk factors, not elsewhere classified: Secondary | ICD-10-CM | POA: Diagnosis not present

## 2020-06-12 DIAGNOSIS — N898 Other specified noninflammatory disorders of vagina: Secondary | ICD-10-CM | POA: Diagnosis not present

## 2020-06-12 DIAGNOSIS — H913 Deaf nonspeaking, not elsewhere classified: Secondary | ICD-10-CM | POA: Diagnosis not present

## 2020-06-12 DIAGNOSIS — Z758 Other problems related to medical facilities and other health care: Secondary | ICD-10-CM

## 2020-06-12 DIAGNOSIS — I10 Essential (primary) hypertension: Secondary | ICD-10-CM | POA: Diagnosis not present

## 2020-06-12 DIAGNOSIS — Z09 Encounter for follow-up examination after completed treatment for conditions other than malignant neoplasm: Secondary | ICD-10-CM

## 2020-06-12 LAB — POCT URINALYSIS DIPSTICK
Bilirubin, UA: NEGATIVE
Glucose, UA: NEGATIVE
Ketones, UA: NEGATIVE
Leukocytes, UA: NEGATIVE
Nitrite, UA: NEGATIVE
Protein, UA: NEGATIVE
Spec Grav, UA: 1.02 (ref 1.010–1.025)
Urobilinogen, UA: 0.2 E.U./dL
pH, UA: 7 (ref 5.0–8.0)

## 2020-06-12 MED ORDER — TERBINAFINE HCL 250 MG PO TABS: 250.0000 mg | ORAL_TABLET | Freq: Every day | ORAL | 1 refills | Status: DC

## 2020-06-12 NOTE — Progress Notes (Signed)
Patient [facility]    Established Patient Office Visit  Subjective:  Patient ID: Christy Price, female    DOB: 1970-09-13  Age: 50 y.o. MRN: [id number]  CC:  Chief Complaint  Patient presents with  . Hypertension    HPI Christy Price is a 50 year old female who presents for Follow Up today.   Patient Active Problem List   Diagnosis Date Noted  . Language barrier 05/09/2019  . Chronic pain of right knee 05/09/2019  . 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 Peripheral edema 11/04/2018  . Essential hypertension 11/04/2018  . Rash 11/04/2018  . Pain in both lower legs 12/12/2016  . Blepharitis of upper and lower eyelids of both eyes 09/24/2016  . Keratoconjunctivitis sicca of both eyes not specified as Sjogren's 09/24/2016  . Presbyopia of both eyes 09/24/2016  . Vitreous floater, bilateral 09/24/2016  . 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 Nail fungus 03/30/2015  . Toe pain 03/30/2015  . Insomnia 12/19/2014  . Pedal edema 11/06/2014  . Depression 11/06/2014  . Genital herpes 11/06/2014  . Back pain 11/06/2014   Current Status: Since her last office visit, she has c/o of vaginal odor and discharge. She has not used any medication for symptoms. She denies urinary frequency, dysuria, urinary itching, burning, hematuria, and suprapubic pain/discomfort. She is doing well with no complaints.Patient is deaf and is accompanied by an interpreter today. She denies visual changes, chest pain, cough, shortness of breath, heart palpitations, and falls. She has occasional headaches and dizziness with position changes. Denies severe headaches, confusion, seizures, double vision, and blurred vision, nausea and vomiting.   She denies fevers, chills, fatigue, recent infections, weight loss, and night sweats. Denies GI problems such as nausea, vomiting, diarrhea, and constipation. She has no reports of blood in stools,  dysuria and hematuria. No depression or anxiety reported today. She is taking all medications as prescribed. She denies pain today.   Past Medical History:  Diagnosis Date  . Cancer (HCC)     hx cervical cx   . Hypertension   . Meningitis     Past Surgical History:  Procedure Laterality Date  . SHUNT REPLACEMENT      Family History  Problem Relation Age of Onset  . Breast cancer Sister     Social History   Socioeconomic History  . Marital status: Married    Spouse name: Not on file  . Number of children: Not on file  . Years of education: Not on file  . Highest education level: Not on file  Occupational History  . Not on file  Tobacco Use  . Smoking status: Current Every Day Smoker    Packs/day: 1.00    Types: Cigarettes  . Smokeless tobacco: Never Used  Vaping Use  . Vaping Use: Never used  Substance and Sexual Activity  . Alcohol use: Yes    Alcohol/week: 0.0 standard drinks    Comment: Social  . Drug use: No  . Sexual activity: Not Currently  Other Topics Concern  . Not on file  Social History Narrative  . Not on file   Social Determinants of Health   Financial Resource Strain: Not on file  Food Insecurity: Not on file  Transportation Needs: Not on file  Physical Activity: Not on file  Stress: Not on file  Social Connections: Not on file  Intimate Partner Violence: Not on file    Outpatient Medications Prior to Visit  Medication Sig Dispense Refill  . acyclovir (ZOVIRAX) 200 MG capsule Take 2 capsules (400 mg total) by mouth 2 (two) times daily. 60 capsule 6  . celecoxib (CELEBREX) 200 MG capsule Take 1 capsule (200 mg total) by mouth 2 (two) times daily. 180 capsule 3  . Cholecalciferol (D3 PO) Take by mouth.    . cyclobenzaprine (FLEXERIL) 10 MG tablet Take 1 tablet (10 mg total) by mouth 3 (three) times daily as needed for muscle spasms. 30 tablet 6  . furosemide (LASIX) 20 MG tablet Take 1 tablet (20 mg total) by mouth daily. 90 tablet 3  .  hydrochlorothiazide (HYDRODIURIL) 25 MG tablet Take 1 tablet (25 mg total) by mouth daily. 90 tablet 3  . Hydrocortisone Acetate 1 % CREA Apply 28 g topically 2 (two) times daily as needed. 28 g 3  . lisinopril (ZESTRIL) 10 MG tablet Take 1 tablet (10 mg total) by mouth daily. 90 tablet 3  . citalopram (CELEXA) 20 MG tablet Take 1 tablet (20 mg total) by mouth daily. 90 tablet 3  . terbinafine (LAMISIL) 250 MG tablet Take 1 tablet (250 mg total) by mouth daily. 90 tablet 1  . pantoprazole (PROTONIX) 40 MG tablet Take 1 tablet (40 mg total) by mouth daily. (Patient not taking: Reported on 11/05/2019) 90 tablet 2  . potassium chloride (KLOR-CON M10) 10 MEQ tablet Take 1 tablet (10 mEq total) by mouth daily. (Patient not taking: Reported on 11/05/2019) 90 tablet 0  . Vitamin D, Cholecalciferol, 400 units CAPS Take 400 mg by mouth.     No facility-administered medications prior to visit.    Allergies  Allergen Reactions  . Oxycodone   . Rifampin     ROS Review of Systems  Constitutional: Negative.   HENT: Negative.   Eyes: Negative.   Respiratory: Negative.   Cardiovascular: Negative.   Gastrointestinal: Positive for abdominal distention (obese).  Endocrine: Negative.   Genitourinary: Negative.   Musculoskeletal: Positive for arthralgias (generalized joint pain).  Skin: Negative.   Allergic/Immunologic: Negative.   Neurological: Positive for dizziness (occasional ) and headaches (occasional ).  Hematological: Negative.   Psychiatric/Behavioral: Negative.       Objective:    Physical Exam Vitals and nursing note reviewed.  Constitutional:      Appearance: Normal appearance.  HENT:     Head: Normocephalic and atraumatic.     Nose: Nose normal.     Mouth/Throat:     Mouth: Mucous membranes are moist.     Pharynx: Oropharynx is clear.  Cardiovascular:     Rate and Rhythm: Normal rate and regular rhythm.     Pulses: Normal pulses.     Heart sounds: Normal heart sounds.   Pulmonary:     Effort: Pulmonary effort is normal.  Abdominal:     General: Bowel sounds are normal.     Palpations: Abdomen is soft.  Musculoskeletal:        General: Swelling present.     Cervical back: Normal range of motion.     Right lower leg: Edema (1+) present.     Left lower leg: Edema (1+) present.  Skin:    General: Skin is warm and dry.  Neurological:     General: No focal deficit present.     Mental Status: She is alert and oriented to person, place, and time.  Psychiatric:        Mood and Affect: Mood normal.        Behavior: Behavior normal.  Thought Content: Thought content normal.        Judgment: Judgment normal.     BP (!) 153/61   Pulse 60   Ht 4\' 11"  (1.499 m)   Wt 175 lb (79.4 kg)   SpO2 99%   BMI 35.35 kg/m  Wt Readings from Last 3 Encounters:  06/12/20 175 lb (79.4 kg)  12/28/19 178 lb 6.4 oz (80.9 kg)  11/05/19 178 lb (80.7 kg)     Health Maintenance Due  Topic Date Due  . Hepatitis C Screening  Never done  . HIV Screening  Never done  . COLONOSCOPY (Pts 45-67yrs Insurance coverage will need to be confirmed)  Never done    There are no preventive care reminders to display for this patient.  Lab Results  Component Value Date   TSH 1.830 11/05/2019   Lab Results  Component Value Date   WBC 8.5 11/05/2019   HGB 16.1 (H) 11/05/2019   HCT 45.6 11/05/2019   MCV 94 11/05/2019   PLT 310 11/05/2019   Lab Results  Component Value Date   NA 139 11/05/2019   K 3.9 11/05/2019   CO2 24 11/05/2019   GLUCOSE 84 11/05/2019   BUN 15 11/05/2019   CREATININE 0.73 11/05/2019   BILITOT <0.2 11/05/2019   ALKPHOS 88 11/05/2019   AST 18 11/05/2019   ALT 12 11/05/2019   PROT 6.9 11/05/2019   ALBUMIN 4.7 11/05/2019   CALCIUM 9.7 11/05/2019   Lab Results  Component Value Date   CHOL 181 11/05/2019   Lab Results  Component Value Date   HDL 61 11/05/2019   Lab Results  Component Value Date   LDLCALC 101 (H) 11/05/2019   Lab  Results  Component Value Date   TRIG 106 11/05/2019   Lab Results  Component Value Date   CHOLHDL 3.0 11/05/2019   Lab Results  Component Value Date   HGBA1C 4.8 05/07/2019      Assessment & Plan:   1. Language barrier  2. Nonspeaking deaf  3. Essential hypertension She will continue to take medications as prescribed, to decrease high sodium intake, excessive alcohol intake, increase potassium intake, smoking cessation, and increase physical activity of at least 30 minutes of cardio activity daily. She will continue to follow Heart Healthy or DASH diet. - POCT urinalysis dipstick  4. Toenail fungus - terbinafine (LAMISIL) 250 MG tablet; Take 1 tablet (250 mg total) by mouth daily.  Dispense: 90 tablet; Refill: 1  5. At risk for foot problem - Ambulatory referral to Podiatry  6. Vaginal discharge - NuSwab Vaginitis Plus (VG+)  7. Vaginal odor - NuSwab Vaginitis Plus (VG+)  8. Follow up She will follow up 10/2020 for Annual Physical and Labwork.   Meds ordered this encounter  Medications  . terbinafine (LAMISIL) 250 MG tablet    Sig: Take 1 tablet (250 mg total) by mouth daily.    Dispense:  90 tablet    Refill:  1    Orders Placed This Encounter  Procedures  . NuSwab Vaginitis Plus (VG+)  . Ambulatory referral to Podiatry  . POCT urinalysis dipstick     Referral Orders     Ambulatory referral to [place], MSN, ANE, FNP-BC [facility] [place] [address] [place], [place] [address] [phone] fax    Problem List Items Addressed This Visit      Cardiovascular and Mediastinum   Essential hypertension -  Primary   Relevant Orders   POCT urinalysis dipstick (Completed)     Nervous and Auditory   Nonspeaking deaf     Other   Language barrier    Other Visit Diagnoses    Toenail fungus       Relevant Medications   terbinafine  (LAMISIL) 250 MG tablet   At risk for foot problem       Relevant Orders   Ambulatory referral to Podiatry   Vaginal discharge       Relevant Orders   NuSwab Vaginitis Plus (VG+) (Completed)   Vaginal odor       Relevant Orders   NuSwab Vaginitis Plus (VG+) (Completed)   Follow up          Meds ordered this encounter  Medications  . terbinafine (LAMISIL) 250 MG tablet    Sig: Take 1 tablet (250 mg total) by mouth daily.    Dispense:  90 tablet    Refill:  1    Follow-up: No follow-ups on file.    Azzie Glatter, FNP

## 2020-06-14 ENCOUNTER — Telehealth: Payer: Self-pay

## 2020-06-14 ENCOUNTER — Encounter: Payer: Self-pay | Admitting: Family Medicine

## 2020-06-14 LAB — NUSWAB VAGINITIS PLUS (VG+)
Candida albicans, NAA: NEGATIVE
Candida glabrata, NAA: NEGATIVE
Chlamydia trachomatis, NAA: NEGATIVE
Neisseria gonorrhoeae, NAA: NEGATIVE
Trich vag by NAA: NEGATIVE

## 2020-06-14 NOTE — Telephone Encounter (Signed)
Left message using sign language interpreter for patient to call back regarding negative results.

## 2020-06-14 NOTE — Telephone Encounter (Signed)
-----   Message from Azzie Glatter, FNP sent at 06/14/2020  2:07 PM EDT ----- All results are negative. Please inform patient or leave message via Deaf Interpreter. Thank you.

## 2020-06-14 NOTE — Telephone Encounter (Signed)
Patient aware of results.

## 2020-06-19 ENCOUNTER — Ambulatory Visit (INDEPENDENT_AMBULATORY_CARE_PROVIDER_SITE_OTHER): Payer: Medicare HMO

## 2020-06-19 ENCOUNTER — Other Ambulatory Visit: Payer: Self-pay

## 2020-06-19 ENCOUNTER — Encounter: Payer: Self-pay | Admitting: Podiatry

## 2020-06-19 ENCOUNTER — Other Ambulatory Visit: Payer: Self-pay | Admitting: Podiatry

## 2020-06-19 ENCOUNTER — Ambulatory Visit (INDEPENDENT_AMBULATORY_CARE_PROVIDER_SITE_OTHER): Payer: Medicare HMO | Admitting: Podiatry

## 2020-06-19 DIAGNOSIS — M79672 Pain in left foot: Secondary | ICD-10-CM

## 2020-06-19 DIAGNOSIS — M7661 Achilles tendinitis, right leg: Secondary | ICD-10-CM

## 2020-06-19 DIAGNOSIS — M7730 Calcaneal spur, unspecified foot: Secondary | ICD-10-CM

## 2020-06-19 DIAGNOSIS — M7662 Achilles tendinitis, left leg: Secondary | ICD-10-CM

## 2020-06-19 DIAGNOSIS — M79671 Pain in right foot: Secondary | ICD-10-CM

## 2020-06-19 MED ORDER — BETAMETHASONE SOD PHOS & ACET 6 (3-3) MG/ML IJ SUSP
3.0000 mg | Freq: Once | INTRAMUSCULAR | Status: AC
  Administered 2020-06-19: 3 mg via INTRA_ARTICULAR

## 2020-06-19 NOTE — Progress Notes (Signed)
   HPI: 50 y.o. female presenting today for evaluation of Achilles tendinitis to the bilateral lower extremities that is been going on for several years despite conservative treatment.  Patient states she has had injections in the past which only helped temporarily.  She has also gone to physical therapy with minimal relief.  Today she states that the pain on her right foot is worse than the left however it changes size and severity.  She presents for further treatment evaluation  Past Medical History:  Diagnosis Date  . Cancer (HCC)     hx cervical cx   . Hypertension   . Meningitis       Physical Exam: General: The patient is alert and oriented x3 in no acute distress.  Dermatology: Skin is warm, dry and supple bilateral lower extremities. Negative for open lesions or macerations.  Vascular: Palpable pedal pulses bilaterally. No edema or erythema noted. Capillary refill within normal limits.  Neurological: Epicritic and protective threshold grossly intact bilaterally.   Musculoskeletal Exam: Pain on palpation noted to the posterior tubercle of the bilateral calcaneus at the insertion of the Achilles tendon consistent with retrocalcaneal bursitis. Range of motion within normal limits. Muscle strength 5/5 in all muscle groups bilateral lower extremities.  Radiographic Exam:  Posterior calcaneal spur noted to the respective calcaneus on lateral view. No fracture or dislocation noted. Normal osseous mineralization noted.     Assessment: 1. Insertional Achilles tendinitis bilateral 2. Retrocalcaneal bursitis   Plan of Care:  1. Patient was evaluated. Radiographs were reviewed today. 2. Injection of 0.5 mL Celestone Soluspan injected into the retrocalcaneal bursa. Care was taken to avoid direct injection into the Achilles tendon. 3. Today we discussed the conservative versus surgical management of the presenting pathology. The patient opts for surgical management. All possible  complications and details of the procedure were explained. All patient questions were answered. No guarantees were expressed or implied.  The patient has now had this pain for several years despite conservative treatment modalities and I do believe the surgery would be warranted at this time.  3. Authorization for surgery was initiated today. Surgery will consist of retrocalcaneal exostectomy with repair of Achilles tendon right 4.  Medical clearance required prior to surgery 5.  Order placed for knee scooter for postop  6.  Return to clinic 1 week postop     Edrick Kins, DPM [facility]  Dr. Edrick Kins, [address]                                        [place], [place] [address]                Office [phone]  Fax [phone]

## 2020-06-21 ENCOUNTER — Other Ambulatory Visit: Payer: Self-pay | Admitting: Family Medicine

## 2020-06-21 ENCOUNTER — Ambulatory Visit: Payer: Medicare HMO | Admitting: Podiatry

## 2020-06-21 ENCOUNTER — Telehealth: Payer: Self-pay | Admitting: Urology

## 2020-06-21 NOTE — Telephone Encounter (Signed)
DOS - 07/20/20  CALCANEAL OSTECTOMY RIGHT --- 59292 REPAIR ACHILLES --- [id number]  [employer] EFFECTIVE DATE - 01/28/17  PER [employer] INSURANCE) FOR CPT CODES 63817 AND 71165 NO PRIOR AUTH IS REQUIRED.

## 2020-07-11 ENCOUNTER — Inpatient Hospital Stay: Admission: RE | Admit: 2020-07-11 | Payer: Medicare HMO | Source: Ambulatory Visit

## 2020-07-14 ENCOUNTER — Telehealth: Payer: Self-pay

## 2020-07-14 NOTE — Telephone Encounter (Signed)
Christy Price called to cancel her surgery with Dr. Amalia Hailey on 07/20/2020. She stated she wanted to wait till after summer and that she will call me back to get it rescheduled. I notified Dr. Amalia Hailey and Caren Griffins with [place]

## 2020-07-26 ENCOUNTER — Encounter: Payer: Medicare HMO | Admitting: Podiatry

## 2020-08-02 ENCOUNTER — Encounter: Payer: Medicare HMO | Admitting: Podiatry

## 2020-08-16 ENCOUNTER — Encounter: Payer: Medicare HMO | Admitting: Podiatry

## 2020-09-05 ENCOUNTER — Other Ambulatory Visit: Payer: Self-pay

## 2020-09-05 ENCOUNTER — Ambulatory Visit
Admission: RE | Admit: 2020-09-05 | Discharge: 2020-09-05 | Disposition: A | Discharge status: Home / Self Care | Payer: Medicare HMO | Source: Ambulatory Visit | Attending: Family Medicine | Admitting: Family Medicine

## 2020-09-05 DIAGNOSIS — Z1231 Encounter for screening mammogram for malignant neoplasm of breast: Secondary | ICD-10-CM | POA: Diagnosis not present

## 2020-10-23 ENCOUNTER — Ambulatory Visit (INDEPENDENT_AMBULATORY_CARE_PROVIDER_SITE_OTHER): Payer: Medicare HMO | Admitting: Nurse Practitioner

## 2020-10-23 ENCOUNTER — Other Ambulatory Visit: Payer: Self-pay

## 2020-10-23 ENCOUNTER — Encounter: Payer: Self-pay | Admitting: Nurse Practitioner

## 2020-10-23 VITALS — BP 148/72 | HR 76 | Temp 97.9°F | Ht 59.0 in | Wt 175.0 lb

## 2020-10-23 DIAGNOSIS — Z5320 Procedure and treatment not carried out because of patient's decision for unspecified reasons: Secondary | ICD-10-CM | POA: Diagnosis not present

## 2020-10-23 DIAGNOSIS — R82998 Other abnormal findings in urine: Secondary | ICD-10-CM

## 2020-10-23 DIAGNOSIS — B351 Tinea unguium: Secondary | ICD-10-CM | POA: Diagnosis not present

## 2020-10-23 DIAGNOSIS — Z114 Encounter for screening for human immunodeficiency virus [HIV]: Secondary | ICD-10-CM

## 2020-10-23 DIAGNOSIS — I10 Essential (primary) hypertension: Secondary | ICD-10-CM | POA: Diagnosis not present

## 2020-10-23 DIAGNOSIS — Z1211 Encounter for screening for malignant neoplasm of colon: Secondary | ICD-10-CM | POA: Diagnosis not present

## 2020-10-23 DIAGNOSIS — A6000 Herpesviral infection of urogenital system, unspecified: Secondary | ICD-10-CM

## 2020-10-23 DIAGNOSIS — N898 Other specified noninflammatory disorders of vagina: Secondary | ICD-10-CM

## 2020-10-23 DIAGNOSIS — Z1159 Encounter for screening for other viral diseases: Secondary | ICD-10-CM | POA: Diagnosis not present

## 2020-10-23 DIAGNOSIS — R609 Edema, unspecified: Secondary | ICD-10-CM

## 2020-10-23 DIAGNOSIS — G8929 Other chronic pain: Secondary | ICD-10-CM

## 2020-10-23 DIAGNOSIS — H913 Deaf nonspeaking, not elsewhere classified: Secondary | ICD-10-CM | POA: Diagnosis not present

## 2020-10-23 DIAGNOSIS — R600 Localized edema: Secondary | ICD-10-CM

## 2020-10-23 DIAGNOSIS — M25569 Pain in unspecified knee: Secondary | ICD-10-CM

## 2020-10-23 LAB — POCT URINALYSIS DIPSTICK
Bilirubin, UA: NEGATIVE
Glucose, UA: NEGATIVE
Ketones, UA: NEGATIVE
Nitrite, UA: NEGATIVE
Protein, UA: NEGATIVE
Spec Grav, UA: 1.015 (ref 1.010–1.025)
Urobilinogen, UA: 0.2 E.U./dL
pH, UA: 6.5 (ref 5.0–8.0)

## 2020-10-23 MED ORDER — CYCLOBENZAPRINE HCL 10 MG PO TABS: 10.0000 mg | ORAL_TABLET | Freq: Three times a day (TID) | ORAL | 6 refills | Status: AC | PRN

## 2020-10-23 MED ORDER — FUROSEMIDE 20 MG PO TABS: 20.0000 mg | ORAL_TABLET | Freq: Every day | ORAL | 3 refills | Status: DC

## 2020-10-23 MED ORDER — TERBINAFINE HCL 250 MG PO TABS: 250.0000 mg | ORAL_TABLET | Freq: Every day | ORAL | 1 refills | Status: DC

## 2020-10-23 MED ORDER — HYDROCHLOROTHIAZIDE 25 MG PO TABS: 25.0000 mg | ORAL_TABLET | Freq: Every day | ORAL | 3 refills | Status: DC

## 2020-10-23 MED ORDER — FLUCONAZOLE 150 MG PO TABS: 150.0000 mg | ORAL_TABLET | Freq: Once | ORAL | 0 refills | Status: AC

## 2020-10-23 NOTE — Patient Instructions (Signed)
Managing Your Hypertension Hypertension, also called high blood pressure, is when the force of the blood pressing against the walls of the arteries is too strong. Arteries are blood vessels that carry blood from your heart throughout your body. Hypertension forces the heart to work harder to pump blood and may cause the arteries tobecome narrow or stiff. Understanding blood pressure readings Your personal target blood pressure may vary depending on your medical conditions, your age, and other factors. A blood pressure reading includes a higher number over a lower number. Ideally, your blood pressure should be below 120/80. You should know that: The first, or top, number is called the systolic pressure. It is a measure of the pressure in your arteries as your heart beats. The second, or bottom number, is called the diastolic pressure. It is a measure of the pressure in your arteries as the heart relaxes. Blood pressure is classified into four stages. Based on your blood pressure reading, your health care provider may use the following stages to determine what type of treatment you need, if any. Systolic pressure and diastolicpressure are measured in a unit called mmHg. Normal Systolic pressure: below 120. Diastolic pressure: below 80. Elevated Systolic pressure: 120-129. Diastolic pressure: below 80. Hypertension stage 1 Systolic pressure: 130-139. Diastolic pressure: 80-89. Hypertension stage 2 Systolic pressure: 140 or above. Diastolic pressure: 90 or above. How can this condition affect me? Managing your hypertension is an important responsibility. Over time, hypertension can damage the arteries and decrease blood flow to important parts of the body, including the brain, heart, and kidneys. Having untreated or uncontrolled hypertension can lead to: A heart attack. A stroke. A weakened blood vessel (aneurysm). Heart failure. Kidney damage. Eye damage. Metabolic syndrome. Memory and  concentration problems. Vascular dementia. What actions can I take to manage this condition? Hypertension can be managed by making lifestyle changes and possibly by taking medicines. Your health care provider will help you make a plan to bring yourblood pressure within a normal range. Nutrition  Eat a diet that is high in fiber and potassium, and low in salt (sodium), added sugar, and fat. An example eating plan is called the Dietary Approaches to Stop Hypertension (DASH) diet. To eat this way: Eat plenty of fresh fruits and vegetables. Try to fill one-half of your plate at each meal with fruits and vegetables. Eat whole grains, such as whole-wheat pasta, brown rice, or whole-grain bread. Fill about one-fourth of your plate with whole grains. Eat low-fat dairy products. Avoid fatty cuts of meat, processed or cured meats, and poultry with skin. Fill about one-fourth of your plate with lean proteins such as fish, chicken without skin, beans, eggs, and tofu. Avoid pre-made and processed foods. These tend to be higher in sodium, added sugar, and fat. Reduce your daily sodium intake. Most people with hypertension should eat less than 1,500 mg of sodium a day.  Lifestyle  Work with your health care provider to maintain a healthy body weight or to lose weight. Ask what an ideal weight is for you. Get at least 30 minutes of exercise that causes your heart to beat faster (aerobic exercise) most days of the week. Activities may include walking, swimming, or biking. Include exercise to strengthen your muscles (resistance exercise), such as weight lifting, as part of your weekly exercise routine. Try to do these types of exercises for 30 minutes at least 3 days a week. Do not use any products that contain nicotine or tobacco, such as cigarettes, e-cigarettes, and chewing   tobacco. If you need help quitting, ask your health care provider. Control any long-term (chronic) conditions you have, such as high  cholesterol or diabetes. Identify your sources of stress and find ways to manage stress. This may include meditation, deep breathing, or making time for fun activities.  Alcohol use Do not drink alcohol if: Your health care provider tells you not to drink. You are pregnant, may be pregnant, or are planning to become pregnant. If you drink alcohol: Limit how much you use to: 0-1 drink a day for women. 0-2 drinks a day for men. Be aware of how much alcohol is in your drink. In the [place] one drink equals one 12 oz bottle of beer (355 mL), one 5 oz glass of wine (148 mL), or one 1 oz glass of hard liquor (44 mL). Medicines Your health care provider may prescribe medicine if lifestyle changes are not enough to get your blood pressure under control and if: Your systolic blood pressure is 130 or higher. Your diastolic blood pressure is 80 or higher. Take medicines only as told by your health care provider. Follow the directions carefully. Blood pressure medicines must be taken as told by your health care provider. The medicine does not work as well when you skip doses. Skippingdoses also puts you at risk for problems. Monitoring Before you monitor your blood pressure: Do not smoke, drink caffeinated beverages, or exercise within 30 minutes before taking a measurement. Use the bathroom and empty your bladder (urinate). Sit quietly for at least 5 minutes before taking measurements. Monitor your blood pressure at home as told by your health care provider. To do this: Sit with your back straight and supported. Place your feet flat on the floor. Do not cross your legs. Support your arm on a flat surface, such as a table. Make sure your upper arm is at heart level. Each time you measure, take two or three readings one minute apart and record the results. You may also need to have your blood pressure checked regularly by your healthcare provider. General information Talk with your health care  provider about your diet, exercise habits, and other lifestyle factors that may be contributing to hypertension. Review all the medicines you take with your health care provider because there may be side effects or interactions. Keep all visits as told by your health care provider. Your health care provider can help you create and adjust your plan for managing your high blood pressure. Where to find more information National Heart, Lung, and Blood Institute: www.nhlbi.nih.gov American Heart Association: www.heart.org Contact a health care provider if: You think you are having a reaction to medicines you have taken. You have repeated (recurrent) headaches. You feel dizzy. You have swelling in your ankles. You have trouble with your vision. Get help right away if: You develop a severe headache or confusion. You have unusual weakness or numbness, or you feel faint. You have severe pain in your chest or abdomen. You vomit repeatedly. You have trouble breathing. These symptoms may represent a serious problem that is an emergency. Do not wait to see if the symptoms will go away. Get medical help right away. Call your local emergency services (911 in the [place].S[place] Do not drive yourself to the hospital. Summary Hypertension is when the force of blood pumping through your arteries is too strong. If this condition is not controlled, it may put you at risk for serious complications. Your personal target blood pressure may vary depending on your medical conditions,   your age, and other factors. For most people, a normal blood pressure is less than 120/80. Hypertension is managed by lifestyle changes, medicines, or both. Lifestyle changes to help manage hypertension include losing weight, eating a healthy, low-sodium diet, exercising more, stopping smoking, and limiting alcohol. This information is not intended to replace advice given to you by your health care provider. Make sure you discuss any questions  you have with your healthcare provider. Document Revised: 04/02/2019 Document Reviewed: 01/26/2019 Elsevier Patient Education  2022 Elsevier Inc.  

## 2020-10-23 NOTE — Progress Notes (Signed)
[place], [place]  [address] Phone:  [phone]   Fax:  [phone]   Established Patient Office Visit  Subjective:  Patient ID: Christy Price, female    DOB: January 13, 1971  Age: 50 y.o. MRN: [id number]  CC:  Chief Complaint  Patient presents with   Annual Exam    Discharge with odor, clear. Lisinopril makes her tired, dizzy, sleepy stopped taking a few ago.     HPI Christy Price presents for follow up. She  has a past medical history of Cancer ([place]), Hypertension, and Meningitis.   Hypertension Patient is here for follow-up of elevated blood pressure. She is not exercising and somewhat adherent to a low-salt diet. Blood pressure is not monitored at home. Cardiac symptoms: lower extremity edema. Patient denies chest pain, chest pressure/discomfort, claudication, dyspnea, exertional chest pressure/discomfort, fatigue, irregular heart beat, near-syncope, orthopnea, palpitations, paroxysmal nocturnal dyspnea, and syncope. Cardiovascular risk factors: hypertension, obesity (BMI >= 30 kg/m2), sedentary lifestyle, and smoking/ tobacco exposure. Use of agents associated with hypertension: none. Marland Kitchen History of target organ damage: none. She has had side effects with the lisinopril 2-3 months ago. She had been on the lisinopril for one year. She decided to discontinue this. She is feeling better.   She has a increased stress with her husband. 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   Vaginitis Patient presents for evaluation of an abnormal vaginal discharge. Symptoms have been present for several days. Vaginal symptoms: odor. Contraception: none. She denies abnormal bleeding, blisters, bumps, burning, dyspareunia, lesions, local irritation, pain, post coital bleeding, tears, vulvar itching, and warts. Sexually transmitted infection risk: very low risk of STD exposure. Menstrual flow: Hysterectomy.  Past Medical  History:  Diagnosis Date   Cancer ([place])     hx cervical cx    Hypertension    Meningitis     Past Surgical History:  Procedure Laterality Date   SHUNT REPLACEMENT      Family History  Problem Relation Age of Onset   Breast cancer Sister     Social History   Socioeconomic History   Marital status: Married    Spouse name: Not on file   Number of children: Not on file   Years of education: Not on file   Highest education level: Not on file  Occupational History   Not on file  Tobacco Use   Smoking status: Every Day    Packs/day: 1.00    Types: Cigarettes   Smokeless tobacco: Never  Vaping Use   Vaping Use: Never used  Substance and Sexual Activity   Alcohol use: Yes    Alcohol/week: 0.0 standard drinks    Comment: Social   Drug use: No   Sexual activity: Not Currently  Other Topics Concern   Not on file  Social History Narrative   Not on file   Social Determinants of Health   Financial Resource Strain: Not on file  Food Insecurity: Not on file  Transportation Needs: Not on file  Physical Activity: Not on file  Stress: Not on file  Social Connections: Not on file  Intimate Partner Violence: Not on file    Outpatient Medications Prior to Visit  Medication Sig Dispense Refill   acyclovir (ZOVIRAX) 200 MG capsule Take 2 capsules (400 mg total) by mouth 2 (two) times daily. 60 capsule 6   celecoxib (CELEBREX) 200 MG capsule Take 1 capsule (200 mg total) by mouth 2 (  two) times daily. 180 capsule 3   cyclobenzaprine (FLEXERIL) 10 MG tablet Take 1 tablet (10 mg total) by mouth 3 (three) times daily as needed for muscle spasms. 30 tablet 6   furosemide (LASIX) 20 MG tablet Take 1 tablet (20 mg total) by mouth daily. 90 tablet 3   hydrochlorothiazide (HYDRODIURIL) 25 MG tablet Take 1 tablet (25 mg total) by mouth daily. 90 tablet 3   terbinafine (LAMISIL) 250 MG tablet Take 1 tablet (250 mg total) by mouth daily. 90 tablet 1   Cholecalciferol (D3 PO) Take by mouth.  (Patient not taking: Reported on 10/23/2020)     lisinopril (ZESTRIL) 10 MG tablet Take 1 tablet (10 mg total) by mouth daily. (Patient not taking: Reported on 10/23/2020) 90 tablet 3   Hydrocortisone Acetate 1 % CREA Apply 28 g topically 2 (two) times daily as needed. 28 g 3   No facility-administered medications prior to visit.    Allergies  Allergen Reactions   Oxycodone    Rifampin     ROS Review of Systems    Objective:    Physical Exam Constitutional:      Appearance: She is obese.  HENT:     Head: Normocephalic and atraumatic.     Nose: Nose normal.     Mouth/Throat:     Mouth: Mucous membranes are moist.  Cardiovascular:     Rate and Rhythm: Normal rate and regular rhythm.     Pulses: Normal pulses.     Heart sounds: Normal heart sounds.  Pulmonary:     Effort: Pulmonary effort is normal.     Breath sounds: Normal breath sounds.  Abdominal:     General: Bowel sounds are normal.     Palpations: Abdomen is soft.     Comments: Increased abdominal girth   Musculoskeletal:     Cervical back: Normal range of motion.     Right lower leg: No edema.     Left lower leg: No edema.     Comments: Increased size and visible discoloration no swelling or edema   Skin:    General: Skin is warm.     Capillary Refill: Capillary refill takes less than 2 seconds.  Neurological:     General: No focal deficit present.     Mental Status: She is alert and oriented to person, place, and time.  Psychiatric:        Mood and Affect: Mood normal.        Behavior: Behavior normal.        Thought Content: Thought content normal.        Judgment: Judgment normal.   BP (!) 148/72 (BP Location: Left Arm, Patient Position: Sitting)   Pulse 76   Temp 97.9 F (36.6 C)   Ht '4\' 11"'$  (1.499 m)   Wt 175 lb (79.4 kg)   SpO2 100%   BMI 35.35 kg/m  Wt Readings from Last 3 Encounters:  10/23/20 175 lb (79.4 kg)  06/12/20 175 lb (79.4 kg)  12/28/19 178 lb 6.4 oz (80.9 kg)     Health  Maintenance Due  Topic Date Due   Pneumococcal Vaccine 59-57 Years old (1 - PCV) Never done   Zoster Vaccines- Shingrix (1 of 2) Never done   INFLUENZA VACCINE  10/09/2020    There are no preventive care reminders to display for this patient.  Lab Results  Component Value Date   TSH 1.830 11/05/2019   Lab Results  Component Value Date   WBC  8.5 11/05/2019   HGB 16.1 (H) 11/05/2019   HCT 45.6 11/05/2019   MCV 94 11/05/2019   PLT 310 11/05/2019   Lab Results  Component Value Date   NA 139 11/05/2019   K 3.9 11/05/2019   CO2 24 11/05/2019   GLUCOSE 84 11/05/2019   BUN 15 11/05/2019   CREATININE 0.73 11/05/2019   BILITOT <0.2 11/05/2019   ALKPHOS 88 11/05/2019   AST 18 11/05/2019   ALT 12 11/05/2019   PROT 6.9 11/05/2019   ALBUMIN 4.7 11/05/2019   CALCIUM 9.7 11/05/2019   Lab Results  Component Value Date   CHOL 181 11/05/2019   Lab Results  Component Value Date   HDL 61 11/05/2019   Lab Results  Component Value Date   LDLCALC 101 (H) 11/05/2019   Lab Results  Component Value Date   TRIG 106 11/05/2019   Lab Results  Component Value Date   CHOLHDL 3.0 11/05/2019   Lab Results  Component Value Date   HGBA1C 4.8 05/07/2019      Assessment & Plan:   Problem List Items Addressed This Visit       Cardiovascular and Mediastinum   Essential hypertension - Primary Encouraged on going compliance with current medication regimen Encouraged home monitoring and recording BP <130/80 Eating a heart-healthy diet with less salt Encouraged regular physical activity  Recommend Weight loss   Relevant Medications   furosemide (LASIX) 20 MG tablet   hydrochlorothiazide (HYDRODIURIL) 25 MG tablet   Other Relevant Orders   Urinalysis Dipstick (Completed)   Comp. Metabolic Panel (12) (Completed)     Nervous and Auditory   Nonspeaking deaf     Genitourinary   Genital herpes   Relevant Medications   terbinafine (LAMISIL) 250 MG tablet     Other   Peripheral  edema Persistent  Encourage compliance with current treatment plan Work up to evaluate for Heart failure, hepatic and renal disease, venous insufficiency  Avoid sitting or staying in one position for long periods of time. Encourage movement Avoid eating too much salt Avoid NSAIDs Recommend elevation above heart level several times and day  Proper fitting compression socks or stocking Skin protection: keep skin clean, moisturized and free of injury    Relevant Medications   furosemide (LASIX) 20 MG tablet   Other Visit Diagnoses     Toenail fungus       Relevant Medications   terbinafine (LAMISIL) 250 MG tablet   Vaginal discharge       Relevant Orders   NuSwab Vaginitis Plus (VG+) (Completed)   Vaginal odor     NuSwab is pending    Relevant Orders   NuSwab Vaginitis Plus (VG+) (Completed)   Colon cancer screening declined       Screening for colon cancer       Relevant Orders   Ambulatory referral to Gastroenterology   Encounter for hepatitis C screening test for low risk patient       Relevant Orders   Hepatitis C antibody (Completed)   Screening for HIV (human immunodeficiency virus)       Relevant Medications   terbinafine (LAMISIL) 250 MG tablet   Other Relevant Orders   HIV Antibody (routine testing w rflx) (Completed)   Chronic knee pain, unspecified laterality       Relevant Medications   cyclobenzaprine (FLEXERIL) 10 MG tablet   Bilateral lower extremity edema       Relevant Medications   furosemide (LASIX) 20 MG tablet   Urine  white blood cells increased       Relevant Orders   Urine Culture (Completed)       Meds ordered this encounter  Medications   terbinafine (LAMISIL) 250 MG tablet    Sig: Take 1 tablet (250 mg total) by mouth daily.    Dispense:  90 tablet    Refill:  1   fluconazole (DIFLUCAN) 150 MG tablet    Sig: Take 1 tablet (150 mg total) by mouth once for 1 dose.    Dispense:  1 tablet    Refill:  0    Order Specific Question:    Supervising Provider    Answer:   Tresa Garter [id number]   cyclobenzaprine (FLEXERIL) 10 MG tablet    Sig: Take 1 tablet (10 mg total) by mouth 3 (three) times daily as needed for muscle spasms.    Dispense:  30 tablet    Refill:  6    Order Specific Question:   Supervising Provider    Answer:   Tresa Garter [id number]   furosemide (LASIX) 20 MG tablet    Sig: Take 1 tablet (20 mg total) by mouth daily.    Dispense:  90 tablet    Refill:  3    Order Specific Question:   Supervising Provider    Answer:   Tresa Garter [id number]   hydrochlorothiazide (HYDRODIURIL) 25 MG tablet    Sig: Take 1 tablet (25 mg total) by mouth daily.    Dispense:  90 tablet    Refill:  3    Order Specific Question:   Supervising Provider    Answer:   Tresa Garter [id number]     Follow-up: Return in about 6 months (around 04/25/2021).    Vevelyn Francois, NP

## 2020-10-24 LAB — COMP. METABOLIC PANEL (12)
AST: 18 IU/L (ref 0–40)
Albumin/Globulin Ratio: 2.2 (ref 1.2–2.2)
Albumin: 5 g/dL — ABNORMAL HIGH (ref 3.8–4.9)
Alkaline Phosphatase: 100 IU/L (ref 44–121)
BUN/Creatinine Ratio: 14 (ref 9–23)
BUN: 9 mg/dL (ref 6–24)
Bilirubin Total: 0.3 mg/dL (ref 0.0–1.2)
Calcium: 9.9 mg/dL (ref 8.7–10.2)
Chloride: 97 mmol/L (ref 96–106)
Creatinine, Ser: 0.66 mg/dL (ref 0.57–1.00)
Globulin, Total: 2.3 g/dL (ref 1.5–4.5)
Glucose: 107 mg/dL — ABNORMAL HIGH (ref 65–99)
Potassium: 3.9 mmol/L (ref 3.5–5.2)
Sodium: 140 mmol/L (ref 134–144)
Total Protein: 7.3 g/dL (ref 6.0–8.5)
eGFR: 106 mL/min/{1.73_m2} (ref >59)

## 2020-10-24 LAB — HEPATITIS C ANTIBODY: Hep C Virus Ab: <0.1 s/co ratio (ref 0.0–0.9)

## 2020-10-24 LAB — HIV ANTIBODY (ROUTINE TESTING W REFLEX): HIV Screen 4th Generation wRfx: NONREACTIVE

## 2020-10-25 LAB — URINE CULTURE

## 2020-10-26 LAB — NUSWAB VAGINITIS PLUS (VG+)
Candida albicans, NAA: NEGATIVE
Candida glabrata, NAA: NEGATIVE
Chlamydia trachomatis, NAA: NEGATIVE
Neisseria gonorrhoeae, NAA: NEGATIVE
Trich vag by NAA: NEGATIVE

## 2020-11-06 ENCOUNTER — Other Ambulatory Visit: Payer: Self-pay | Admitting: Nurse Practitioner

## 2020-11-06 ENCOUNTER — Encounter: Payer: Self-pay | Admitting: Nurse Practitioner

## 2020-11-06 ENCOUNTER — Ambulatory Visit (HOSPITAL_COMMUNITY)
Admission: RE | Admit: 2020-11-06 | Discharge: 2020-11-06 | Disposition: A | Discharge status: Home / Self Care | Payer: Medicare HMO | Source: Ambulatory Visit | Attending: Nurse Practitioner | Admitting: Nurse Practitioner

## 2020-11-06 ENCOUNTER — Other Ambulatory Visit: Payer: Self-pay

## 2020-11-06 ENCOUNTER — Ambulatory Visit (INDEPENDENT_AMBULATORY_CARE_PROVIDER_SITE_OTHER): Payer: Medicare HMO | Admitting: Nurse Practitioner

## 2020-11-06 VITALS — BP 150/75 | HR 61 | Temp 97.9°F | Ht 60.0 in | Wt 174.6 lb

## 2020-11-06 DIAGNOSIS — R0781 Pleurodynia: Secondary | ICD-10-CM

## 2020-11-06 DIAGNOSIS — H913 Deaf nonspeaking, not elsewhere classified: Secondary | ICD-10-CM | POA: Diagnosis not present

## 2020-11-06 DIAGNOSIS — R1011 Right upper quadrant pain: Secondary | ICD-10-CM

## 2020-11-06 DIAGNOSIS — M5489 Other dorsalgia: Secondary | ICD-10-CM | POA: Diagnosis not present

## 2020-11-06 DIAGNOSIS — S2241XA Multiple fractures of ribs, right side, initial encounter for closed fracture: Secondary | ICD-10-CM | POA: Diagnosis not present

## 2020-11-06 DIAGNOSIS — M546 Pain in thoracic spine: Secondary | ICD-10-CM | POA: Diagnosis not present

## 2020-11-06 LAB — POCT URINALYSIS DIP (CLINITEK)
Bilirubin, UA: NEGATIVE
Glucose, UA: NEGATIVE mg/dL
Ketones, POC UA: NEGATIVE mg/dL
Nitrite, UA: NEGATIVE
POC PROTEIN,UA: NEGATIVE
Spec Grav, UA: 1.02 (ref 1.010–1.025)
Urobilinogen, UA: 0.2 E.U./dL
pH, UA: 7 (ref 5.0–8.0)

## 2020-11-06 NOTE — Patient Instructions (Addendum)
Salonpas patches    Thoracic Strain Rehab Ask your health care provider which exercises are safe for you. Do exercises exactly as told by your health care provider and adjust them as directed. It is normal to feel mild stretching, pulling, tightness, or discomfort as you do these exercises. Stop right away if you feel sudden pain or your pain gets worse. Do not begin these exercises until told by your health care provider. Stretching and range-of-motion exercise This exercise warms up your muscles and joints and improves the movement and flexibility of your back and shoulders. This exercise also helps to relievepain. Chest and spine stretch  Lie down on your back on a firm surface. Roll a towel or a small blanket so it is about 4 inches (10 cm) in diameter. Put the towel lengthwise under the middle of your back so it is under your spine, but not under your shoulder blades. Put your hands behind your head and let your elbows fall to your sides. This will increase your stretch. Take a deep breath (inhale). Hold for __________ seconds. Relax after you breathe out (exhale). Repeat __________ times. Complete this exercise __________ times a day. Strengthening exercises These exercises build strength and endurance in your back and your shoulder blade muscles. Endurance is the ability to use your muscles for a long time,even after they get tired. Alternating arm and leg raises  Get on your hands and knees on a firm surface. If you are on a hard floor, you may want to use padding, such as an exercise mat, to cushion your knees. Line up your arms and legs. Your hands should be directly below your shoulders, and your knees should be directly below your hips. Lift your left leg behind you. At the same time, raise your right arm and straighten it in front of you. Do not lift your leg higher than your hip. Do not lift your arm higher than your shoulder. Keep your abdominal and back muscles tight. Keep  your hips facing the ground. Do not arch your back. Keep your balance carefully, and do not hold your breath. Hold for __________ seconds. Slowly return to the starting position and repeat with your right leg and your left arm. Repeat __________ times. Complete this exercise __________ times a day. Straight arm rows This exercise is also called shoulder extension exercise. Stand with your feet shoulder width apart. Secure an exercise band to a stable object in front of you so the band is at or above shoulder height. Hold one end of the exercise band in each hand. Straighten your elbows and lift your hands up to shoulder height. Step back, away from the secured end of the exercise band, until the band stretches. Squeeze your shoulder blades together and pull your hands down to the sides of your thighs. Stop when your hands are straight down by your sides. This is shoulder extension. Do not let your hands go behind your body. Hold for __________ seconds. Slowly return to the starting position. Repeat __________ times. Complete this exercise __________ times a day. Prone shoulder external rotation Lie on your abdomen on a firm bed so your left / right forearm hangs over the edge of the bed and your upper arm is on the bed, straight out from your body. This is the prone position. Your elbow should be bent. Your palm should be facing your feet. If instructed, hold a __________ weight in your hand. Squeeze your shoulder blade toward the middle of your back.  Do not let your shoulder lift toward your ear. Keep your elbow bent in a 90-degree angle (right angle) while you slowly move your forearm up toward the ceiling. Move your forearm up to the height of the bed, toward your head. This is external rotation. Your upper arm should not move. At the top of the movement, your palm should face the floor. Hold for __________ seconds. Slowly return to the starting position and relax your muscles. Repeat  __________ times. Complete this exercise __________ times a day. Rowing scapular retraction This is an exercise in which the shoulder blades (scapulae) are pulled toward each other (retraction). Sit in a stable chair without armrests, or stand up. Secure an exercise band to a stable object in front of you so the band is at shoulder height. Hold one end of the exercise band in each hand. Your palms should face down. Bring your arms out straight in front of you. Step back, away from the secured end of the exercise band, until the band stretches. Pull the band backward. As you do this, bend your elbows and squeeze your shoulder blades together, but avoid letting the rest of your body move. Do not shrug your shoulders upward while you do this. Stop when your elbows are at your sides or slightly behind your body. Hold for __________ seconds. Slowly straighten your arms to return to the starting position. Repeat __________ times. Complete this exercise __________ times a day. Posture and body mechanics Good posture and healthy body mechanics can help to relieve stress in your body's tissues and joints. Body mechanics refers to the movements and positions of your body while you do your daily activities. Posture is part of body mechanics. Good posture means: Your spine is in its natural S-curve position (neutral). Your shoulders are pulled back slightly. Your head is not tipped forward. Follow these guidelines to improve your posture and body mechanics in youreveryday activities. Standing  When standing, keep your spine neutral and your feet about hip width apart. Keep a slight bend in your knees. Your ears, shoulders, and hips should line up with each other. When you do a task in which you lean forward while standing in one place for a long time, place one foot up on a stable object that is 2-4 inches (5-10 cm) high, such as a footstool. This helps keep your spine neutral.  Sitting  When sitting,  keep your spine neutral and keep your feet flat on the floor. Use a footrest, if necessary, and keep your thighs parallel to the floor. Avoid rounding your shoulders, and avoid tilting your head forward. When working at a desk or a computer, keep your desk at a height where your hands are slightly lower than your elbows. Slide your chair under your desk so you are close enough to maintain good posture. When working at a computer, place your monitor at a height where you are looking straight ahead and you do not have to tilt your head forward or downward to look at the screen.  Resting When lying down and resting, avoid positions that are most painful for you. If you have pain with activities such as sitting, bending, stooping, or squatting (flexion-basedactivities), lie in a position in which your body does not bend very much. For example, avoid curling up on your side with your arms and knees near your chest (fetal position). If you have pain with activities such as standing for a long time or reaching with your arms (extension-basedactivities), lie  with your spine in a neutral position and bend your knees slightly. Try the following positions: Lie on your side with a pillow between your knees. Lie on your back with a pillow under your knees.  Lifting  When lifting objects, keep your feet at least shoulder width apart and tighten your abdominal muscles. Bend your knees and hips and keep your spine neutral. It is important to lift using the strength of your legs, not your back. Do not lock your knees straight out. Always ask for help to lift heavy or awkward objects.  This information is not intended to replace advice given to you by your health care provider. Make sure you discuss any questions you have with your healthcare provider. Document Revised: 06/19/2018 Document Reviewed: 04/06/2018 Elsevier Patient Education  2022 [employer]. Chest Wall Pain Chest wall pain is pain in or around the  bones and muscles of your chest. Sometimes, an injury causes this pain. Excessive coughing or overuse of arm and chest muscles may also cause chest wall pain. Sometimes, the cause may not beknown. This pain may take several weeks or longer to get better. Follow these instructions at home: Managing pain, stiffness, and swelling  If directed, put ice on the painful area: Put ice in a plastic bag. Place a towel between your skin and the bag. Leave the ice on for 20 minutes, 2-3 times per day.  Activity Rest as told by your health care provider. Avoid activities that cause pain. These include any activities that use your chest muscles or your abdominal and side muscles to lift heavy items. Ask your health care provider what activities are safe for you. General instructions  Take over-the-counter and prescription medicines only as told by your health care provider. Do not use any products that contain nicotine or tobacco, such as cigarettes, e-cigarettes, and chewing tobacco. These can delay healing after injury. If you need help quitting, ask your health care provider. Keep all follow-up visits as told by your health care provider. This is important.  Contact a health care provider if: You have a fever. Your chest pain becomes worse. You have new symptoms. Get help right away if: You have nausea or vomiting. You feel sweaty or light-headed. You have a cough with mucus from your lungs (sputum) or you cough up blood. You develop shortness of breath. These symptoms may represent a serious problem that is an emergency. Do not wait to see if the symptoms will go away. Get medical help right away. Call your local emergency services (911 in the [place] Do not drive yourself to the hospital. Summary Chest wall pain is pain in or around the bones and muscles of your chest. Depending on the cause, it may be treated with ice, rest, medicines, and avoiding activities that cause pain. Contact a health  care provider if you have a fever, worsening chest pain, or new symptoms. Get help right away if you feel light-headed or you develop shortness of breath. These symptoms may be an emergency. This information is not intended to replace advice given to you by your health care provider. Make sure you discuss any questions you have with your healthcare provider. Document Revised: 08/28/2017 Document Reviewed: 08/28/2017 Elsevier Patient Education  2022 [employer].

## 2020-11-06 NOTE — Progress Notes (Signed)
[facility] [place], [place]  [address] Phone:  [phone]   Fax:  [phone]   Acute Office Visit  Subjective:    Patient ID: Christy Price, female    DOB: 10/09/1970, 50 y.o.   MRN: [id number]  Chief Complaint  Patient presents with   Flank Pain    Pt is here today for lower right side pain last Wednesday 11/01/20 Pt states she think she might have hurt her lower right back and right side picking up the laundry at work. Pt states it hurts to move bend, stand, touch and put on her bra.    HPI Patient is in today for back pain. She  has a past medical history of Cancer ([place]), Hypertension, and Meningitis.  Back Pain Patient presents for evaluation of low back problems. Symptoms have been present for 5 days and include pain in right ribs and middle back (aching and tight band in character;. Initial inciting event:  while at work in [employer] . She felt a pop while bending over into the bin. The pain has been constant since . Symptoms are worse  with activity throughout the day . Alleviating factors identifiable by the patient are none. Aggravating factors identifiable by the patient are  movement . Treatments initiated by the patient: She has used ice, heating pad, muscle cream.. Previous lower back problems: none. Previous work up: none.  She is unable to sleep pretty constant. This last about one hours. 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   Past Medical History:  Diagnosis Date   Cancer ([place])     hx cervical cx    Hypertension    Meningitis     Past Surgical History:  Procedure Laterality Date   SHUNT REPLACEMENT      Family History  Problem Relation Age of Onset   Breast cancer Sister     Social History   Socioeconomic History   Marital status: Married    Spouse name: Not on file   Number of children: Not on file   Years of education: Not on file   Highest education level: Not on file   Occupational History   Not on file  Tobacco Use   Smoking status: Every Day    Packs/day: 1.50    Types: Cigarettes   Smokeless tobacco: Never  Vaping Use   Vaping Use: Never used  Substance and Sexual Activity   Alcohol use: Yes    Alcohol/week: 0.0 standard drinks    Comment: Social   Drug use: No   Sexual activity: Not Currently  Other Topics Concern   Not on file  Social History Narrative   Not on file   Social Determinants of Health   Financial Resource Strain: Not on file  Food Insecurity: Not on file  Transportation Needs: Not on file  Physical Activity: Not on file  Stress: Not on file  Social Connections: Not on file  Intimate Partner Violence: Not on file    Outpatient Medications Prior to Visit  Medication Sig Dispense Refill   acyclovir (ZOVIRAX) 200 MG capsule Take 2 capsules (400 mg total) by mouth 2 (two) times daily. 60 capsule 6   celecoxib (CELEBREX) 200 MG capsule Take 1 capsule (200 mg total) by mouth 2 (two) times daily. 180 capsule 3   cyclobenzaprine (FLEXERIL) 10 MG tablet Take 1 tablet (10 mg total) by mouth 3 (three) times daily as needed for muscle  spasms. 30 tablet 6   furosemide (LASIX) 20 MG tablet Take 1 tablet (20 mg total) by mouth daily. 90 tablet 3   hydrochlorothiazide (HYDRODIURIL) 25 MG tablet Take 1 tablet (25 mg total) by mouth daily. 90 tablet 3   terbinafine (LAMISIL) 250 MG tablet Take 1 tablet (250 mg total) by mouth daily. 90 tablet 1   Cholecalciferol (D3 PO) Take by mouth. (Patient not taking: No sig reported)     lisinopril (ZESTRIL) 10 MG tablet Take 1 tablet (10 mg total) by mouth daily. (Patient not taking: No sig reported) 90 tablet 3   No facility-administered medications prior to visit.    Allergies  Allergen Reactions   Oxycodone    Rifampin     Review of Systems     Objective:    Physical Exam Constitutional:      Appearance: She is obese.  HENT:     Head: Normocephalic and atraumatic.   Cardiovascular:     Rate and Rhythm: Normal rate and regular rhythm.     Pulses: Normal pulses.     Heart sounds: Normal heart sounds.  Pulmonary:     Effort: Pulmonary effort is normal.     Breath sounds: Normal breath sounds.  Abdominal:     Palpations: There is no mass.     Tenderness: There is abdominal tenderness. There is guarding. There is no right CVA tenderness, left CVA tenderness or rebound.     Hernia: No hernia is present.     Comments: Increased abdominal girth Right upper quadrant tenderness questionable positive Murphy sign   Musculoskeletal:        General: Swelling (Bilateral lower extremities unchanged) present. Normal range of motion.     Cervical back: Normal range of motion.     Right lower leg: Edema (Nonpitting) present.     Left lower leg: Edema present.  Skin:    General: Skin is warm and dry.     Capillary Refill: Capillary refill takes less than 2 seconds.  Neurological:     General: No focal deficit present.     Mental Status: She is alert and oriented to person, place, and time.    BP (!) 150/75   Pulse 61   Temp 97.9 F (36.6 C)   Ht 5' (1.524 m)   Wt 174 lb 9.6 oz (79.2 kg)   SpO2 98%   BMI 34.10 kg/m  Wt Readings from Last 3 Encounters:  11/06/20 174 lb 9.6 oz (79.2 kg)  10/23/20 175 lb (79.4 kg)  06/12/20 175 lb (79.4 kg)    Health Maintenance Due  Topic Date Due   Pneumococcal Vaccine 55-71 Years old (1 - PCV) Never done   Zoster Vaccines- Shingrix (1 of 2) Never done   INFLUENZA VACCINE  10/09/2020    There are no preventive care reminders to display for this patient.   Lab Results  Component Value Date   TSH 1.830 11/05/2019   Lab Results  Component Value Date   WBC 8.5 11/05/2019   HGB 16.1 (H) 11/05/2019   HCT 45.6 11/05/2019   MCV 94 11/05/2019   PLT 310 11/05/2019   Lab Results  Component Value Date   NA 140 10/23/2020   K 3.9 10/23/2020   CO2 24 11/05/2019   GLUCOSE 107 (H) 10/23/2020   BUN 9 10/23/2020    CREATININE 0.66 10/23/2020   BILITOT 0.3 10/23/2020   ALKPHOS 100 10/23/2020   AST 18 10/23/2020   ALT 12 11/05/2019  PROT 7.3 10/23/2020   ALBUMIN 5.0 (H) 10/23/2020   CALCIUM 9.9 10/23/2020   EGFR 106 10/23/2020   Lab Results  Component Value Date   CHOL 181 11/05/2019   Lab Results  Component Value Date   HDL 61 11/05/2019   Lab Results  Component Value Date   LDLCALC 101 (H) 11/05/2019   Lab Results  Component Value Date   TRIG 106 11/05/2019   Lab Results  Component Value Date   CHOLHDL 3.0 11/05/2019   Lab Results  Component Value Date   HGBA1C 4.8 05/07/2019       Assessment & Plan:   Problem List Items Addressed This Visit       Other   Back pain - Primary Persistent Possible differential diagnosis gallstones, cholelithiasis Encourage patient to use Celebrex and cyclobenzaprine Also encourage Salonpas patch and may continue with ice, heat, muscle rub   Relevant Orders   POCT URINALYSIS DIP (CLINITEK) (Completed)   DG Thoracic Spine 2 View (Completed)   Other Visit Diagnoses     Right upper quadrant pain     X-ray pending If symptoms persist right upper quadrant ultrasound to follow Held off due to symptoms being related to work    Rib pain on right side   Persistent x-rays pending        No orders of the defined types were placed in this encounter.    Vevelyn Francois, NP

## 2020-11-15 ENCOUNTER — Telehealth: Payer: Self-pay | Admitting: Clinical

## 2020-11-15 NOTE — Telephone Encounter (Signed)
Integrated Behavioral Health Case Management Referral Note  11/15/2020 Name: [name] MRN: [id number] DOB: 06/11/1970 Christy Price is a 50 y.o. year old female who sees Vevelyn Francois, NP for primary care. LCSW was consulted to assess patient's needs and assist the patient with financial difficulties related to low income.   Interpreter: Yes.     Interpreter Name & Language: ASL video interpreter service through patient's phone  Assessment: Patient experiencing financial difficulties related to low income. Patient is also in need of a home blood pressure monitor per her PCP.  Intervention: CSW called patient and ASL interpreter answered and assisted. Assessed patient's financial need for assistance with blood pressure monitor. CSW to coordinate with [facility] [employer] for possible assistance with this.   Review of patient status, including review of consultants reports, relevant laboratory and other test results, and collaboration with appropriate care team members and the patient's provider was performed as part of comprehensive patient evaluation and provision of services.    Estanislado Emms, [facility] [phone]

## 2020-11-17 ENCOUNTER — Other Ambulatory Visit: Payer: Self-pay | Admitting: Nurse Practitioner

## 2020-11-17 ENCOUNTER — Other Ambulatory Visit (HOSPITAL_COMMUNITY): Payer: Self-pay

## 2020-11-17 MED ORDER — BLOOD PRESSURE MONITOR MISC
1.0000 | Freq: Every day | 0 refills | Status: DC
  Filled 2020-11-17: qty 1, 30d supply, fill #0

## 2020-11-20 ENCOUNTER — Other Ambulatory Visit (HOSPITAL_COMMUNITY): Payer: Self-pay

## 2020-11-21 ENCOUNTER — Other Ambulatory Visit (HOSPITAL_COMMUNITY): Payer: Self-pay

## 2020-11-24 ENCOUNTER — Telehealth: Payer: Self-pay | Admitting: Clinical

## 2020-11-24 NOTE — Telephone Encounter (Addendum)
Integrated Behavioral Health Case Management Referral Note  11/24/2020 Name: Christy Price MRN: [id number] DOB: 08-Aug-1970 Christy Price is a 50 y.o. year old female who sees Vevelyn Francois, NP for primary care. LCSW was consulted to assess patient's needs and assist the patient with financial difficulties related to low income.   Interpreter: Yes.     Interpreter Name & Language: ASL video interpreter service through patient's phone  Assessment: Patient experiencing financial difficulties related to low income. Patient is also in need of a home blood pressure monitor per her PCP.  Intervention: Patient [facility]) has obtained a home blood pressure monitor for patient with support from the patient assistance fund and patient will need to come in to pick it up and get instruction from [place] on how to use it. Called patient and patient's ASL interpreter service answered and assisted. Scheduled patient for BP check and to pick up monitor on 11/30/20.  Review of patient status, including review of consultants reports, relevant laboratory and other test results, and collaboration with appropriate care team members and the patient's provider was performed as part of comprehensive patient evaluation and provision of services.    Estanislado Emms, [facility] [phone]

## 2020-11-28 ENCOUNTER — Telehealth: Payer: Self-pay | Admitting: Nurse Practitioner

## 2020-11-28 NOTE — Telephone Encounter (Signed)
Left message for patient to call back and schedule Medicare Annual Wellness Visit    awvi 08/10/10 per [facility]   please schedule at anytime with health coach

## 2020-11-29 ENCOUNTER — Other Ambulatory Visit: Payer: Self-pay

## 2020-11-29 ENCOUNTER — Ambulatory Visit: Payer: Medicare HMO | Admitting: Nurse Practitioner

## 2020-11-29 VITALS — BP 147/59 | HR 70 | Temp 98.0°F | Ht 60.0 in | Wt 174.2 lb

## 2020-11-29 DIAGNOSIS — I10 Essential (primary) hypertension: Secondary | ICD-10-CM

## 2020-12-15 ENCOUNTER — Encounter: Payer: Self-pay | Admitting: Nurse Practitioner

## 2020-12-15 ENCOUNTER — Ambulatory Visit (INDEPENDENT_AMBULATORY_CARE_PROVIDER_SITE_OTHER): Payer: Medicare HMO | Admitting: Nurse Practitioner

## 2020-12-15 ENCOUNTER — Other Ambulatory Visit: Payer: Self-pay

## 2020-12-15 VITALS — BP 153/74 | HR 69 | Temp 97.7°F | Ht 60.0 in | Wt 170.0 lb

## 2020-12-15 DIAGNOSIS — J069 Acute upper respiratory infection, unspecified: Secondary | ICD-10-CM

## 2020-12-15 MED ORDER — BENZONATATE 100 MG PO CAPS: 100.0000 mg | ORAL_CAPSULE | Freq: Three times a day (TID) | ORAL | 0 refills | Status: AC | PRN

## 2020-12-15 MED ORDER — AMOXICILLIN-POT CLAVULANATE 875-125 MG PO TABS: 1.0000 | ORAL_TABLET | Freq: Two times a day (BID) | ORAL | 0 refills | Status: AC

## 2020-12-15 NOTE — Progress Notes (Signed)
Christy Price, [place]  [address] Phone:  [phone]   Fax:  [phone]    Established Patient Office Visit  Subjective:  Patient ID: Christy Price, female    DOB: 1971-02-16  Age: 50 y.o. MRN: [id number]  CC:  Chief Complaint  Patient presents with   Cough    Pt states that she has had a runny/itchy nose, watery eyes and a dry cough for a couple of days.  Pt wants to get tested so her job will know if she can work or not.  Pt can't take liquid cough medicine because it gives her headaches.    HPI Christy Price presents for follow up.  She  has a past medical history of Cancer ([place]), Hypertension, and Meningitis.   Upper Respiratory Infection Patient complains of symptoms of a URI. Symptoms include nasal congestion. She has has cough and congestion with yellow mucous. Onset of symptoms was 3 days ago, and has been gradually worsening since that time. Treatment to date: antihistamines, cough suppressants, and decongestants.  Past Medical History:  Diagnosis Date   Cancer ([place])     hx cervical cx    Hypertension    Meningitis     Past Surgical History:  Procedure Laterality Date   SHUNT REPLACEMENT      Family History  Problem Relation Age of Onset   Breast cancer Sister     Social History   Socioeconomic History   Marital status: Married    Spouse name: Not on file   Number of children: Not on file   Years of education: Not on file   Highest education level: Not on file  Occupational History   Not on file  Tobacco Use   Smoking status: Every Day    Packs/day: 1.50    Types: Cigarettes   Smokeless tobacco: Never  Vaping Use   Vaping Use: Never used  Substance and Sexual Activity   Alcohol use: Yes    Alcohol/week: 0.0 standard drinks    Comment: Social   Drug use: No   Sexual activity: Not Currently  Other Topics Concern   Not on file  Social History Narrative   Not on file   Social  Determinants of Health   Financial Resource Strain: Not on file  Food Insecurity: Not on file  Transportation Needs: Not on file  Physical Activity: Not on file  Stress: Not on file  Social Connections: Not on file  Intimate Partner Violence: Not on file    Outpatient Medications Prior to Visit  Medication Sig Dispense Refill   acyclovir (ZOVIRAX) 200 MG capsule Take 2 capsules (400 mg total) by mouth 2 (two) times daily. 60 capsule 6   Blood Pressure Monitor MISC Use as directed daily 1 each 0   cyclobenzaprine (FLEXERIL) 10 MG tablet Take 1 tablet (10 mg total) by mouth 3 (three) times daily as needed for muscle spasms. 30 tablet 6   furosemide (LASIX) 20 MG tablet Take 1 tablet (20 mg total) by mouth daily. 90 tablet 3   hydrochlorothiazide (HYDRODIURIL) 25 MG tablet Take 1 tablet (25 mg total) by mouth daily. 90 tablet 3   lisinopril (ZESTRIL) 10 MG tablet Take 1 tablet (10 mg total) by mouth daily. 90 tablet 3   terbinafine (LAMISIL) 250 MG tablet Take 1 tablet (250 mg total) by mouth daily. 90 tablet 1   celecoxib (CELEBREX) 200 MG capsule Take 1 capsule (200 mg total)  by mouth 2 (two) times daily. 180 capsule 3   Cholecalciferol (D3 PO) Take by mouth. (Patient not taking: No sig reported)     No facility-administered medications prior to visit.    Allergies  Allergen Reactions   Oxycodone    Rifampin     ROS Review of Systems    Objective:    Physical Exam Constitutional:      Appearance: She is obese.  HENT:     Head: Normocephalic and atraumatic.     Right Ear: Tympanic membrane normal.     Left Ear: Tympanic membrane normal.     Nose: Congestion present.     Mouth/Throat:     Mouth: Mucous membranes are moist.     Pharynx: Oropharynx is clear. Posterior oropharyngeal erythema present. No oropharyngeal exudate.  Neck:     Comments: Shunt palpable  Cardiovascular:     Rate and Rhythm: Normal rate and regular rhythm.     Pulses: Normal pulses.     Heart  sounds: Normal heart sounds.  Pulmonary:     Effort: Pulmonary effort is normal.     Comments: diminished Musculoskeletal:     Cervical back: Normal range of motion.  Lymphadenopathy:     Cervical: No cervical adenopathy.  Neurological:     Mental Status: She is alert.    BP (!) 153/74 Comment: manually  Pulse 69   Temp 97.7 F (36.5 C)   Ht 5' (1.524 m)   Wt 170 lb (77.1 kg)   SpO2 94%   BMI 33.20 kg/m  Wt Readings from Last 3 Encounters:  12/15/20 170 lb (77.1 kg)  11/29/20 174 lb 3.2 oz (79 kg)  11/06/20 174 lb 9.6 oz (79.2 kg)     There are no preventive care reminders to display for this patient.   There are no preventive care reminders to display for this patient.  Lab Results  Component Value Date   TSH 1.830 11/05/2019   Lab Results  Component Value Date   WBC 8.5 11/05/2019   HGB 16.1 (H) 11/05/2019   HCT 45.6 11/05/2019   MCV 94 11/05/2019   PLT 310 11/05/2019   Lab Results  Component Value Date   NA 140 10/23/2020   K 3.9 10/23/2020   CO2 24 11/05/2019   GLUCOSE 107 (H) 10/23/2020   BUN 9 10/23/2020   CREATININE 0.66 10/23/2020   BILITOT 0.3 10/23/2020   ALKPHOS 100 10/23/2020   AST 18 10/23/2020   ALT 12 11/05/2019   PROT 7.3 10/23/2020   ALBUMIN 5.0 (H) 10/23/2020   CALCIUM 9.9 10/23/2020   EGFR 106 10/23/2020   Lab Results  Component Value Date   CHOL 181 11/05/2019   Lab Results  Component Value Date   HDL 61 11/05/2019   Lab Results  Component Value Date   LDLCALC 101 (H) 11/05/2019   Lab Results  Component Value Date   TRIG 106 11/05/2019   Lab Results  Component Value Date   CHOLHDL 3.0 11/05/2019   Lab Results  Component Value Date   HGBA1C 4.8 05/07/2019      Assessment & Plan:   Problem List Items Addressed This Visit   None Visit Diagnoses     Upper respiratory tract infection, unspecified type    -  Primary   Relevant Medications   benzonatate (TESSALON) 100 MG capsule   amoxicillin-clavulanate  (AUGMENTIN) 875-125 MG tablet       Meds ordered this encounter  Medications   benzonatate (  TESSALON) 100 MG capsule    Sig: Take 1 capsule (100 mg total) by mouth 3 (three) times daily as needed for up to 10 days for cough. Never suck or chew on a benzonatate capsule.    Dispense:  30 capsule    Refill:  0    Do not place medication on "Automatic Refill". Please provide patient with medication counseling and recommendations.    Order Specific Question:   Supervising Provider    Answer:   Tresa Garter [[id number]]   amoxicillin-clavulanate (AUGMENTIN) 875-125 MG tablet    Sig: Take 1 tablet by mouth 2 (two) times daily for 7 days.    Dispense:  14 tablet    Refill:  0    Order Specific Question:   Supervising Provider    Answer:   Tresa Garter [[id number]]    Follow-up: Return for Appointment As Scheduled.    Vevelyn Francois, NP

## 2020-12-15 NOTE — Patient Instructions (Signed)
Upper Respiratory Infection, Adult  An upper respiratory infection (URI) affects the nose, throat, and upper air passages. URIs are caused by germs (viruses). The most common type of URI is often called "the common cold."  Medicines cannot cure URIs, but you can do things at home to relieve your symptoms. URIs usually get better within 7-10 days.  Follow these instructions at home:  Activity  Rest as needed.  If you have a fever, stay home from work or school until your fever is gone, or until your doctor says you may return to work or school.  You should stay home until you cannot spread the infection anymore (you are not contagious).  Your doctor may have you wear a face mask so you have less risk of spreading the infection.  Relieving symptoms  Gargle with a salt-water mixture 3-4 times a day or as needed. To make a salt-water mixture, completely dissolve -1 tsp of salt in 1 cup of warm water.  Use a cool-mist humidifier to add moisture to the air. This can help you breathe more easily.  Eating and drinking    Drink enough fluid to keep your pee (urine) pale yellow.  Eat soups and other clear broths.  General instructions    Take over-the-counter and prescription medicines only as told by your doctor. These include cold medicines, fever reducers, and cough suppressants.  Do not use any products that contain nicotine or tobacco. These include cigarettes and e-cigarettes. If you need help quitting, ask your doctor.  Avoid being where people are smoking (avoid secondhand smoke).  Make sure you get regular shots and get the flu shot every year.  Keep all follow-up visits as told by your doctor. This is important.  How to avoid spreading infection to others    Wash your hands often with soap and water. If you do not have soap and water, use hand sanitizer.  Avoid touching your mouth, face, eyes, or nose.  Cough or sneeze into a tissue or your sleeve or elbow. Do not cough or sneeze into your hand or into the  air.  Contact a doctor if:  You are getting worse, not better.  You have any of these:  A fever.  Chills.  Brown or red mucus in your nose.  Yellow or brown fluid (discharge)coming from your nose.  Pain in your face, especially when you bend forward.  Swollen neck glands.  Pain with swallowing.  White areas in the back of your throat.  Get help right away if:  You have shortness of breath that gets worse.  You have very bad or constant:  Headache.  Ear pain.  Pain in your forehead, behind your eyes, and over your cheekbones (sinus pain).  Chest pain.  You have long-lasting (chronic) lung disease along with any of these:  Wheezing.  Long-lasting cough.  Coughing up blood.  A change in your usual mucus.  You have a stiff neck.  You have changes in your:  Vision.  Hearing.  Thinking.  Mood.  Summary  An upper respiratory infection (URI) is caused by a germ called a virus. The most common type of URI is often called "the common cold."  URIs usually get better within 7-10 days.  Take over-the-counter and prescription medicines only as told by your doctor.  This information is not intended to replace advice given to you by your health care provider. Make sure you discuss any questions you have with your health care provider.  Document 

## 2020-12-16 ENCOUNTER — Encounter: Payer: Self-pay | Admitting: Nurse Practitioner

## 2021-01-09 ENCOUNTER — Encounter: Payer: Self-pay | Admitting: Nurse Practitioner

## 2021-01-09 ENCOUNTER — Ambulatory Visit (INDEPENDENT_AMBULATORY_CARE_PROVIDER_SITE_OTHER): Payer: Medicare HMO | Admitting: Nurse Practitioner

## 2021-01-09 ENCOUNTER — Other Ambulatory Visit: Payer: Self-pay

## 2021-01-09 VITALS — BP 148/77 | HR 60 | Temp 97.4°F | Ht 60.0 in | Wt 172.0 lb

## 2021-01-09 DIAGNOSIS — R21 Rash and other nonspecific skin eruption: Secondary | ICD-10-CM

## 2021-01-09 DIAGNOSIS — B029 Zoster without complications: Secondary | ICD-10-CM | POA: Diagnosis not present

## 2021-01-09 MED ORDER — GABAPENTIN 300 MG PO CAPS: 300.0000 mg | ORAL_CAPSULE | Freq: Two times a day (BID) | ORAL | 0 refills | Status: DC

## 2021-01-09 MED ORDER — VALACYCLOVIR HCL 1 G PO TABS: 1000.0000 mg | ORAL_TABLET | Freq: Three times a day (TID) | ORAL | 0 refills | Status: AC

## 2021-01-09 MED ORDER — HYDROXYZINE HCL 10 MG PO TABS: 10.0000 mg | ORAL_TABLET | Freq: Three times a day (TID) | ORAL | 0 refills | Status: AC | PRN

## 2021-01-09 NOTE — Patient Instructions (Addendum)
You were seen today in the [facility] for shingles.  You were prescribed medications, please take as directed. Please maintain upcoming follow up with PCP. Discontinue Acyclovir until completion of therapy for shingles. Once therapy for shingles completed, restart acyclovir. Calamine lotion may assist with itching, in addition to oatmeal bath.

## 2021-01-09 NOTE — Progress Notes (Signed)
[place] [place], [place]  [address] Phone:  [phone]   Fax:  [phone] Subjective:   Patient ID: Christy Price, female    DOB: 12-Sep-1970, 50 y.o.   MRN: [id number]  Chief Complaint  Patient presents with   Acute Visit    Possible shingles under left breast and onto the back. Tingling and burning. Has been using [facility] brand pain and itch relief, Acyclovir.   HPI Christy Price 50 y.o. female  has a past medical history of Cancer [facility]), Hypertension, and Meningitis. To the [facility] for rash. Patient states that she has had painful rash with itching x 1 day. Has been using OTC medications with no improvement in symptoms. Currently rates pain 8/10 and describes as burning. Denies any fever or sick contacts. Endorses history of chicken pox in childhood. Verbalizes being very sweaty and sticky last week and is not certain if this attributed to symptoms. States that her mother suspects she has shingles. Denies any other symptoms today.  Denies any fatigue, chest pain, shortness of breath, HA or dizziness. Denies any blurred vision, numbness or tingling.    Past Medical History:  Diagnosis Date   Cancer ([place])     hx cervical cx    Hypertension    Meningitis     Past Surgical History:  Procedure Laterality Date   SHUNT REPLACEMENT      Family History  Problem Relation Age of Onset   Breast cancer Sister     Social History   Socioeconomic History   Marital status: Married    Spouse name: Not on file   Number of children: Not on file   Years of education: Not on file   Highest education level: Not on file  Occupational History   Not on file  Tobacco Use   Smoking status: Every Day    Packs/day: 1.50    Types: Cigarettes   Smokeless tobacco: Never  Vaping Use   Vaping Use: Never used  Substance and Sexual Activity   Alcohol use: Yes    Alcohol/week: 0.0 standard drinks    Comment: Social   Drug use: No    Sexual activity: Not Currently  Other Topics Concern   Not on file  Social History Narrative   Not on file   Social Determinants of Health   Financial Resource Strain: Not on file  Food Insecurity: Not on file  Transportation Needs: Not on file  Physical Activity: Not on file  Stress: Not on file  Social Connections: Not on file  Intimate Partner Violence: Not on file    Outpatient Medications Prior to Visit  Medication Sig Dispense Refill   Blood Pressure Monitor MISC Use as directed daily 1 each 0   celecoxib (CELEBREX) 200 MG capsule Take 1 capsule (200 mg total) by mouth 2 (two) times daily. 180 capsule 3   Cholecalciferol (D3 PO) Take by mouth. (Patient not taking: No sig reported)     cyclobenzaprine (FLEXERIL) 10 MG tablet Take 1 tablet (10 mg total) by mouth 3 (three) times daily as needed for muscle spasms. 30 tablet 6   furosemide (LASIX) 20 MG tablet Take 1 tablet (20 mg total) by mouth daily. 90 tablet 3   hydrochlorothiazide (HYDRODIURIL) 25 MG tablet Take 1 tablet (25 mg total) by mouth daily. 90 tablet 3   lisinopril (ZESTRIL) 10 MG tablet Take 1 tablet (10 mg total) by mouth daily. 90 tablet 3   terbinafine (  LAMISIL) 250 MG tablet Take 1 tablet (250 mg total) by mouth daily. 90 tablet 1   acyclovir (ZOVIRAX) 200 MG capsule Take 2 capsules (400 mg total) by mouth 2 (two) times daily. 60 capsule 6   No facility-administered medications prior to visit.    Allergies  Allergen Reactions   Oxycodone    Rifampin     Review of Systems  Constitutional:  Negative for chills, fever and malaise/fatigue.  HENT: Negative.    Eyes: Negative.   Respiratory:  Negative for cough and shortness of breath.   Cardiovascular:  Negative for chest pain, palpitations and leg swelling.  Gastrointestinal:  Negative for abdominal pain, blood in stool, constipation, diarrhea, nausea and vomiting.  Skin:  Positive for itching and rash.  Neurological: Negative.    Psychiatric/Behavioral:  Negative for depression. The patient is not nervous/anxious.   All other systems reviewed and are negative.     Objective:    Physical Exam Vitals reviewed.  Constitutional:      General: She is not in acute distress.    Appearance: Normal appearance.  HENT:     Head: Normocephalic.  Cardiovascular:     Rate and Rhythm: Normal rate and regular rhythm.     Pulses: Normal pulses.     Heart sounds: Normal heart sounds.     Comments: No obvious peripheral edema Pulmonary:     Effort: Pulmonary effort is normal.     Breath sounds: Normal breath sounds.  Skin:    General: Skin is warm and dry.     Capillary Refill: Capillary refill takes less than 2 seconds.     Findings: Erythema and rash present.     Comments: Rash noted with erythema and blisters along left thoracic dermatome, non tender to palpation   Neurological:     General: No focal deficit present.     Mental Status: She is alert and oriented to person, place, and time.  Psychiatric:        Mood and Affect: Mood normal.        Behavior: Behavior normal.        Thought Content: Thought content normal.        Judgment: Judgment normal.    BP (!) 148/77   Pulse 60   Temp (!) 97.4 F (36.3 C)   Ht 5' (1.524 m)   Wt 172 lb 0.6 oz (78 kg)   SpO2 100%   BMI 33.60 kg/m  Wt Readings from Last 3 Encounters:  01/09/21 172 lb 0.6 oz (78 kg)  12/15/20 170 lb (77.1 kg)  11/29/20 174 lb 3.2 oz (79 kg)    Immunization History  Administered Date(s) Administered   Influenza,inj,Quad PF,6+ Mos 11/04/2018, 12/28/2019   PFIZER(Purple Top)SARS-COV-2 Vaccination 06/10/2019, 07/06/2019, 04/07/2020   Tdap 04/09/2017    Diabetic Foot Exam - Simple   No data filed     Lab Results  Component Value Date   TSH 1.830 11/05/2019   Lab Results  Component Value Date   WBC 8.5 11/05/2019   HGB 16.1 (H) 11/05/2019   HCT 45.6 11/05/2019   MCV 94 11/05/2019   PLT 310 11/05/2019   Lab Results   Component Value Date   NA 140 10/23/2020   K 3.9 10/23/2020   CO2 24 11/05/2019   GLUCOSE 107 (H) 10/23/2020   BUN 9 10/23/2020   CREATININE 0.66 10/23/2020   BILITOT 0.3 10/23/2020   ALKPHOS 100 10/23/2020   AST 18 10/23/2020   ALT 12 11/05/2019  PROT 7.3 10/23/2020   ALBUMIN 5.0 (H) 10/23/2020   CALCIUM 9.9 10/23/2020   EGFR 106 10/23/2020   Lab Results  Component Value Date   CHOL 181 11/05/2019   CHOL 176 11/04/2018   CHOL 147 11/02/2014   Lab Results  Component Value Date   HDL 61 11/05/2019   HDL 63 11/04/2018   HDL 46 11/02/2014   Lab Results  Component Value Date   LDLCALC 101 (H) 11/05/2019   LDLCALC 99 11/04/2018   LDLCALC 82 11/02/2014   Lab Results  Component Value Date   TRIG 106 11/05/2019   TRIG 71 11/04/2018   TRIG 96 11/02/2014   Lab Results  Component Value Date   CHOLHDL 3.0 11/05/2019   CHOLHDL 2.8 11/04/2018   CHOLHDL 3.2 11/02/2014   Lab Results  Component Value Date   HGBA1C 4.8 05/07/2019   HGBA1C 4.7 10/07/2017   HGBA1C 4.6 08/22/2016       Assessment & Plan:   Problem List Items Addressed This Visit       Musculoskeletal and Integument   Rash - Primary   Other Visit Diagnoses     Herpes zoster without complication       Relevant Medications   valACYclovir (VALTREX) 1000 MG tablet   hydrOXYzine (ATARAX/VISTARIL) 10 MG tablet   gabapentin (NEURONTIN) 300 MG capsule Discontinued regular dosage of acyclovir. Patient informed to restart after completion of treatment for shingles. Discussed at length non pharmacological methods for symptom management Patient informed of the contagious nature of shingles. Currently at a rehab facility, work note provided until completion of treatment.    Maintain upcoming follow up with PCP, sooner as needed    I have discontinued Zayden R. Stangl "[name]"'s acyclovir. I am also having her start on valACYclovir, hydrOXYzine, and gabapentin. Additionally, I am having her maintain her  celecoxib, lisinopril, Cholecalciferol (D3 PO), terbinafine, cyclobenzaprine, furosemide, hydrochlorothiazide, and Blood Pressure Monitor.  Meds ordered this encounter  Medications   valACYclovir (VALTREX) 1000 MG tablet    Sig: Take 1 tablet (1,000 mg total) by mouth 3 (three) times daily for 7 days.    Dispense:  21 tablet    Refill:  0   hydrOXYzine (ATARAX/VISTARIL) 10 MG tablet    Sig: Take 1 tablet (10 mg total) by mouth 3 (three) times daily as needed.    Dispense:  30 tablet    Refill:  0   gabapentin (NEURONTIN) 300 MG capsule    Sig: Take 1 capsule (300 mg total) by mouth 2 (two) times daily for 10 days.    Dispense:  20 capsule    Refill:  0     Teena Dunk, NP

## 2021-04-25 ENCOUNTER — Ambulatory Visit: Payer: Medicare HMO | Admitting: Nurse Practitioner

## 2021-04-26 ENCOUNTER — Encounter: Payer: Self-pay | Admitting: Nurse Practitioner

## 2021-04-26 ENCOUNTER — Other Ambulatory Visit: Payer: Self-pay

## 2021-04-26 ENCOUNTER — Ambulatory Visit (INDEPENDENT_AMBULATORY_CARE_PROVIDER_SITE_OTHER): Payer: Medicare HMO | Admitting: Nurse Practitioner

## 2021-04-26 VITALS — BP 151/68 | HR 69 | Temp 97.4°F | Ht 60.0 in | Wt 165.8 lb

## 2021-04-26 DIAGNOSIS — M65342 Trigger finger, left ring finger: Secondary | ICD-10-CM

## 2021-04-26 DIAGNOSIS — M67432 Ganglion, left wrist: Secondary | ICD-10-CM | POA: Diagnosis not present

## 2021-04-26 DIAGNOSIS — M25562 Pain in left knee: Secondary | ICD-10-CM | POA: Diagnosis not present

## 2021-04-26 DIAGNOSIS — T148XXA Other injury of unspecified body region, initial encounter: Secondary | ICD-10-CM

## 2021-04-26 DIAGNOSIS — E785 Hyperlipidemia, unspecified: Secondary | ICD-10-CM

## 2021-04-26 DIAGNOSIS — M25561 Pain in right knee: Secondary | ICD-10-CM

## 2021-04-26 DIAGNOSIS — G8929 Other chronic pain: Secondary | ICD-10-CM | POA: Diagnosis not present

## 2021-04-26 DIAGNOSIS — I10 Essential (primary) hypertension: Secondary | ICD-10-CM

## 2021-04-26 NOTE — Progress Notes (Signed)
[name], [name]  [id number] Phone:  [phone]   Fax:  [phone]   Established Patient Office Visit  Subjective:  Patient ID: Christy Price, female    DOB: Mar 29, 1970  Age: 51 y.o. 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 Pt states that she thinks it maybe trigger finger. Pt also states that she has a knot on the inside of her left wrist.     HPI Christy Price presents for follow up. She  has a past medical history of Cancer ([place]), Hypertension, and Meningitis.   [name] is in today for follow up for Hypertension. The current prescribed treatment is HCTZ 25 mg and Lisinopril 10 mg.  Compliance is reported. The  DASH diet is being followed. An exercise regimen not ongoing. She works. There is a goal to BP control and weight loss. Denies headache, dizziness, visual changes, shortness of breath, dyspnea on exertion, chest pain, nausea, vomiting or any edema.   Trigger Finger Patient complaints of left Ring finger DIP  dislocation catching. This has been present several minutes however recently has become progressively more aggravating. This affects most every activity involving gripping, including sweeping, mopping pushing cart; all activities involved with her job. She has to use the spray bottle and this also causes aggregation. She is working more; 2 days 4 hours per day. Originally only a catching sensation was present, however now locking is present. She presents today for evaluation  Treatment to date has been occasional NSAID's, without significant relief. She is not able to do her job. She has noticed this in the last month or so. She has had 3 surgeries on hands. She has a history of fracture to the  right hand. She had a cyst to her wrist. She had left hand surgery (tendon). She does not feel like it will affect her communication with sign language.  She questioned if there is a brace.  .   Past Medical History:  Diagnosis Date   Cancer ([place])     hx cervical cx    Hypertension    Meningitis     Past Surgical History:  Procedure Laterality Date   SHUNT REPLACEMENT      Family History  Problem Relation Age of Onset   Breast cancer Sister     Social History   Socioeconomic History   Marital status: Married    Spouse name: Not on file   Number of children: Not on file   Years of education: Not on file   Highest education level: Not on file  Occupational History   Not on file  Tobacco Use   Smoking status: Every Day    Packs/day: 1.50    Types: Cigarettes   Smokeless tobacco: Never   Tobacco comments:    1 pack a week.  Vaping Use   Vaping Use: Never used  Substance and Sexual Activity   Alcohol use: Yes    Alcohol/week: 0.0 standard drinks    Comment: Social   Drug use: No   Sexual activity: Not Currently  Other Topics Concern   Not on file  Social History Narrative   Not on file   Social  Determinants of Health   Financial Resource Strain: Not on file  Food Insecurity: Not on file  Transportation Needs: Not on file  Physical Activity: Not on file  Stress: Not on file  Social Connections: Not on file  Intimate Partner Violence: Not on file    Outpatient Medications Prior to Visit  Medication Sig Dispense Refill   Blood Pressure Monitor MISC Use as directed daily 1 each 0   celecoxib (CELEBREX) 200 MG capsule Take 1 capsule (200 mg total) by mouth 2 (two) times daily. 180 capsule 3   cyclobenzaprine (FLEXERIL) 10 MG tablet Take 1 tablet (10 mg total) by mouth 3 (three) times daily as needed for muscle spasms. 30 tablet 6   furosemide (LASIX) 20 MG tablet Take 1 tablet (20 mg total) by mouth daily. 90 tablet 3   hydrochlorothiazide (HYDRODIURIL) 25 MG  tablet Take 1 tablet (25 mg total) by mouth daily. 90 tablet 3   hydrOXYzine (ATARAX/VISTARIL) 10 MG tablet Take 1 tablet (10 mg total) by mouth 3 (three) times daily as needed. 30 tablet 0   lisinopril (ZESTRIL) 10 MG tablet Take 1 tablet (10 mg total) by mouth daily. 90 tablet 3   terbinafine (LAMISIL) 250 MG tablet Take 1 tablet (250 mg total) by mouth daily. 90 tablet 1   Cholecalciferol (D3 PO) Take by mouth. (Patient not taking: Reported on 10/23/2020)     gabapentin (NEURONTIN) 300 MG capsule Take 1 capsule (300 mg total) by mouth 2 (two) times daily for 10 days. 20 capsule 0   No facility-administered medications prior to visit.    Allergies  Allergen Reactions   Oxycodone    Rifampin     ROS Review of Systems    Objective:    Physical Exam Constitutional:      Appearance: She is obese.  HENT:     Head: Normocephalic and atraumatic.  Cardiovascular:     Rate and Rhythm: Normal rate and regular rhythm.     Pulses: Normal pulses.     Heart sounds: Normal heart sounds.  Pulmonary:     Effort: Pulmonary effort is normal.     Breath sounds: Normal breath sounds.  Abdominal:     Palpations: Abdomen is soft.     Comments: Increased abdominal girth   Musculoskeletal:        General: Normal range of motion.     Right hand: No swelling or deformity. Normal range of motion.     Left hand: No swelling or deformity. Normal range of motion.     Cervical back: Normal range of motion.     Comments: Less than 5th digit tip Cyst to left wrist    Skin:    General: Skin is warm.     Capillary Refill: Capillary refill takes less than 2 seconds.     Findings: Bruising (arms) present.  Neurological:     General: No focal deficit present.     Mental Status: She is alert and oriented to person, place, and time.  Psychiatric:        Mood and Affect: Mood normal.        Behavior: Behavior normal.        Thought Content: Thought content normal.        Judgment: Judgment normal.     BP (!) 151/68    Pulse 69    Temp (!) 97.4 F (36.3 C)    Ht 5' (1.524 m)    Wt 165 lb 12.8 oz (75.2  kg)    SpO2 99%    BMI 32.38 kg/m  Wt Readings from Last 3 Encounters:  04/26/21 165 lb 12.8 oz (75.2 kg)  01/09/21 172 lb 0.6 oz (78 kg)  12/15/20 170 lb (77.1 kg)     Health Maintenance Due  Topic Date Due   Zoster Vaccines- Shingrix (1 of 2) Never done   COVID-19 Vaccine (4 - Booster for Pfizer series) 06/02/2020    There are no preventive care reminders to display for this patient.  Lab Results  Component Value Date   TSH 1.830 11/05/2019   Lab Results  Component Value Date   WBC 8.5 11/05/2019   HGB 16.1 (H) 11/05/2019   HCT 45.6 11/05/2019   MCV 94 11/05/2019   PLT 310 11/05/2019   Lab Results  Component Value Date   NA 140 10/23/2020   K 3.9 10/23/2020   CO2 24 11/05/2019   GLUCOSE 107 (H) 10/23/2020   BUN 9 10/23/2020   CREATININE 0.66 10/23/2020   BILITOT 0.3 10/23/2020   ALKPHOS 100 10/23/2020   AST 18 10/23/2020   ALT 12 11/05/2019   PROT 7.3 10/23/2020   ALBUMIN 5.0 (H) 10/23/2020   CALCIUM 9.9 10/23/2020   EGFR 106 10/23/2020   Lab Results  Component Value Date   CHOL 181 11/05/2019   Lab Results  Component Value Date   HDL 61 11/05/2019   Lab Results  Component Value Date   LDLCALC 101 (H) 11/05/2019   Lab Results  Component Value Date   TRIG 106 11/05/2019   Lab Results  Component Value Date   CHOLHDL 3.0 11/05/2019   Lab Results  Component Value Date   HGBA1C 4.8 05/07/2019      Assessment & Plan:   Problem List Items Addressed This Visit       Cardiovascular and Mediastinum   Essential hypertension - Primary Stable  Encouraged on going compliance with current medication regimen Encouraged home monitoring and recording BP <130/80 Eating a heart-healthy diet with less salt Encouraged regular physical activity  Recommend Weight loss     Relevant Orders   Comp. Metabolic Panel (12)   Other Visit Diagnoses    Trigger ring finger of left hand Ambulatory referral to Hand Surgery Encouraged brace  Avoid overworking  Ganglion cyst of wrist, left     Stable    Relevant Orders   Ambulatory referral to Hand Surgery   Chronic pain of both knees     Persistent    Hyperlipidemia, unspecified hyperlipidemia type     Continue with current regimen.  No changes warranted. Good patient compliance.    Relevant Orders   Lipid panel   Bruising     Recurrent    Relevant Orders   CBC with Differential/Platelet       No orders of the defined types were placed in this encounter.   Follow-up: Return in about 6 months (around 10/24/2021) for fasting labs within the next week.    Vevelyn Francois, NP

## 2021-04-26 NOTE — Patient Instructions (Signed)
Managing Your Hypertension Hypertension, also called high blood pressure, is when the force of the blood pressing against the walls of the arteries is too strong. Arteries are blood vessels that carry blood from your heart throughout your body. Hypertension forces the heart to work harder to pump blood and may cause the arteries to become narrow or stiff. Understanding blood pressure readings Your personal target blood pressure may vary depending on your medical conditions, your age, and other factors. A blood pressure reading includes a higher number over a lower number. Ideally, your blood pressure should be below 120/80. You should know that: The first, or top, number is called the systolic pressure. It is a measure of the pressure in your arteries as your heart beats. The second, or bottom number, is called the diastolic pressure. It is a measure of the pressure in your arteries as the heart relaxes. Blood pressure is classified into four stages. Based on your blood pressure reading, your health care provider may use the following stages to determine what type of treatment you need, if any. Systolic pressure and diastolic pressure are measured in a unit called mmHg. Normal Systolic pressure: below 373. Diastolic pressure: below 80. Elevated Systolic pressure: 428-768. Diastolic pressure: below 80. Hypertension stage 1 Systolic pressure: 115-726. Diastolic pressure: 20-35. Hypertension stage 2 Systolic pressure: 597 or above. Diastolic pressure: 90 or above. How can this condition affect me? Managing your hypertension is an important responsibility. Over time, hypertension can damage the arteries and decrease blood flow to important parts of the body, including the brain, heart, and kidneys. Having untreated or uncontrolled hypertension can lead to: A heart attack. A stroke. A weakened blood vessel (aneurysm). Heart failure. Kidney damage. Eye damage. Metabolic syndrome. Memory and  concentration problems. Vascular dementia. What actions can I take to manage this condition? Hypertension can be managed by making lifestyle changes and possibly by taking medicines. Your health care provider will help you make a plan to bring your blood pressure within a normal range. Nutrition  Eat a diet that is high in fiber and potassium, and low in salt (sodium), added sugar, and fat. An example eating plan is called the Dietary Approaches to Stop Hypertension (DASH) diet. To eat this way: Eat plenty of fresh fruits and vegetables. Try to fill one-half of your plate at each meal with fruits and vegetables. Eat whole grains, such as whole-wheat pasta, brown rice, or whole-grain bread. Fill about one-fourth of your plate with whole grains. Eat low-fat dairy products. Avoid fatty cuts of meat, processed or cured meats, and poultry with skin. Fill about one-fourth of your plate with lean proteins such as fish, chicken without skin, beans, eggs, and tofu. Avoid pre-made and processed foods. These tend to be higher in sodium, added sugar, and fat. Reduce your daily sodium intake. Most people with hypertension should eat less than 1,500 mg of sodium a day. Lifestyle  Work with your health care provider to maintain a healthy body weight or to lose weight. Ask what an ideal weight is for you. Get at least 30 minutes of exercise that causes your heart to beat faster (aerobic exercise) most days of the week. Activities may include walking, swimming, or biking. Include exercise to strengthen your muscles (resistance exercise), such as weight lifting, as part of your weekly exercise routine. Try to do these types of exercises for 30 minutes at least 3 days a week. Do not use any products that contain nicotine or tobacco, such as cigarettes, e-cigarettes,  and chewing tobacco. If you need help quitting, ask your health care provider. Control any long-term (chronic) conditions you have, such as high  cholesterol or diabetes. Identify your sources of stress and find ways to manage stress. This may include meditation, deep breathing, or making time for fun activities. Alcohol use Do not drink alcohol if: Your health care provider tells you not to drink. You are pregnant, may be pregnant, or are planning to become pregnant. If you drink alcohol: Limit how much you use to: 0-1 drink a day for women. 0-2 drinks a day for men. Be aware of how much alcohol is in your drink. In the [place] one drink equals one 12 oz bottle of beer (355 mL), one 5 oz glass of wine (148 mL), or one 1 oz glass of hard liquor (44 mL). Medicines Your health care provider may prescribe medicine if lifestyle changes are not enough to get your blood pressure under control and if: Your systolic blood pressure is 130 or higher. Your diastolic blood pressure is 80 or higher. Take medicines only as told by your health care provider. Follow the directions carefully. Blood pressure medicines must be taken as told by your health care provider. The medicine does not work as well when you skip doses. Skipping doses also puts you at risk for problems. Monitoring Before you monitor your blood pressure: Do not smoke, drink caffeinated beverages, or exercise within 30 minutes before taking a measurement. Use the bathroom and empty your bladder (urinate). Sit quietly for at least 5 minutes before taking measurements. Monitor your blood pressure at home as told by your health care provider. To do this: Sit with your back straight and supported. Place your feet flat on the floor. Do not cross your legs. Support your arm on a flat surface, such as a table. Make sure your upper arm is at heart level. Each time you measure, take two or three readings one minute apart and record the results. You may also need to have your blood pressure checked regularly by your health care provider. General information Talk with your health care  provider about your diet, exercise habits, and other lifestyle factors that may be contributing to hypertension. Review all the medicines you take with your health care provider because there may be side effects or interactions. Keep all visits as told by your health care provider. Your health care provider can help you create and adjust your plan for managing your high blood pressure. Where to find more information National Heart, Lung, and Blood Institute: https://wilson-eaton.com/ American Heart Association: www.heart.org Contact a health care provider if: You think you are having a reaction to medicines you have taken. You have repeated (recurrent) headaches. You feel dizzy. You have swelling in your ankles. You have trouble with your vision. Get help right away if: You develop a severe headache or confusion. You have unusual weakness or numbness, or you feel faint. You have severe pain in your chest or abdomen. You vomit repeatedly. You have trouble breathing. These symptoms may represent a serious problem that is an emergency. Do not wait to see if the symptoms will go away. Get medical help right away. Call your local emergency services (911 in the [place] Do not drive yourself to the hospital. Summary Hypertension is when the force of blood pumping through your arteries is too strong. If this condition is not controlled, it may put you at risk for serious complications. Your personal target blood pressure may vary depending on  your medical conditions, your age, and other factors. For most people, a normal blood pressure is less than 120/80. Hypertension is managed by lifestyle changes, medicines, or both. Lifestyle changes to help manage hypertension include losing weight, eating a healthy, low-sodium diet, exercising more, stopping smoking, and limiting alcohol. This information is not intended to replace advice given to you by your health care provider. Make sure you discuss any questions  you have with your health care provider. Document Revised: 03/15/2019 Document Reviewed: 01/26/2019 Elsevier Patient Education  2022 [place].   Trigger Finger Trigger finger, also called stenosing tenosynovitis,  is a condition that causes a finger to get stuck in a bent position. Each finger has a tendon, which is a tough, cord-like tissue that connects muscle to bone, and each tendon passes through a tunnel of tissue called a tendon sheath. To move your finger, your tendon needs to glide freely through the sheath. Trigger finger happens when the tendon or the sheath thickens, making it difficult to move your finger. Trigger finger can affect any finger or a thumb. It may affect more than one finger. Mild cases may clear up with rest and medicine. Severe cases require more treatment. What are the causes? Trigger finger is caused by a thickened finger tendon or tendon sheath. The cause of this thickening is not known. What increases the risk? The following factors may make you more likely to develop this condition: Doing activities that require a strong grip. Having rheumatoid arthritis, gout, or diabetes. Being 43-101 years old. Being female. What are the signs or symptoms? Symptoms of this condition include: Pain when bending or straightening your finger. Tenderness or swelling where your finger attaches to the palm of your hand. A lump in the palm of your hand or on the inside of your finger. Hearing a noise like a pop or a snap when you try to straighten your finger. Feeling a catching or locking sensation when you try to straighten your finger. Being unable to straighten your finger. How is this diagnosed? This condition is diagnosed based on your symptoms and a physical exam. How is this treated? This condition may be treated by: Resting your finger and avoiding activities that make symptoms worse. Wearing a finger splint to keep your finger extended. Taking NSAIDs, such as  ibuprofen, to relieve pain and swelling. Doing gentle exercises to stretch the finger as told by your health care provider. Having medicine that reduces swelling and inflammation (steroids) injected into the tendon sheath. Injections may need to be repeated. Having surgery to open the tendon sheath. This may be done if other treatments do not work and you cannot straighten your finger. You may need physical therapy after surgery. Follow these instructions at home: If you have a splint: Wear the splint as told by your health care provider. Remove it only as told by your health care provider. Loosen it if your fingers tingle, become numb, or turn cold and blue. Keep it clean. If the splint is not waterproof: Do not let it get wet. Cover it with a watertight covering when you take a bath or shower. Managing pain, stiffness, and swelling   If directed, apply heat to the affected area as often as told by your health care provider. Use the heat source that your health care provider recommends, such as a moist heat pack or a heating pad. Place a towel between your skin and the heat source. Leave the heat on for 20-30 minutes. Remove the heat  if your skin turns bright red. This is especially important if you are unable to feel pain, heat, or cold. You may have a greater risk of getting burned. If directed, put ice on the painful area. To do this: If you have a removable splint, remove it as told by your health care provider. Put ice in a plastic bag. Place a towel between your skin and the bag or between your splint and the bag. Leave the ice on for 20 minutes, 2-3 times a day.  Activity Rest your finger as told by your health care provider. Avoid activities that make the pain worse. Return to your normal activities as told by your health care provider. Ask your health care provider what activities are safe for you. Do exercises as told by your health care provider. Ask your health care provider  when it is safe to drive if you have a splint on your hand. General instructions Take over-the-counter and prescription medicines only as told by your health care provider. Keep all follow-up visits as told by your health care provider. This is important. Contact a health care provider if: Your symptoms are not improving with home care. Summary Trigger finger, also called stenosing tenosynovitis, causes your finger to get stuck in a bent position. This can make it difficult and painful to straighten your finger. This condition develops when a finger tendon or tendon sheath thickens. Treatment may include resting your finger, wearing a splint, and taking medicines. In severe cases, surgery to open the tendon sheath may be needed. This information is not intended to replace advice given to you by your health care provider. Make sure you discuss any questions you have with your health care provider. Document Revised: 07/13/2018 Document Reviewed: 07/13/2018 Elsevier Patient Education  [place].

## 2021-04-28 ENCOUNTER — Encounter: Payer: Self-pay | Admitting: Nurse Practitioner

## 2021-04-30 DIAGNOSIS — E785 Hyperlipidemia, unspecified: Secondary | ICD-10-CM | POA: Diagnosis not present

## 2021-04-30 DIAGNOSIS — I10 Essential (primary) hypertension: Secondary | ICD-10-CM | POA: Diagnosis not present

## 2021-04-30 DIAGNOSIS — T148XXA Other injury of unspecified body region, initial encounter: Secondary | ICD-10-CM | POA: Diagnosis not present

## 2021-05-01 LAB — COMP. METABOLIC PANEL (12)
AST: 16 IU/L (ref 0–40)
Albumin/Globulin Ratio: 2.6 — ABNORMAL HIGH (ref 1.2–2.2)
Albumin: 4.9 g/dL (ref 3.8–4.9)
Alkaline Phosphatase: 92 IU/L (ref 44–121)
BUN/Creatinine Ratio: 17 (ref 9–23)
BUN: 11 mg/dL (ref 6–24)
Bilirubin Total: <0.2 mg/dL (ref 0.0–1.2)
Calcium: 10 mg/dL (ref 8.7–10.2)
Chloride: 101 mmol/L (ref 96–106)
Creatinine, Ser: 0.66 mg/dL (ref 0.57–1.00)
Globulin, Total: 1.9 g/dL (ref 1.5–4.5)
Glucose: 97 mg/dL (ref 70–99)
Potassium: 4.1 mmol/L (ref 3.5–5.2)
Sodium: 146 mmol/L — ABNORMAL HIGH (ref 134–144)
Total Protein: 6.8 g/dL (ref 6.0–8.5)
eGFR: 105 mL/min/{1.73_m2} (ref >59)

## 2021-05-01 LAB — CBC WITH DIFFERENTIAL/PLATELET
Basophils Absolute: 0.1 10*3/uL (ref 0.0–0.2)
Basos: 1 %
EOS (ABSOLUTE): 0.1 10*3/uL (ref 0.0–0.4)
Eos: 2 %
Hematocrit: 45.5 % (ref 34.0–46.6)
Hemoglobin: 15.1 g/dL (ref 11.1–15.9)
Immature Grans (Abs): 0.2 10*3/uL — ABNORMAL HIGH (ref 0.0–0.1)
Immature Granulocytes: 2 %
Lymphocytes Absolute: 2.8 10*3/uL (ref 0.7–3.1)
Lymphs: 35 %
MCH: 31.5 pg (ref 26.6–33.0)
MCHC: 33.2 g/dL (ref 31.5–35.7)
MCV: 95 fL (ref 79–97)
Monocytes Absolute: 0.6 10*3/uL (ref 0.1–0.9)
Monocytes: 7 %
Neutrophils Absolute: 4.2 10*3/uL (ref 1.4–7.0)
Neutrophils: 53 %
Platelets: 307 10*3/uL (ref 150–450)
RBC: 4.79 x10E6/uL (ref 3.77–5.28)
RDW: 11.7 % (ref 11.7–15.4)
WBC: 7.9 10*3/uL (ref 3.4–10.8)

## 2021-05-01 LAB — LIPID PANEL
Chol/HDL Ratio: 2.9 ratio (ref 0.0–4.4)
Cholesterol, Total: 185 mg/dL (ref 100–199)
HDL: 64 mg/dL (ref >39)
LDL Chol Calc (NIH): 99 mg/dL (ref 0–99)
Triglycerides: 128 mg/dL (ref 0–149)
VLDL Cholesterol Cal: 22 mg/dL (ref 5–40)

## 2021-06-13 DIAGNOSIS — H5203 Hypermetropia, bilateral: Secondary | ICD-10-CM | POA: Diagnosis not present

## 2021-07-02 DIAGNOSIS — H524 Presbyopia: Secondary | ICD-10-CM | POA: Diagnosis not present

## 2021-08-03 DIAGNOSIS — M79671 Pain in right foot: Secondary | ICD-10-CM | POA: Diagnosis not present

## 2021-08-03 DIAGNOSIS — M7661 Achilles tendinitis, right leg: Secondary | ICD-10-CM | POA: Diagnosis not present

## 2021-08-10 DIAGNOSIS — R197 Diarrhea, unspecified: Secondary | ICD-10-CM | POA: Diagnosis not present

## 2021-08-17 ENCOUNTER — Telehealth: Payer: Self-pay | Admitting: Nurse Practitioner

## 2021-08-17 NOTE — Telephone Encounter (Signed)
Please schedule her for a visit in the next couple of weeks and we will order this. Thanks.

## 2021-08-17 NOTE — Telephone Encounter (Signed)
Patient left VM on nurse line requesting information on getting referral for colonoscopy. [phone]

## 2021-08-31 ENCOUNTER — Ambulatory Visit (INDEPENDENT_AMBULATORY_CARE_PROVIDER_SITE_OTHER): Payer: Medicare HMO | Admitting: Nurse Practitioner

## 2021-08-31 ENCOUNTER — Encounter: Payer: Self-pay | Admitting: Nurse Practitioner

## 2021-08-31 VITALS — BP 154/83 | HR 60 | Temp 97.7°F | Ht 60.0 in | Wt 160.2 lb

## 2021-08-31 DIAGNOSIS — I10 Essential (primary) hypertension: Secondary | ICD-10-CM | POA: Diagnosis not present

## 2021-08-31 DIAGNOSIS — B029 Zoster without complications: Secondary | ICD-10-CM | POA: Diagnosis not present

## 2021-08-31 DIAGNOSIS — M79671 Pain in right foot: Secondary | ICD-10-CM

## 2021-08-31 DIAGNOSIS — G8929 Other chronic pain: Secondary | ICD-10-CM

## 2021-08-31 DIAGNOSIS — Z1211 Encounter for screening for malignant neoplasm of colon: Secondary | ICD-10-CM

## 2021-08-31 DIAGNOSIS — B351 Tinea unguium: Secondary | ICD-10-CM | POA: Diagnosis not present

## 2021-08-31 MED ORDER — TERBINAFINE HCL 250 MG PO TABS: 250.0000 mg | ORAL_TABLET | Freq: Every day | ORAL | 2 refills | Status: AC

## 2021-08-31 MED ORDER — GABAPENTIN 300 MG PO CAPS: 300.0000 mg | ORAL_CAPSULE | Freq: Two times a day (BID) | ORAL | 0 refills | Status: DC

## 2021-08-31 MED ORDER — HYDROCHLOROTHIAZIDE 25 MG PO TABS: 25.0000 mg | ORAL_TABLET | Freq: Every day | ORAL | 3 refills | Status: DC

## 2021-09-01 LAB — COMPREHENSIVE METABOLIC PANEL
ALT: 12 IU/L (ref 0–32)
AST: 18 IU/L (ref 0–40)
Albumin/Globulin Ratio: 2.2 (ref 1.2–2.2)
Albumin: 4.8 g/dL (ref 3.8–4.9)
Alkaline Phosphatase: 88 IU/L (ref 44–121)
BUN/Creatinine Ratio: 15 (ref 9–23)
BUN: 10 mg/dL (ref 6–24)
Bilirubin Total: <0.2 mg/dL (ref 0.0–1.2)
CO2: 24 mmol/L (ref 20–29)
Calcium: 10 mg/dL (ref 8.7–10.2)
Chloride: 99 mmol/L (ref 96–106)
Creatinine, Ser: 0.67 mg/dL (ref 0.57–1.00)
Globulin, Total: 2.2 g/dL (ref 1.5–4.5)
Glucose: 87 mg/dL (ref 70–99)
Potassium: 3.9 mmol/L (ref 3.5–5.2)
Sodium: 140 mmol/L (ref 134–144)
Total Protein: 7 g/dL (ref 6.0–8.5)
eGFR: 105 mL/min/{1.73_m2} (ref >59)

## 2021-09-01 LAB — CBC
Hematocrit: 44.8 % (ref 34.0–46.6)
Hemoglobin: 15.1 g/dL (ref 11.1–15.9)
MCH: 32 pg (ref 26.6–33.0)
MCHC: 33.7 g/dL (ref 31.5–35.7)
MCV: 95 fL (ref 79–97)
Platelets: 355 10*3/uL (ref 150–450)
RBC: 4.72 x10E6/uL (ref 3.77–5.28)
RDW: 12 % (ref 11.7–15.4)
WBC: 7.5 10*3/uL (ref 3.4–10.8)

## 2021-10-24 ENCOUNTER — Ambulatory Visit: Payer: Medicare HMO | Admitting: Nurse Practitioner

## 2021-11-22 ENCOUNTER — Encounter: Payer: Self-pay | Admitting: Gastroenterology

## 2021-12-06 ENCOUNTER — Ambulatory Visit (AMBULATORY_SURGERY_CENTER): Payer: Self-pay | Admitting: *Deleted

## 2021-12-06 VITALS — Ht 60.0 in | Wt 158.6 lb

## 2021-12-06 DIAGNOSIS — Z1211 Encounter for screening for malignant neoplasm of colon: Secondary | ICD-10-CM

## 2021-12-06 MED ORDER — PEG 3350-KCL-NA BICARB-NACL 420 G PO SOLR: 4000.0000 mL | Freq: Once | ORAL | 0 refills | Status: AC

## 2021-12-06 NOTE — Progress Notes (Signed)
No egg or soy allergy known to patient  No issues known to pt with past sedation with any surgeries or procedures Patient denies ever being told they had issues or difficulty with intubation  No FH of Malignant Hyperthermia Pt is not on diet pills Pt is not on  home 02  Pt is not on blood thinners  Pt denies issues with constipation  No A fib or A flutter Have any cardiac testing pending--no Pt instructed to use Singlecare.com or GoodRx for a price reduction on prep      Sign language interp. Used during pre-visit.

## 2021-12-21 ENCOUNTER — Encounter: Payer: Self-pay | Admitting: Gastroenterology

## 2021-12-31 ENCOUNTER — Ambulatory Visit (AMBULATORY_SURGERY_CENTER): Payer: Medicare HMO | Admitting: Gastroenterology

## 2021-12-31 ENCOUNTER — Encounter: Payer: Self-pay | Admitting: Gastroenterology

## 2021-12-31 VITALS — BP 140/74 | HR 68 | Temp 98.6°F | Resp 11 | Ht 60.0 in | Wt 158.0 lb

## 2021-12-31 DIAGNOSIS — Z1211 Encounter for screening for malignant neoplasm of colon: Secondary | ICD-10-CM | POA: Diagnosis not present

## 2021-12-31 DIAGNOSIS — F32A Depression, unspecified: Secondary | ICD-10-CM | POA: Diagnosis not present

## 2021-12-31 DIAGNOSIS — F419 Anxiety disorder, unspecified: Secondary | ICD-10-CM | POA: Diagnosis not present

## 2021-12-31 DIAGNOSIS — I10 Essential (primary) hypertension: Secondary | ICD-10-CM | POA: Diagnosis not present

## 2021-12-31 MED ORDER — SODIUM CHLORIDE 0.9 % IV SOLN: 500.0000 mL | Freq: Once | INTRAVENOUS | Status: DC

## 2021-12-31 NOTE — Patient Instructions (Signed)
Resume previous diet and medications. Repeat Colonoscopy in 10 years for surveillance.  YOU HAD AN ENDOSCOPIC PROCEDURE TODAY AT THE [facility]:   Refer to the procedure report that was given to you for any specific questions about what was found during the examination.  If the procedure report does not answer your questions, please call your gastroenterologist to clarify.  If you requested that your care partner not be given the details of your procedure findings, then the procedure report has been included in a sealed envelope for you to review at your convenience later.  YOU SHOULD EXPECT: Some feelings of bloating in the abdomen. Passage of more gas than usual.  Walking can help get rid of the air that was put into your GI tract during the procedure and reduce the bloating. If you had a lower endoscopy (such as a colonoscopy or flexible sigmoidoscopy) you may notice spotting of blood in your stool or on the toilet paper. If you underwent a bowel prep for your procedure, you may not have a normal bowel movement for a few days.  Please Note:  You might notice some irritation and congestion in your nose or some drainage.  This is from the oxygen used during your procedure.  There is no need for concern and it should clear up in a day or so.  SYMPTOMS TO REPORT IMMEDIATELY:  Following lower endoscopy (colonoscopy or flexible sigmoidoscopy):  Excessive amounts of blood in the stool  Significant tenderness or worsening of abdominal pains  Swelling of the abdomen that is new, acute  Fever of 100F or higher   For urgent or emergent issues, a gastroenterologist can be reached at any hour by calling [phone]. Do not use MyChart messaging for urgent concerns.    DIET:  We do recommend a small meal at first, but then you may proceed to your regular diet.  Drink plenty of fluids but you should avoid alcoholic beverages for 24 hours.  ACTIVITY:  You should plan to take it easy for  the rest of today and you should NOT DRIVE or use heavy machinery until tomorrow (because of the sedation medicines used during the test).    FOLLOW UP: Our staff will call the number listed on your records the next business day following your procedure.  We will call around 7:15- 8:00 am to check on you and address any questions or concerns that you may have regarding the information given to you following your procedure. If we do not reach you, we will leave a message.     If any biopsies were taken you will be contacted by phone or by letter within the next 1-3 weeks.  Please call us at [phone] if you have not heard about the biopsies in 3 weeks.    SIGNATURES/CONFIDENTIALITY: You and/or your care partner have signed paperwork which will be entered into your electronic medical record.  These signatures attest to the fact that that the information above on your After Visit Summary has been reviewed and is understood.  Full responsibility of the confidentiality of this discharge information lies with you and/or your care-partner. 

## 2021-12-31 NOTE — Progress Notes (Signed)
[facility] History and Physical   Primary Care Physician:  Vevelyn Francois, NP   Reason for Procedure:   Colon cancer screening  Plan:    Screening colonoscopy     HPI: Christy Price is a 51 y.o. female undergoing initial average risk screening colonoscopy.  She has no family history of colon cancer and no chronic GI symptoms.    Past Medical History:  Diagnosis Date   Allergy    seasonal   Anxiety    Arthritis    shoulder,knees,feet   Back spasm    Cancer (HCC)     hx cervical cx    Depression    GERD (gastroesophageal reflux disease)    Hypertension    Meningitis    Seizures ([place])    greater than 25 years ago    Past Surgical History:  Procedure Laterality Date   carpal tunnel Left    LAPAROSCOPIC HYSTERECTOMY     2000   SHUNT REMOVAL     2007   SHUNT REPLACEMENT     x 4    Prior to Admission medications   Medication Sig Start Date End Date Taking? Authorizing Provider  Cholecalciferol (D3 PO) Take by mouth.   Yes [provider]  hydrochlorothiazide (HYDRODIURIL) 25 MG tablet Take 1 tablet (25 mg total) by mouth daily. 08/31/21  Yes Fenton Foy, NP  Blood Pressure Monitor MISC Use as directed daily Patient not taking: Reported on 12/06/2021 11/17/20   Vevelyn Francois, NP  celecoxib (CELEBREX) 200 MG capsule Take 1 capsule (200 mg total) by mouth 2 (two) times daily. Patient not taking: Reported on 12/06/2021 12/28/19   Azzie Glatter, FNP  cyclobenzaprine (FLEXERIL) 10 MG tablet Take 1 tablet (10 mg total) by mouth 3 (three) times daily as needed for muscle spasms. Patient not taking: Reported on 12/06/2021 10/23/20   Vevelyn Francois, NP  furosemide (LASIX) 20 MG tablet Take 1 tablet (20 mg total) by mouth daily. 10/23/20   Vevelyn Francois, NP  gabapentin (NEURONTIN) 300 MG capsule Take 1 capsule (300 mg total) by mouth 2 (two) times daily for 10 days. 08/31/21 09/10/21  Fenton Foy, NP  gabapentin (NEURONTIN) 300 MG capsule  Take 300 mg by mouth as needed.    [provider]  hydrOXYzine (ATARAX/VISTARIL) 10 MG tablet Take 1 tablet (10 mg total) by mouth 3 (three) times daily as needed. Patient not taking: Reported on 12/06/2021 01/09/21   Bo Merino I, NP  lisinopril (ZESTRIL) 10 MG tablet Take 1 tablet (10 mg total) by mouth daily. Patient not taking: Reported on 08/31/2021 12/28/19   Azzie Glatter, FNP  terbinafine (LAMISIL) 250 MG tablet Take 1 tablet by mouth daily. Patient not taking: Reported on 12/31/2021    [provider]    Current Outpatient Medications  Medication Sig Dispense Refill   Cholecalciferol (D3 PO) Take by mouth.     hydrochlorothiazide (HYDRODIURIL) 25 MG tablet Take 1 tablet (25 mg total) by mouth daily. 90 tablet 3   Blood Pressure Monitor MISC Use as directed daily (Patient not taking: Reported on 12/06/2021) 1 each 0   celecoxib (CELEBREX) 200 MG capsule Take 1 capsule (200 mg total) by mouth 2 (two) times daily. (Patient not taking: Reported on 12/06/2021) 180 capsule 3   cyclobenzaprine (FLEXERIL) 10 MG tablet Take 1 tablet (10 mg total) by mouth 3 (three) times daily as needed for muscle spasms. (Patient not taking: Reported on 12/06/2021) 30 tablet  6   furosemide (LASIX) 20 MG tablet Take 1 tablet (20 mg total) by mouth daily. 90 tablet 3   gabapentin (NEURONTIN) 300 MG capsule Take 1 capsule (300 mg total) by mouth 2 (two) times daily for 10 days. 20 capsule 0   gabapentin (NEURONTIN) 300 MG capsule Take 300 mg by mouth as needed.     hydrOXYzine (ATARAX/VISTARIL) 10 MG tablet Take 1 tablet (10 mg total) by mouth 3 (three) times daily as needed. (Patient not taking: Reported on 12/06/2021) 30 tablet 0   lisinopril (ZESTRIL) 10 MG tablet Take 1 tablet (10 mg total) by mouth daily. (Patient not taking: Reported on 08/31/2021) 90 tablet 3   terbinafine (LAMISIL) 250 MG tablet Take 1 tablet by mouth daily. (Patient not taking: Reported on 12/31/2021)     Current  Facility-Administered Medications  Medication Dose Route Frequency Provider Last Rate Last Admin   0.9 %  sodium chloride infusion  500 mL Intravenous Once Daryel November, MD        Allergies as of 12/31/2021 - Review Complete 12/31/2021  Allergen Reaction Noted   Epinephrine Other (See Comments) 08/10/2021   Oxycodone  11/02/2014   Rifampin  11/02/2014    Family History  Problem Relation Age of Onset   Crohn's disease Mother    Breast cancer Sister    Colon cancer Neg Hx    Colon polyps Neg Hx    Esophageal cancer Neg Hx    Rectal cancer Neg Hx    Stomach cancer Neg Hx    Ulcerative colitis Neg Hx     Social History   Socioeconomic History   Marital status: Married    Spouse name: Not on file   Number of children: Not on file   Years of education: Not on file   Highest education level: Not on file  Occupational History   Not on file  Tobacco Use   Smoking status: Every Day    Packs/day: 1.50    Types: Cigarettes    Passive exposure: Never   Smokeless tobacco: Never   Tobacco comments:    1 pack a week.  Vaping Use   Vaping Use: Never used  Substance and Sexual Activity   Alcohol use: Yes    Alcohol/week: 0.0 standard drinks of alcohol    Comment: Social   Drug use: No   Sexual activity: Not Currently  Other Topics Concern   Not on file  Social History Narrative   Not on file   Social Determinants of Health   Financial Resource Strain: Not on file  Food Insecurity: Not on file  Transportation Needs: Not on file  Physical Activity: Not on file  Stress: Not on file  Social Connections: Not on file  Intimate Partner Violence: Not on file    Review of Systems:  All other review of systems negative except as mentioned in the HPI.  Physical Exam: Vital signs BP (!) 140/81   Pulse 66   Temp 98.6 F (37 C) (Temporal)   Ht 5' (1.524 m)   Wt 158 lb (71.7 kg)   SpO2 97%   BMI 30.86 kg/m   General:   Alert,  Well-developed, well-nourished,  pleasant and cooperative in NAD Airway:  Mallampati 3 Lungs:  Clear throughout to auscultation.   Heart:  Regular rate and rhythm; no murmurs, clicks, rubs,  or gallops. Abdomen:  Soft, nontender and nondistended. Normal bowel sounds.   Neuro/Psych:  Normal mood and affect. A and O x  [place] Candis Schatz, MD [facility]

## 2021-12-31 NOTE — Progress Notes (Signed)
To pacu vss. Report to RN.tb

## 2021-12-31 NOTE — Progress Notes (Signed)
Pt's states no medical or surgical changes since previsit or office visit.  Interpreter used today at the [facility] for this pt.  Interpreter's name is- Lyndee Leo

## 2021-12-31 NOTE — Op Note (Signed)
Christy Price: Christy Price Procedure Date: 12/31/2021 12:15 PM MRN: [id number] Endoscopist: Nicki Reaper E. Candis Schatz , MD Age: 51 Referring MD:  Date of Birth: 10-Aug-1970 Gender: Female Account #: [id number] Procedure:                Colonoscopy Indications:              Screening for colorectal malignant neoplasm, This                            is the patient's first colonoscopy Medicines:                Monitored Anesthesia Care Procedure:                Pre-Anesthesia Assessment:                           - Prior to the procedure, a History and Physical                            was performed, and patient medications and                            allergies were reviewed. The patient's tolerance of                            previous anesthesia was also reviewed. The risks                            and benefits of the procedure and the sedation                            options and risks were discussed with the patient.                            All questions were answered, and informed consent                            was obtained. Prior Anticoagulants: The patient has                            taken no previous anticoagulant or antiplatelet                            agents. ASA Grade Assessment: II - A patient with                            mild systemic disease. After reviewing the risks                            and benefits, the patient was deemed in                            satisfactory condition to undergo the procedure.  After obtaining informed consent, the colonoscope                            was passed under direct vision. Throughout the                            procedure, the patient's blood pressure, pulse, and                            oxygen saturations were monitored continuously. The                            Olympus CF-HQ190L ([id number]) Colonoscope was                            introduced through the  anus and advanced to the the                            cecum, identified by appendiceal orifice and                            ileocecal valve. The colonoscopy was performed                            without difficulty. The patient tolerated the                            procedure well. The quality of the bowel                            preparation was good. The ileocecal valve,                            appendiceal orifice, and rectum were photographed.                            The bowel preparation used was SUPREP via split                            dose instruction. Scope In: 12:23:20 PM Scope Out: 12:34:26 PM Scope Withdrawal Time: 0 hours 5 minutes 48 seconds  Total Procedure Duration: 0 hours 11 minutes 6 seconds  Findings:                 The perianal and digital rectal examinations were                            normal. Pertinent negatives include normal                            sphincter tone and no palpable rectal lesions.                           Multiple small and large-mouthed diverticula were  found in the sigmoid colon and descending colon.                           The exam was otherwise normal throughout the                            examined colon.                           The retroflexed view of the distal rectum and anal                            verge was normal and showed no anal or rectal                            abnormalities. Complications:            No immediate complications. Estimated Blood Loss:     Estimated blood loss: none. Impression:               - Diverticulosis in the sigmoid colon and in the                            descending colon.                           - The distal rectum and anal verge are normal on                            retroflexion view.                           - No specimens collected. Recommendation:           - Patient has a contact number available for                             emergencies. The signs and symptoms of potential                            delayed complications were discussed with the                            patient. Return to normal activities tomorrow.                            Written discharge instructions were provided to the                            patient.                           - Resume previous diet.                           - Continue present medications.                           -  Repeat colonoscopy in 10 years for screening                            purposes. Christy Price E. Candis Schatz, MD 12/31/2021 12:39:00 PM This report has been signed electronically.

## 2022-01-01 ENCOUNTER — Telehealth: Payer: Self-pay

## 2022-01-01 NOTE — Telephone Encounter (Signed)
Attempted f/u call. No answers, left VM

## 2022-01-28 ENCOUNTER — Ambulatory Visit (HOSPITAL_COMMUNITY)
Admission: RE | Admit: 2022-01-28 | Discharge: 2022-01-28 | Disposition: A | Discharge status: Home / Self Care | Payer: Medicare HMO | Source: Ambulatory Visit | Attending: Nurse Practitioner | Admitting: Nurse Practitioner

## 2022-01-28 ENCOUNTER — Ambulatory Visit (INDEPENDENT_AMBULATORY_CARE_PROVIDER_SITE_OTHER): Payer: Medicare HMO | Admitting: Nurse Practitioner

## 2022-01-28 ENCOUNTER — Encounter: Payer: Self-pay | Admitting: Nurse Practitioner

## 2022-01-28 VITALS — BP 150/77 | HR 64 | Ht 60.0 in | Wt 157.0 lb

## 2022-01-28 DIAGNOSIS — M25551 Pain in right hip: Secondary | ICD-10-CM

## 2022-01-28 DIAGNOSIS — F32A Depression, unspecified: Secondary | ICD-10-CM

## 2022-01-28 DIAGNOSIS — R609 Edema, unspecified: Secondary | ICD-10-CM

## 2022-01-28 DIAGNOSIS — R600 Localized edema: Secondary | ICD-10-CM

## 2022-01-28 DIAGNOSIS — I10 Essential (primary) hypertension: Secondary | ICD-10-CM

## 2022-01-28 MED ORDER — ESCITALOPRAM OXALATE 10 MG PO TABS: 10.0000 mg | ORAL_TABLET | Freq: Every day | ORAL | 0 refills | Status: DC

## 2022-01-28 MED ORDER — PREDNISONE 20 MG PO TABS: 20.0000 mg | ORAL_TABLET | Freq: Every day | ORAL | 0 refills | Status: AC

## 2022-01-28 MED ORDER — HYDROCHLOROTHIAZIDE 25 MG PO TABS: 25.0000 mg | ORAL_TABLET | Freq: Every day | ORAL | 3 refills | Status: DC

## 2022-01-28 MED ORDER — FUROSEMIDE 20 MG PO TABS: 20.0000 mg | ORAL_TABLET | Freq: Every day | ORAL | 3 refills | Status: DC

## 2022-01-28 NOTE — Progress Notes (Signed)
$'@Patient'h$  ID: Christy Price, female    DOB: [birth year], 51 y.o.   MRN: [id number]  Chief Complaint  Patient presents with   Hip Pain    Pt stated--right hip pain, stiff especially when sitting the car--1 week and getting worse.    Referring provider: Vevelyn Francois, NP   HPI  Christy Price presents for follow up. She  has a past medical history of Cancer ([place]), Hypertension, and Meningitis.    Patient presents today for right hip pain.  She states that this started within the past couple weeks.  She states that she notices this more when she is driving her car when getting out of the car.  We discussed that we will trial prednisone and we will order x-ray.  Patient denies any recent injuries.  Patient states that the pain starts in her buttock and travels to her hip and to her thigh.  We discussed that this could be sciatic nerve pain. Denies f/c/s, n/v/d, hemoptysis, PND, leg swelling Denies chest pain or edema    Allergies  Allergen Reactions   Epinephrine Other (See Comments)   Oxycodone    Rifampin     Immunization History  Administered Date(s) Administered   Influenza,inj,Quad PF,6+ Mos 11/04/2018, 12/28/2019   PFIZER(Purple Top)SARS-COV-2 Vaccination 06/10/2019, 07/06/2019, 04/07/2020   Tdap 04/09/2017    Past Medical History:  Diagnosis Date   Allergy    seasonal   Anxiety    Arthritis    shoulder,knees,feet   Back spasm    Cancer (HCC)     hx cervical cx    Depression    GERD (gastroesophageal reflux disease)    Hypertension    Meningitis    Seizures ([facility]CC)    greater than 25 years ago    Tobacco History: Social History   Tobacco Use  Smoking Status Every Day   Packs/day: 1.50   Types: Cigarettes   Passive exposure: Never  Smokeless Tobacco Never  Tobacco Comments   1 pack a week.   Ready to quit: Not Answered Counseling given: Not Answered Tobacco comments: 1 pack a week.   Outpatient Encounter Medications as of 01/28/2022   Medication Sig   escitalopram (LEXAPRO) 10 MG tablet Take 1 tablet (10 mg total) by mouth daily.   predniSONE (DELTASONE) 20 MG tablet Take 1 tablet (20 mg total) by mouth daily with breakfast for 5 days.   Blood Pressure Monitor MISC Use as directed daily (Patient not taking: Reported on 12/06/2021)   celecoxib (CELEBREX) 200 MG capsule Take 1 capsule (200 mg total) by mouth 2 (two) times daily. (Patient not taking: Reported on 12/06/2021)   Cholecalciferol (D3 PO) Take by mouth.   cyclobenzaprine (FLEXERIL) 10 MG tablet Take 1 tablet (10 mg total) by mouth 3 (three) times daily as needed for muscle spasms. (Patient not taking: Reported on 12/06/2021)   furosemide (LASIX) 20 MG tablet Take 1 tablet (20 mg total) by mouth daily.   gabapentin (NEURONTIN) 300 MG capsule Take 1 capsule (300 mg total) by mouth 2 (two) times daily for 10 days.   gabapentin (NEURONTIN) 300 MG capsule Take 300 mg by mouth as needed.   hydrochlorothiazide (HYDRODIURIL) 25 MG tablet Take 1 tablet (25 mg total) by mouth daily.   hydrOXYzine (ATARAX/VISTARIL) 10 MG tablet Take 1 tablet (10 mg total) by mouth 3 (three) times daily as needed. (Patient not taking: Reported on 12/06/2021)   lisinopril (ZESTRIL) 10 MG tablet Take 1 tablet (10 mg total) by  mouth daily. (Patient not taking: Reported on 08/31/2021)   terbinafine (LAMISIL) 250 MG tablet Take 1 tablet by mouth daily. (Patient not taking: Reported on 12/31/2021)   [DISCONTINUED] furosemide (LASIX) 20 MG tablet Take 1 tablet (20 mg total) by mouth daily.   [DISCONTINUED] hydrochlorothiazide (HYDRODIURIL) 25 MG tablet Take 1 tablet (25 mg total) by mouth daily.   Facility-Administered Encounter Medications as of 01/28/2022  Medication   0.9 %  sodium chloride infusion     Review of Systems  Review of Systems  Constitutional: Negative.   HENT: Negative.    Cardiovascular: Negative.   Gastrointestinal: Negative.   Allergic/Immunologic: Negative.   Neurological:  Negative.   Psychiatric/Behavioral: Negative.         Physical Exam  BP (!) 150/77   Pulse 64   Ht 5' (1.524 m)   Wt 157 lb (71.2 kg)   SpO2 97%   BMI 30.66 kg/m   Wt Readings from Last 5 Encounters:  01/28/22 157 lb (71.2 kg)  12/31/21 158 lb (71.7 kg)  12/06/21 158 lb 9.6 oz (71.9 kg)  08/31/21 160 lb 3.2 oz (72.7 kg)  04/26/21 165 lb 12.8 oz (75.2 kg)     Physical Exam Vitals and nursing note reviewed.  Constitutional:      General: She is not in acute distress.    Appearance: She is well-developed.  Cardiovascular:     Rate and Rhythm: Normal rate and regular rhythm.  Pulmonary:     Effort: Pulmonary effort is normal.     Breath sounds: Normal breath sounds.  Neurological:     Mental Status: She is alert and oriented to person, place, and time.      Lab Results:  CBC    Component Value Date/Time   WBC 7.5 08/31/2021 1415   WBC 9.9 08/22/2016 1534   RBC 4.72 08/31/2021 1415   RBC 4.53 08/22/2016 1534   HGB 15.1 08/31/2021 1415   HCT 44.8 08/31/2021 1415   PLT 355 08/31/2021 1415   MCV 95 08/31/2021 1415   MCH 32.0 08/31/2021 1415   MCH 31.3 08/22/2016 1534   MCHC 33.7 08/31/2021 1415   MCHC 34.1 08/22/2016 1534   RDW 12.0 08/31/2021 1415   LYMPHSABS 2.8 04/30/2021 0843   MONOABS 792 08/22/2016 1534   EOSABS 0.1 04/30/2021 0843   BASOSABS 0.1 04/30/2021 0843    BMET    Component Value Date/Time   NA 140 08/31/2021 1415   K 3.9 08/31/2021 1415   CL 99 08/31/2021 1415   CO2 24 08/31/2021 1415   GLUCOSE 87 08/31/2021 1415   GLUCOSE 84 10/07/2016 1536   BUN 10 08/31/2021 1415   CREATININE 0.67 08/31/2021 1415   CREATININE 0.81 10/07/2016 1536   CALCIUM 10.0 08/31/2021 1415   GFRNONAA 96 11/05/2019 1445   GFRNONAA >89 08/22/2016 1534   GFRAA 111 11/05/2019 1445   GFRAA >89 08/22/2016 1534    BNP    Component Value Date/Time   BNP 58.0 08/22/2016 1534     Assessment & Plan:   Peripheral edema - furosemide (LASIX) 20 MG tablet;  Take 1 tablet (20 mg total) by mouth daily.  Dispense: 90 tablet; Refill: 3  2. Bilateral lower extremity edema  - furosemide (LASIX) 20 MG tablet; Take 1 tablet (20 mg total) by mouth daily.  Dispense: 90 tablet; Refill: 3  3. Essential hypertension  - hydrochlorothiazide (HYDRODIURIL) 25 MG tablet; Take 1 tablet (25 mg total) by mouth daily.  Dispense: 90 tablet; Refill:  3  4. Right hip pain  - DG Hip Unilat W OR W/O Pelvis 2-3 Views Right - predniSONE (DELTASONE) 20 MG tablet; Take 1 tablet (20 mg total) by mouth daily with breakfast for 5 days.  Dispense: 5 tablet; Refill: 0  Follow up:  Follow up as scheduled     Fenton Foy, NP 01/28/2022

## 2022-01-28 NOTE — Assessment & Plan Note (Signed)
-   furosemide (LASIX) 20 MG tablet; Take 1 tablet (20 mg total) by mouth daily.  Dispense: 90 tablet; Refill: 3  2. Bilateral lower extremity edema  - furosemide (LASIX) 20 MG tablet; Take 1 tablet (20 mg total) by mouth daily.  Dispense: 90 tablet; Refill: 3  3. Essential hypertension  - hydrochlorothiazide (HYDRODIURIL) 25 MG tablet; Take 1 tablet (25 mg total) by mouth daily.  Dispense: 90 tablet; Refill: 3  4. Right hip pain  - DG Hip Unilat W OR W/O Pelvis 2-3 Views Right - predniSONE (DELTASONE) 20 MG tablet; Take 1 tablet (20 mg total) by mouth daily with breakfast for 5 days.  Dispense: 5 tablet; Refill: 0  Follow up:  Follow up as scheduled

## 2022-01-28 NOTE — Patient Instructions (Addendum)
1. Peripheral edema  - furosemide (LASIX) 20 MG tablet; Take 1 tablet (20 mg total) by mouth daily.  Dispense: 90 tablet; Refill: 3  2. Bilateral lower extremity edema  - furosemide (LASIX) 20 MG tablet; Take 1 tablet (20 mg total) by mouth daily.  Dispense: 90 tablet; Refill: 3  3. Essential hypertension  - hydrochlorothiazide (HYDRODIURIL) 25 MG tablet; Take 1 tablet (25 mg total) by mouth daily.  Dispense: 90 tablet; Refill: 3  4. Right hip pain  - DG Hip Unilat W OR W/O Pelvis 2-3 Views Right - predniSONE (DELTASONE) 20 MG tablet; Take 1 tablet (20 mg total) by mouth daily with breakfast for 5 days.  Dispense: 5 tablet; Refill: 0  Follow up:  Follow up as scheduled

## 2022-02-04 ENCOUNTER — Encounter: Payer: Self-pay | Admitting: *Deleted

## 2022-02-27 ENCOUNTER — Ambulatory Visit (INDEPENDENT_AMBULATORY_CARE_PROVIDER_SITE_OTHER): Payer: Medicare HMO | Admitting: Nurse Practitioner

## 2022-02-27 ENCOUNTER — Encounter: Payer: Self-pay | Admitting: Nurse Practitioner

## 2022-02-27 VITALS — BP 140/80 | HR 60 | Ht 60.0 in | Wt 157.0 lb

## 2022-02-27 DIAGNOSIS — M25551 Pain in right hip: Secondary | ICD-10-CM

## 2022-02-27 DIAGNOSIS — F32A Depression, unspecified: Secondary | ICD-10-CM | POA: Diagnosis not present

## 2022-02-27 DIAGNOSIS — F419 Anxiety disorder, unspecified: Secondary | ICD-10-CM | POA: Diagnosis not present

## 2022-02-27 MED ORDER — MELOXICAM 7.5 MG PO TABS: 7.5000 mg | ORAL_TABLET | Freq: Every day | ORAL | 0 refills | Status: DC

## 2022-02-27 NOTE — Patient Instructions (Signed)
1. Anxiety and depression  - Ambulatory referral to Psychiatry  2. Hip pain, acute, right  - meloxicam (MOBIC) 7.5 MG tablet; Take 1 tablet (7.5 mg total) by mouth daily.  Dispense: 30 tablet; Refill: 0 - Ambulatory referral to Physical Therapy  Follow up:  Follow up in 3 months or sooner

## 2022-02-27 NOTE — Progress Notes (Signed)
$'@Patient'i$  ID: Christy Price, female    DOB: 10-12-1970, 51 y.o.   MRN: [id number]  Chief Complaint  Patient presents with   Follow-up    Pt stated--right hip still painful, especially when getting out of the car.    Referring provider: Vevelyn Francois, NP   HPI  Patient presents today for follow-up on anxiety depression and right hip pain.  Her x-ray at last visit was clear.  She was started on Lexapro for anxiety depression but states that this made her too drowsy.  We will refer her for counseling.  Patient states that her hip is still hurting.  We will place referral for her for physical therapy. Denies f/c/s, n/v/d, hemoptysis, PND, leg swelling Denies chest pain or edema        Allergies  Allergen Reactions   Epinephrine Other (See Comments)   Oxycodone    Rifampin     Immunization History  Administered Date(s) Administered   Influenza,inj,Quad PF,6+ Mos 11/04/2018, 12/28/2019   PFIZER(Purple Top)SARS-COV-2 Vaccination 06/10/2019, 07/06/2019, 04/07/2020   Tdap 04/09/2017    Past Medical History:  Diagnosis Date   Allergy    seasonal   Anxiety    Arthritis    shoulder,knees,feet   Back spasm    Cancer (HCC)     hx cervical cx    Depression    GERD (gastroesophageal reflux disease)    Hypertension    Meningitis    Seizures (HCC)    greater than 25 years ago    Tobacco History: Social History   Tobacco Use  Smoking Status Every Day   Packs/day: 1.50   Types: Cigarettes   Passive exposure: Never  Smokeless Tobacco Never  Tobacco Comments   1 pack a week.   Ready to quit: Not Answered Counseling given: Not Answered Tobacco comments: 1 pack a week.   Outpatient Encounter Medications as of 02/27/2022  Medication Sig   meloxicam (MOBIC) 7.5 MG tablet Take 1 tablet (7.5 mg total) by mouth daily.   Blood Pressure Monitor MISC Use as directed daily (Patient not taking: Reported on 12/06/2021)   celecoxib (CELEBREX) 200 MG capsule Take 1  capsule (200 mg total) by mouth 2 (two) times daily. (Patient not taking: Reported on 12/06/2021)   Cholecalciferol (D3 PO) Take by mouth.   cyclobenzaprine (FLEXERIL) 10 MG tablet Take 1 tablet (10 mg total) by mouth 3 (three) times daily as needed for muscle spasms. (Patient not taking: Reported on 12/06/2021)   furosemide (LASIX) 20 MG tablet Take 1 tablet (20 mg total) by mouth daily.   gabapentin (NEURONTIN) 300 MG capsule Take 1 capsule (300 mg total) by mouth 2 (two) times daily for 10 days.   gabapentin (NEURONTIN) 300 MG capsule Take 300 mg by mouth as needed.   hydrochlorothiazide (HYDRODIURIL) 25 MG tablet Take 1 tablet (25 mg total) by mouth daily.   hydrOXYzine (ATARAX/VISTARIL) 10 MG tablet Take 1 tablet (10 mg total) by mouth 3 (three) times daily as needed. (Patient not taking: Reported on 12/06/2021)   lisinopril (ZESTRIL) 10 MG tablet Take 1 tablet (10 mg total) by mouth daily. (Patient not taking: Reported on 08/31/2021)   terbinafine (LAMISIL) 250 MG tablet Take 1 tablet by mouth daily. (Patient not taking: Reported on 12/31/2021)   [DISCONTINUED] escitalopram (LEXAPRO) 10 MG tablet Take 1 tablet (10 mg total) by mouth daily.   Facility-Administered Encounter Medications as of 02/27/2022  Medication   0.9 %  sodium chloride infusion  Review of Systems  Review of Systems  Constitutional: Negative.   HENT: Negative.    Cardiovascular: Negative.   Gastrointestinal: Negative.   Allergic/Immunologic: Negative.   Neurological: Negative.   Psychiatric/Behavioral: Negative.         Physical Exam  BP (!) 140/80   Pulse 60   Ht 5' (1.524 m)   Wt 157 lb (71.2 kg)   SpO2 99%   BMI 30.66 kg/m   Wt Readings from Last 5 Encounters:  02/27/22 157 lb (71.2 kg)  01/28/22 157 lb (71.2 kg)  12/31/21 158 lb (71.7 kg)  12/06/21 158 lb 9.6 oz (71.9 kg)  08/31/21 160 lb 3.2 oz (72.7 kg)     Physical Exam Vitals and nursing note reviewed.  Constitutional:      General:  She is not in acute distress.    Appearance: She is well-developed.  Cardiovascular:     Rate and Rhythm: Normal rate and regular rhythm.  Pulmonary:     Effort: Pulmonary effort is normal.     Breath sounds: Normal breath sounds.  Neurological:     Mental Status: She is alert and oriented to person, place, and time.      Lab Results:  CBC    Component Value Date/Time   WBC 7.5 08/31/2021 1415   WBC 9.9 08/22/2016 1534   RBC 4.72 08/31/2021 1415   RBC 4.53 08/22/2016 1534   HGB 15.1 08/31/2021 1415   HCT 44.8 08/31/2021 1415   PLT 355 08/31/2021 1415   MCV 95 08/31/2021 1415   MCH 32.0 08/31/2021 1415   MCH 31.3 08/22/2016 1534   MCHC 33.7 08/31/2021 1415   MCHC 34.1 08/22/2016 1534   RDW 12.0 08/31/2021 1415   LYMPHSABS 2.8 04/30/2021 0843   MONOABS 792 08/22/2016 1534   EOSABS 0.1 04/30/2021 0843   BASOSABS 0.1 04/30/2021 0843    BMET    Component Value Date/Time   NA 140 08/31/2021 1415   K 3.9 08/31/2021 1415   CL 99 08/31/2021 1415   CO2 24 08/31/2021 1415   GLUCOSE 87 08/31/2021 1415   GLUCOSE 84 10/07/2016 1536   BUN 10 08/31/2021 1415   CREATININE 0.67 08/31/2021 1415   CREATININE 0.81 10/07/2016 1536   CALCIUM 10.0 08/31/2021 1415   GFRNONAA 96 11/05/2019 1445   GFRNONAA >89 08/22/2016 1534   GFRAA 111 11/05/2019 1445   GFRAA >89 08/22/2016 1534    BNP    Component Value Date/Time   BNP 58.0 08/22/2016 1534     Assessment & Plan:   Anxiety and depression - Ambulatory referral to Psychiatry  2. Hip pain, acute, right  - meloxicam (MOBIC) 7.5 MG tablet; Take 1 tablet (7.5 mg total) by mouth daily.  Dispense: 30 tablet; Refill: 0 - Ambulatory referral to Physical Therapy  Follow up:  Follow up in 3 months or sooner     Fenton Foy, NP 02/27/2022

## 2022-02-27 NOTE — Assessment & Plan Note (Signed)
-   Ambulatory referral to Psychiatry  2. Hip pain, acute, right  - meloxicam (MOBIC) 7.5 MG tablet; Take 1 tablet (7.5 mg total) by mouth daily.  Dispense: 30 tablet; Refill: 0 - Ambulatory referral to Physical Therapy  Follow up:  Follow up in 3 months or sooner

## 2022-03-06 ENCOUNTER — Ambulatory Visit: Payer: Medicare HMO | Admitting: Nurse Practitioner

## 2022-03-18 ENCOUNTER — Ambulatory Visit: Payer: Medicare HMO | Attending: Nurse Practitioner

## 2022-03-18 ENCOUNTER — Other Ambulatory Visit: Payer: Self-pay

## 2022-03-18 DIAGNOSIS — R2689 Other abnormalities of gait and mobility: Secondary | ICD-10-CM

## 2022-03-18 DIAGNOSIS — M25551 Pain in right hip: Secondary | ICD-10-CM

## 2022-03-18 DIAGNOSIS — M6281 Muscle weakness (generalized): Secondary | ICD-10-CM | POA: Diagnosis not present

## 2022-03-18 NOTE — Therapy (Signed)
OUTPATIENT PHYSICAL THERAPY LOWER EXTREMITY EVALUATION   Patient Name: Christy Price MRN: [id number] DOB:28-Oct-1970, 52 y.o., female Today's Date: 03/18/2022  END OF SESSION:  PT End of Session - 03/18/22 1356     Visit Number 1    Number of Visits 13    Date for PT Re-Evaluation 04/29/22    Authorization Type [employer] MCR and MCD Secondary    PT Start Time 1220   interpreter arrived late   PT Stop Time 1302    PT Time Calculation (min) 42 min    Activity Tolerance Patient tolerated treatment well    Behavior During Therapy WFL for tasks assessed/performed             Past Medical History:  Diagnosis Date   Allergy    seasonal   Anxiety    Arthritis    shoulder,knees,feet   Back spasm    Cancer ([place])     hx cervical cx    Depression    GERD (gastroesophageal reflux disease)    Hypertension    Meningitis    Seizures ([place])    greater than 25 years ago   Past Surgical History:  Procedure Laterality Date   carpal tunnel Left    LAPAROSCOPIC HYSTERECTOMY     2000   SHUNT REMOVAL     2007   SHUNT REPLACEMENT     x 4   Patient Active Problem List   Diagnosis Date Noted   Anxiety and depression 02/27/2022   Language barrier 05/09/2019   Chronic pain of right knee 05/09/2019   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   Peripheral edema 11/04/2018   Essential hypertension 11/04/2018   Rash 11/04/2018   Pain in both lower legs 12/12/2016   Blepharitis of upper and lower eyelids of both eyes 09/24/2016   Keratoconjunctivitis sicca of both eyes not specified as Sjogren's 09/24/2016   Presbyopia of both eyes 09/24/2016   Vitreous floater, bilateral 09/24/2016   Partial nontraumatic tear of right rotator cuff 04/03/2016   Nail fungus 03/30/2015   Toe pain 03/30/2015   Insomnia 12/19/2014   Pedal edema 11/06/2014   Depression 11/06/2014   Genital herpes 11/06/2014   Back pain 11/06/2014    PCP: Vevelyn Francois, NP  REFERRING PROVIDER: Fenton Foy,  NP  REFERRING DIAG: [phone] (ICD-10-CM) - Hip pain, acute, right   THERAPY DIAG:  Pain in right hip  Muscle weakness (generalized)  Other abnormalities of gait and mobility  Rationale for Evaluation and Treatment: Rehabilitation  ONSET DATE: November 2023  SUBJECTIVE:   SUBJECTIVE STATEMENT: In-Person ASL interpreter utilized throughout session  Pt presents to PT with roughly 6 week hx of R hip pain with referral of tingling into R postero-lateral thigh. She denies trauma or MOI to hip, notes sharp pain when swinging legs off bed when she wakes up in morning. Of note, she has swelling in distal R LE that her PCP is aware of and she was started on lasix for.  PERTINENT HISTORY: HTN PAIN:  Are you having pain?  Yes: NPRS scale: 6/10 Worst: 8/10 Pain location: R hip Pain description: sharp, tingling Aggravating factors: certain movements Relieving factors: none  PRECAUTIONS: None  WEIGHT BEARING RESTRICTIONS: No  FALLS:  Has patient fallen in last 6 months? 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  PLOF: Independent  PATIENT GOALS: decrease pain to improve comfort with work as well as home ADLs and community activity  OBJECTIVE:   DIAGNOSTIC FINDINGS:   See imaging  PATIENT SURVEYS:  FOTO: will perform next session  COGNITION: Overall cognitive status: Within functional limits for tasks assessed     SENSATION: Light touch: Impaired - lateral R LE  POSTURE: rounded shoulders, forward head, and medium body habitus  PALPATION: No warmth noted to distal R LE; TTP to R gluteals, R piriformis  LOWER EXTREMITY MMT:  MMT Right eval Left eval  Hip flexion 3+/5 4/5  Hip extension    Hip abduction 3+/5 p! 4/5  Hip adduction 4/5 4/5  Hip internal rotation    Hip external rotation    Knee flexion    Knee extension    Ankle dorsiflexion    Ankle plantarflexion     Ankle inversion    Ankle eversion     (Blank rows = not tested)  LOWER EXTREMITY SPECIAL TESTS:  Hip special tests: Piriformis test: positive   FUNCTIONAL TESTS:  30 Second Sit to Stand: 6 reps  GAIT: Distance walked: 49f Assistive device utilized: None Level of assistance: Complete Independence Comments: antalgic gait on R LE   TREATMENT: [facility] Adult PT Treatment:                                                DATE: 03/18/2022 Therapeutic Exercise: Supine piriformis stretch x 30" R LTR x 5 each S/L clamshell x 10 RTB R  PATIENT EDUCATION:  Education details: eval findings, FOTO, HEP, POC Person educated: Patient Education method: Explanation, Demonstration, and Handouts Education comprehension: verbalized understanding and returned demonstration  HOME EXERCISE PROGRAM: Access Code: [email or web address] URL: [email or web address] Date: 03/18/2022 Prepared by: DOctavio Manns Exercises - Supine Piriformis Stretch with Leg Straight  - 1-2 x daily - 7 x weekly - 2 sets - 30 sec hold - Clamshell with Resistance  - 1-2 x daily - 7 x weekly - 2 sets - 10 reps - red theraband hold - Supine Lower Trunk Rotation  - 1-2 x daily - 7 x weekly - 2 sets - 10 reps  ASSESSMENT:  CLINICAL IMPRESSION: Patient is a 52y.o. F who was seen today for physical therapy evaluation and treatment for subacute R hip pain. Physical findings are consistent with PCP impression as pt has decrease in R hip strength and ROM. Her 30 Second Sit to Stand revealed decrease in functional mobility, placing her at a slightly increased risk for falls. Physical deficits along with subjective pain score indicate pt would benefit from skilled PT services working on improving strength and improving muscle length.    OBJECTIVE IMPAIRMENTS: decreased activity tolerance, decreased mobility, difficulty walking, decreased ROM, decreased strength, and pain.   ACTIVITY LIMITATIONS: carrying, lifting, standing, squatting,  stairs, and bed mobility  PARTICIPATION LIMITATIONS: driving, community activity, occupation, and yard work  PERSONAL FACTORS: 1-2 comorbidities: HTN  are also affecting patient's functional outcome.   REHAB POTENTIAL: Excellent  CLINICAL DECISION MAKING: Stable/uncomplicated  EVALUATION COMPLEXITY: Low   GOALS: Goals reviewed with patient? No  SHORT TERM GOALS: Target date: 04/08/2022   Pt will be compliant and knowledgeable with initial HEP for improved comfort and carryover Baseline: initial HEP given  Goal status: INITIAL  2.  Pt will self report R hip pain no greater than 5/10 for improved comfort and functional ability Baseline: 8/10 at worst Goal status: INITIAL   LONG TERM GOALS:  Target date: 04/29/2022   Pt will self report R hip pain no greater than 2/10 for improved comfort and functional ability Baseline: 8/10 at worst Goal status: INITIAL   2.  Pt will improve FOTO function score to no less than predicted as proxy for functional improvement Baseline: will assess next session Goal status: INITIAL   3.  Pt will increase 30 Second Sit to Stand rep count to no less than 8 reps for improved balance, strength, and functional mobility Baseline: 6 reps  Goal status: INITIAL   4.  Pt will be able to perform job duties as custodian with no limitation or increase in R hip pain for improved comfort and functional ability Baseline: unable Goal status: INITIAL  5.  Pt will improve R hip flex/abd to no less than 4/5 for improved comfort and functional mobility Baseline: see chart Goal status: INITIAL  PLAN:  PT FREQUENCY: 2x/week  PT DURATION: 6 weeks  PLANNED INTERVENTIONS: Therapeutic exercises, Therapeutic activity, Neuromuscular re-education, Balance training, Gait training, Patient/Family education, Self Care, Joint mobilization, Aquatic Therapy, Dry Needling, Cryotherapy, Moist heat, Manual therapy, and Re-evaluation  PLAN FOR NEXT SESSION: take FOTO, HEP  response, proximal hip strengthening   Referring diagnosis? M25.551 (ICD-10-CM) - Hip pain, acute, right  Treatment diagnosis? (if different than referring diagnosis)  Pain in right hip Muscle weakness (generalized) Other abnormalities of gait and mobility What was this (referring dx) caused by? '[]'$  Surgery '[]'$  Fall '[x]'$  Ongoing issue '[]'$  Arthritis '[]'$  Other: ____________  Laterality: '[x]'$  Rt '[]'$  Lt '[]'$  Both  Check all possible CPT codes:  *CHOOSE 10 OR LESS*    '[x]'$  97110 (Therapeutic Exercise)  '[]'$  92507 (SLP Treatment)  '[x]'$  91478 (Neuro Re-ed)   '[]'$  92526 (Swallowing Treatment)   '[x]'$  97116 (Gait Training)   '[]'$  [id number] (Cognitive Training, 1st 15 minutes) '[x]'$  97140 (Manual Therapy)   '[]'$  97130 (Cognitive Training, each add'l 15 minutes)  '[x]'$  97164 (Re-evaluation)                              '[]'$  Other, List CPT Code ____________  '[x]'$  97530 (Therapeutic Activities)     '[x]'$  97535 (Self Care)   '[]'$  All codes above (97110 - 97535)  '[]'$  97012 (Mechanical Traction)  '[]'$  97014 (E-stim Unattended)  '[]'$  97032 (E-stim manual)  '[]'$  97033 (Ionto)  '[]'$  97035 (Ultrasound) '[]'$  97750 (Physical Performance Training) '[]'$  [id number] (Aquatic Therapy) '[]'$  97016 (Vasopneumatic Device) '[]'$  [id number] (Paraffin) '[]'$  97034 (Contrast Bath) '[]'$  97597 (Wound Care 1st 20 sq cm) '[]'$  97598 (Wound Care each add'l 20 sq cm) '[]'$  97760 (Orthotic Fabrication, Fitting, Training Initial) '[]'$  [id number] (Prosthetic Management and Training Initial) '[]'$  [id number] (Orthotic or Prosthetic Training/ Modification Subsequent)   Ward Chatters, PT 03/18/2022, 1:58 PM

## 2022-03-25 NOTE — Therapy (Signed)
OUTPATIENT PHYSICAL THERAPY TREATMENT NOTE   Patient Name: Christy Price MRN: [id number] DOB:[birth year], 52 y.o., female Today's Date: 03/26/2022  PCP: Vevelyn Francois, NP  REFERRING PROVIDER: Fenton Foy, NP   END OF SESSION:   PT End of Session - 03/26/22 1132     Visit Number 2    Number of Visits 13    Date for PT Re-Evaluation 04/29/22    Authorization Type [employer] MCR and MCD Secondary    PT Start Time 1132    PT Stop Time 1212    PT Time Calculation (min) 40 min    Activity Tolerance Patient tolerated treatment well    Behavior During Therapy WFL for tasks assessed/performed             Past Medical History:  Diagnosis Date   Allergy    seasonal   Anxiety    Arthritis    shoulder,knees,feet   Back spasm    Cancer ([place])     hx cervical cx    Depression    GERD (gastroesophageal reflux disease)    Hypertension    Meningitis    Seizures ([place])    greater than 25 years ago   Past Surgical History:  Procedure Laterality Date   carpal tunnel Left    LAPAROSCOPIC HYSTERECTOMY     2000   SHUNT REMOVAL     2007   SHUNT REPLACEMENT     x 4   Patient Active Problem List   Diagnosis Date Noted   Anxiety and depression 02/27/2022   Language barrier 05/09/2019   Chronic pain of right knee 05/09/2019   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   Peripheral edema 11/04/2018   Essential hypertension 11/04/2018   Rash 11/04/2018   Pain in both lower legs 12/12/2016   Blepharitis of upper and lower eyelids of both eyes 09/24/2016   Keratoconjunctivitis sicca of both eyes not specified as Sjogren's 09/24/2016   Presbyopia of both eyes 09/24/2016   Vitreous floater, bilateral 09/24/2016   Partial nontraumatic tear of right rotator cuff 04/03/2016   Nail fungus 03/30/2015   Toe pain 03/30/2015   Insomnia 12/19/2014   Pedal edema 11/06/2014   Depression 11/06/2014   Genital herpes 11/06/2014   Back pain 11/06/2014    REFERRING DIAG: M25.551 (ICD-10-CM) -  Hip pain, acute, right    THERAPY DIAG:  Pain in right hip  Muscle weakness (generalized)  Other abnormalities of gait and mobility  Rationale for Evaluation and Treatment Rehabilitation  PERTINENT HISTORY: HTN   PRECAUTIONS: None   SUBJECTIVE:  SUBJECTIVE STATEMENT:  Pt presents to PT with reports continued R hip pain. Has been compliant with HEP with no adverse effect. Pt is ready to begin PT ta this time.    PAIN:  Are you having pain?  Yes: NPRS scale: 4/10 Worst: 8/10 Pain location: R hip Pain description: sharp, tingling Aggravating factors: certain movements Relieving factors: none   OBJECTIVE: (objective measures completed at initial evaluation unless otherwise dated)  DIAGNOSTIC FINDINGS:             See imaging   PATIENT SURVEYS:  FOTO: 51% function; 63% predicted    COGNITION: Overall cognitive status: Within functional limits for tasks assessed                         SENSATION: Light touch: Impaired - lateral R LE   POSTURE: rounded shoulders, forward head, and medium body habitus   PALPATION: No warmth noted to distal R LE; TTP to R gluteals, R piriformis   LOWER EXTREMITY MMT:   MMT Right eval Left eval  Hip flexion 3+/5 4/5  Hip extension      Hip abduction 3+/5 p! 4/5  Hip adduction 4/5 4/5  Hip internal rotation      Hip external rotation      Knee flexion      Knee extension      Ankle dorsiflexion      Ankle plantarflexion      Ankle inversion      Ankle eversion       (Blank rows = not tested)   LOWER EXTREMITY SPECIAL TESTS:  Hip special tests: Piriformis test: positive    FUNCTIONAL TESTS:  30 Second Sit to Stand: 6 reps   GAIT: Distance walked: 24f Assistive device utilized: None Level of assistance: Complete Independence Comments:  antalgic gait on R LE     TREATMENT: [facility] Adult PT Treatment:                                                DATE: 03/26/2022 Therapeutic Exercise: Supine piriformis stretch x 30" R LTR x 10  Bridge 2x10 Seated clamshell 2x15 GTB STS 2x10 - 4 in step under feet Seated ball squeeze 2x10 - 3" hold  [facility] Adult PT Treatment:                                                DATE: 03/18/2022 Therapeutic Exercise: Supine piriformis stretch x 30" R LTR x 5 each S/L clamshell x 10 RTB R   PATIENT EDUCATION:  Education details: eval findings, FOTO, HEP, POC Person educated: Patient Education method: Explanation, Demonstration, and Handouts Education comprehension: verbalized understanding and returned demonstration   HOME EXERCISE PROGRAM: Access Code: [email or web address] URL: [email or web address] Date: 03/18/2022 Prepared by: DOctavio Manns  Exercises - Supine Piriformis Stretch with Leg Straight  - 1-2 x daily - 7 x weekly - 2 sets - 30 sec hold - Clamshell with Resistance  - 1-2 x daily - 7 x weekly - 2 sets - 10 reps - red theraband hold - Supine Lower Trunk Rotation  - 1-2 x daily - 7 x weekly - 2 sets - 10 reps   ASSESSMENT:  CLINICAL IMPRESSION: Pt was able to complete all prescribed exercises with no adverse effect. Did note some continued pain in R hip during session with PT focusing on improving R proximal hip strength and functional mobility. Will continue to progress as able per POC.    OBJECTIVE IMPAIRMENTS: decreased activity tolerance, decreased mobility, difficulty walking, decreased ROM, decreased strength, and pain.    ACTIVITY LIMITATIONS: carrying, lifting, standing, squatting, stairs, and bed mobility   PARTICIPATION LIMITATIONS: driving, community activity, occupation, and yard work   PERSONAL FACTORS: 1-2 comorbidities: HTN  are also affecting patient's functional outcome.      GOALS: Goals reviewed with patient? No   SHORT TERM GOALS: Target date:  04/08/2022   Pt will be compliant and knowledgeable with initial HEP for improved comfort and carryover Baseline: initial HEP given  Goal status: INITIAL   2.  Pt will self report R hip pain no greater than 5/10 for improved comfort and functional ability Baseline: 8/10 at worst Goal status: INITIAL    LONG TERM GOALS: Target date: 04/29/2022   Pt will self report R hip pain no greater than 2/10 for improved comfort and functional ability Baseline: 8/10 at worst Goal status: INITIAL    2.  Pt will improve FOTO function score to no less than predicted as proxy for functional improvement Baseline: 51% function; 63% predicted  Goal status: INITIAL    3.  Pt will increase 30 Second Sit to Stand rep count to no less than 8 reps for improved balance, strength, and functional mobility Baseline: 6 reps  Goal status: INITIAL    4.  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  Pt will improve R hip flex/abd to no less than 4/5 for improved comfort and functional mobility Baseline: see chart Goal status: INITIAL   PLAN:   PT FREQUENCY: 2x/week   PT DURATION: 6 weeks   PLANNED INTERVENTIONS: Therapeutic exercises, Therapeutic activity, Neuromuscular re-education, Balance training, Gait training, Patient/Family education, Self Care, Joint mobilization, Aquatic Therapy, Dry Needling, Cryotherapy, Moist heat, Manual therapy, and Re-evaluation   PLAN FOR NEXT SESSION: take FOTO, HEP response, proximal hip strengthening    Ward Chatters, PT 03/26/2022, 12:29 PM

## 2022-03-26 ENCOUNTER — Ambulatory Visit: Payer: Medicare HMO

## 2022-03-26 DIAGNOSIS — M6281 Muscle weakness (generalized): Secondary | ICD-10-CM | POA: Diagnosis not present

## 2022-03-26 DIAGNOSIS — M25551 Pain in right hip: Secondary | ICD-10-CM | POA: Diagnosis not present

## 2022-03-26 DIAGNOSIS — R2689 Other abnormalities of gait and mobility: Secondary | ICD-10-CM

## 2022-03-28 ENCOUNTER — Ambulatory Visit: Payer: Medicare HMO

## 2022-03-28 DIAGNOSIS — R2689 Other abnormalities of gait and mobility: Secondary | ICD-10-CM

## 2022-03-28 DIAGNOSIS — M25551 Pain in right hip: Secondary | ICD-10-CM

## 2022-03-28 DIAGNOSIS — M6281 Muscle weakness (generalized): Secondary | ICD-10-CM | POA: Diagnosis not present

## 2022-03-28 NOTE — Therapy (Signed)
OUTPATIENT PHYSICAL THERAPY TREATMENT NOTE   Patient Name: Christy Price MRN: [id number] DOB:1970-11-15, 52 y.o., female Today's Date: 03/28/2022  PCP: Vevelyn Francois, NP  REFERRING PROVIDER: Fenton Foy, NP   END OF SESSION:   PT End of Session - 03/28/22 1129     Visit Number 3    Number of Visits 13    Date for PT Re-Evaluation 04/29/22    Authorization Type [employer] MCR and MCD Secondary    PT Start Time 1130    PT Stop Time 1208    PT Time Calculation (min) 38 min    Activity Tolerance Patient tolerated treatment well    Behavior During Therapy WFL for tasks assessed/performed              Past Medical History:  Diagnosis Date   Allergy    seasonal   Anxiety    Arthritis    shoulder,knees,feet   Back spasm    Cancer ([place])     hx cervical cx    Depression    GERD (gastroesophageal reflux disease)    Hypertension    Meningitis    Seizures ([place])    greater than 25 years ago   Past Surgical History:  Procedure Laterality Date   carpal tunnel Left    LAPAROSCOPIC HYSTERECTOMY     2000   SHUNT REMOVAL     2007   SHUNT REPLACEMENT     x 4   Patient Active Problem List   Diagnosis Date Noted   Anxiety and depression 02/27/2022   Language barrier 05/09/2019   Chronic pain of right knee 05/09/2019   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   Peripheral edema 11/04/2018   Essential hypertension 11/04/2018   Rash 11/04/2018   Pain in both lower legs 12/12/2016   Blepharitis of upper and lower eyelids of both eyes 09/24/2016   Keratoconjunctivitis sicca of both eyes not specified as Sjogren's 09/24/2016   Presbyopia of both eyes 09/24/2016   Vitreous floater, bilateral 09/24/2016   Partial nontraumatic tear of right rotator cuff 04/03/2016   Nail fungus 03/30/2015   Toe pain 03/30/2015   Insomnia 12/19/2014   Pedal edema 11/06/2014   Depression 11/06/2014   Genital herpes 11/06/2014   Back pain 11/06/2014    REFERRING DIAG: M25.551 (ICD-10-CM) -  Hip pain, acute, right    THERAPY DIAG:  Pain in right hip  Muscle weakness (generalized)  Other abnormalities of gait and mobility  Rationale for Evaluation and Treatment Rehabilitation  PERTINENT HISTORY: HTN   PRECAUTIONS: None   SUBJECTIVE:  SUBJECTIVE STATEMENT:  Pt presents to PT with reports of increased hip pain and tingling down the R LE. Pt has been compliant with HEP. Ready to begin PT at this time.    PAIN:  Are you having pain?  Yes: NPRS scale: 4/10 Worst: 8/10 Pain location: R hip Pain description: sharp, tingling Aggravating factors: certain movements Relieving factors: none   OBJECTIVE: (objective measures completed at initial evaluation unless otherwise dated)  DIAGNOSTIC FINDINGS:             See imaging   PATIENT SURVEYS:  FOTO: 51% function; 63% predicted    COGNITION: Overall cognitive status: Within functional limits for tasks assessed                         SENSATION: Light touch: Impaired - lateral R LE   POSTURE: rounded shoulders, forward head, and medium body habitus   PALPATION: No warmth noted to distal R LE; TTP to R gluteals, R piriformis   LOWER EXTREMITY MMT:   MMT Right eval Left eval  Hip flexion 3+/5 4/5  Hip extension      Hip abduction 3+/5 p! 4/5  Hip adduction 4/5 4/5  Hip internal rotation      Hip external rotation      Knee flexion      Knee extension      Ankle dorsiflexion      Ankle plantarflexion      Ankle inversion      Ankle eversion       (Blank rows = not tested)   LOWER EXTREMITY SPECIAL TESTS:  Hip special tests: Piriformis test: positive    FUNCTIONAL TESTS:  30 Second Sit to Stand: 6 reps   GAIT: Distance walked: 3f Assistive device utilized: None Level of assistance: Complete Independence Comments:  antalgic gait on R LE     TREATMENT: [facility] Adult PT Treatment:                                                DATE: 03/28/2022 Therapeutic Exercise: Supine piriformis stretch x 30" R LTR x 10  Bridge 2x10 Supine clamshell 2x15 GTB STS 2x10 - 4 in step under feet Seated ball squeeze 2x10 - 3" hold Standing hip abd/ext 2x10 - increased back pain  [facility] Adult PT Treatment:                                                DATE: 03/26/2022 Therapeutic Exercise: Supine piriformis stretch x 30" R LTR x 10  Bridge 2x10 Seated clamshell 2x15 GTB STS 2x10 - 4 in step under feet Seated ball squeeze 2x10 - 3" hold  [facility] Adult PT Treatment:                                                DATE: 03/18/2022 Therapeutic Exercise: Supine piriformis stretch x 30" R LTR x 5 each S/L clamshell x 10 RTB R   PATIENT EDUCATION:  Education details: eval findings, FOTO, HEP, POC Person educated: Patient Education method: Explanation, Demonstration, and  Handouts Education comprehension: verbalized understanding and returned demonstration   HOME EXERCISE PROGRAM: Access Code: [email or web address] URL: [email or web address] Date: 03/18/2022 Prepared by: Octavio Manns   Exercises - Supine Piriformis Stretch with Leg Straight  - 1-2 x daily - 7 x weekly - 2 sets - 30 sec hold - Clamshell with Resistance  - 1-2 x daily - 7 x weekly - 2 sets - 10 reps - red theraband hold - Supine Lower Trunk Rotation  - 1-2 x daily - 7 x weekly - 2 sets - 10 reps   ASSESSMENT:   CLINICAL IMPRESSION: Pt was able to complete prescribed exercises, continues to be limited by pain. Therapy focused on improving proximal hip strength and muscle length. PT will continue to progress as able per POC.    OBJECTIVE IMPAIRMENTS: decreased activity tolerance, decreased mobility, difficulty walking, decreased ROM, decreased strength, and pain.    ACTIVITY LIMITATIONS: carrying, lifting, standing, squatting, stairs, and bed mobility    PARTICIPATION LIMITATIONS: driving, community activity, occupation, and yard work   PERSONAL FACTORS: 1-2 comorbidities: HTN  are also affecting patient's functional outcome.      GOALS: Goals reviewed with patient? No   SHORT TERM GOALS: Target date: 04/08/2022   Pt will be compliant and knowledgeable with initial HEP for improved comfort and carryover Baseline: initial HEP given  Goal status: INITIAL   2.  Pt will self report R hip pain no greater than 5/10 for improved comfort and functional ability Baseline: 8/10 at worst Goal status: INITIAL    LONG TERM GOALS: Target date: 04/29/2022   Pt will self report R hip pain no greater than 2/10 for improved comfort and functional ability Baseline: 8/10 at worst Goal status: INITIAL    2.  Pt will improve FOTO function score to no less than predicted as proxy for functional improvement Baseline: 51% function; 63% predicted  Goal status: INITIAL    3.  Pt will increase 30 Second Sit to Stand rep count to no less than 8 reps for improved balance, strength, and functional mobility Baseline: 6 reps  Goal status: INITIAL    4.  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  Pt will improve R hip flex/abd to no less than 4/5 for improved comfort and functional mobility Baseline: see chart Goal status: INITIAL   PLAN:   PT FREQUENCY: 2x/week   PT DURATION: 6 weeks   PLANNED INTERVENTIONS: Therapeutic exercises, Therapeutic activity, Neuromuscular re-education, Balance training, Gait training, Patient/Family education, Self Care, Joint mobilization, Aquatic Therapy, Dry Needling, Cryotherapy, Moist heat, Manual therapy, and Re-evaluation   PLAN FOR NEXT SESSION: take FOTO, HEP response, proximal hip strengthening    Ward Chatters, PT 03/28/2022, 1:25 PM

## 2022-04-02 ENCOUNTER — Ambulatory Visit: Payer: Medicare HMO

## 2022-04-04 ENCOUNTER — Ambulatory Visit: Payer: Medicare HMO

## 2022-04-04 DIAGNOSIS — M6281 Muscle weakness (generalized): Secondary | ICD-10-CM | POA: Diagnosis not present

## 2022-04-04 DIAGNOSIS — M25551 Pain in right hip: Secondary | ICD-10-CM

## 2022-04-04 DIAGNOSIS — R2689 Other abnormalities of gait and mobility: Secondary | ICD-10-CM | POA: Diagnosis not present

## 2022-04-04 NOTE — Therapy (Signed)
OUTPATIENT PHYSICAL THERAPY TREATMENT NOTE   Patient Name: Christy Price MRN: [id number] DOB:[birth year], 52 y.o., female Today's Date: 04/04/2022  PCP: Vevelyn Francois, NP  REFERRING PROVIDER: Fenton Foy, NP   END OF SESSION:   PT End of Session - 04/04/22 1336     Visit Number 4    Number of Visits 13    Date for PT Re-Evaluation 04/29/22    Authorization Type [employer] MCR and MCD Secondary    PT Start Time 1130    PT Stop Time 1210    PT Time Calculation (min) 40 min    Activity Tolerance Patient tolerated treatment well    Behavior During Therapy WFL for tasks assessed/performed               Past Medical History:  Diagnosis Date   Allergy    seasonal   Anxiety    Arthritis    shoulder,knees,feet   Back spasm    Cancer ([place])     hx cervical cx    Depression    GERD (gastroesophageal reflux disease)    Hypertension    Meningitis    Seizures ([place])    greater than 25 years ago   Past Surgical History:  Procedure Laterality Date   carpal tunnel Left    LAPAROSCOPIC HYSTERECTOMY     2000   SHUNT REMOVAL     2007   SHUNT REPLACEMENT     x 4   Patient Active Problem List   Diagnosis Date Noted   Anxiety and depression 02/27/2022   Language barrier 05/09/2019   Chronic pain of right knee 05/09/2019   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   Peripheral edema 11/04/2018   Essential hypertension 11/04/2018   Rash 11/04/2018   Pain in both lower legs 12/12/2016   Blepharitis of upper and lower eyelids of both eyes 09/24/2016   Keratoconjunctivitis sicca of both eyes not specified as Sjogren's 09/24/2016   Presbyopia of both eyes 09/24/2016   Vitreous floater, bilateral 09/24/2016   Partial nontraumatic tear of right rotator cuff 04/03/2016   Nail fungus 03/30/2015   Toe pain 03/30/2015   Insomnia 12/19/2014   Pedal edema 11/06/2014   Depression 11/06/2014   Genital herpes 11/06/2014   Back pain 11/06/2014    REFERRING DIAG: M25.551 (ICD-10-CM)  - Hip pain, acute, right    THERAPY DIAG:  Pain in right hip  Muscle weakness (generalized)  Other abnormalities of gait and mobility  Rationale for Evaluation and Treatment Rehabilitation  PERTINENT HISTORY: HTN   PRECAUTIONS: None   SUBJECTIVE:  SUBJECTIVE STATEMENT:  Pt presents to PT with continued reports of R hip and thigh pain. Has been compliant with HEP with no adverse effect. Pt is ready to begin PT at this time.    PAIN:  Are you having pain?  Yes: NPRS scale: 8/10 Worst: 8/10 Pain location: R hip Pain description: sharp, tingling Aggravating factors: certain movements Relieving factors: none   OBJECTIVE: (objective measures completed at initial evaluation unless otherwise dated)  DIAGNOSTIC FINDINGS:             See imaging   PATIENT SURVEYS:  FOTO: 51% function; 63% predicted    COGNITION: Overall cognitive status: Within functional limits for tasks assessed                         SENSATION: Light touch: Impaired - lateral R LE   POSTURE: rounded shoulders, forward head, and medium body habitus   PALPATION: No warmth noted to distal R LE; TTP to R gluteals, R piriformis   LOWER EXTREMITY MMT:   MMT Right eval Left eval  Hip flexion 3+/5 4/5  Hip extension      Hip abduction 3+/5 p! 4/5  Hip adduction 4/5 4/5  Hip internal rotation      Hip external rotation      Knee flexion      Knee extension      Ankle dorsiflexion      Ankle plantarflexion      Ankle inversion      Ankle eversion       (Blank rows = not tested)   LOWER EXTREMITY SPECIAL TESTS:  Hip special tests: Piriformis test: positive    FUNCTIONAL TESTS:  30 Second Sit to Stand: 6 reps   GAIT: Distance walked: 60f Assistive device utilized: None Level of assistance: Complete  Independence Comments: antalgic gait on R LE     TREATMENT: [facility] Adult PT Treatment:                                                DATE: 04/04/2022 Therapeutic Exercise: LTR x 10  Bridge 2x10 Seated clamshell 2x15 GTB STS 2x10 - 4 in step under feet Standing mini squat x 10  Standing hip abd/ext 2x10  [facility] Adult PT Treatment:                                                DATE: 03/28/2022 Therapeutic Exercise: Supine piriformis stretch x 30" R LTR x 10  Bridge 2x10 Supine clamshell 2x15 GTB STS 2x10 - 4 in step under feet Seated ball squeeze 2x10 - 3" hold Standing hip abd/ext 2x10 - increased back pain  [facility] Adult PT Treatment:                                                DATE: 03/26/2022 Therapeutic Exercise: Supine piriformis stretch x 30" R LTR x 10  Bridge 2x10 Seated clamshell 2x15 GTB STS 2x10 - 4 in step under feet Seated ball squeeze 2x10 - 3" hold  [facility] Adult PT Treatment:  DATE: 03/18/2022 Therapeutic Exercise: Supine piriformis stretch x 30" R LTR x 5 each S/L clamshell x 10 RTB R   PATIENT EDUCATION:  Education details: eval findings, FOTO, HEP, POC Person educated: Patient Education method: Explanation, Demonstration, and Handouts Education comprehension: verbalized understanding and returned demonstration   HOME EXERCISE PROGRAM: Access Code: [email or web address] URL: [email or web address] Date: 04/04/2022 Prepared by: Octavio Manns  Exercises - Supine Piriformis Stretch with Leg Straight  - 1-2 x daily - 7 x weekly - 2 sets - 30 sec hold - Clamshell with Resistance  - 1-2 x daily - 7 x weekly - 2 sets - 10 reps - red theraband hold - Supine Lower Trunk Rotation  - 1-2 x daily - 7 x weekly - 2 sets - 10 reps - Standing Hip Abduction with Counter Support  - 1 x daily - 7 x weekly - 2 sets - 10 reps - Standing Hip Extension with Counter Support  - 1 x daily - 7 x weekly - 2 sets - 10 reps    ASSESSMENT:   CLINICAL IMPRESSION: Pt was able to complete all prescribed exercises. HEP updated for continued proximal hip strengthening. Will continue to progress per POC.    OBJECTIVE IMPAIRMENTS: decreased activity tolerance, decreased mobility, difficulty walking, decreased ROM, decreased strength, and pain.    ACTIVITY LIMITATIONS: carrying, lifting, standing, squatting, stairs, and bed mobility   PARTICIPATION LIMITATIONS: driving, community activity, occupation, and yard work   PERSONAL FACTORS: 1-2 comorbidities: HTN  are also affecting patient's functional outcome.      GOALS: Goals reviewed with patient? No   SHORT TERM GOALS: Target date: 04/08/2022   Pt will be compliant and knowledgeable with initial HEP for improved comfort and carryover Baseline: initial HEP given  Goal status: INITIAL   2.  Pt will self report R hip pain no greater than 5/10 for improved comfort and functional ability Baseline: 8/10 at worst Goal status: INITIAL    LONG TERM GOALS: Target date: 04/29/2022   Pt will self report R hip pain no greater than 2/10 for improved comfort and functional ability Baseline: 8/10 at worst Goal status: INITIAL    2.  Pt will improve FOTO function score to no less than predicted as proxy for functional improvement Baseline: 51% function; 63% predicted  Goal status: INITIAL    3.  Pt will increase 30 Second Sit to Stand rep count to no less than 8 reps for improved balance, strength, and functional mobility Baseline: 6 reps  Goal status: INITIAL    4.  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  Pt will improve R hip flex/abd to no less than 4/5 for improved comfort and functional mobility Baseline: see chart Goal status: INITIAL   PLAN:   PT FREQUENCY: 2x/week   PT DURATION: 6 weeks   PLANNED INTERVENTIONS:  Therapeutic exercises, Therapeutic activity, Neuromuscular re-education, Balance training, Gait training, Patient/Family education, Self Care, Joint mobilization, Aquatic Therapy, Dry Needling, Cryotherapy, Moist heat, Manual therapy, and Re-evaluation   PLAN FOR NEXT SESSION: take FOTO, HEP response, proximal hip strengthening    Ward Chatters, PT 04/04/2022, 1:39 PM

## 2022-04-09 ENCOUNTER — Ambulatory Visit: Payer: Medicare HMO

## 2022-04-09 DIAGNOSIS — R2689 Other abnormalities of gait and mobility: Secondary | ICD-10-CM

## 2022-04-09 DIAGNOSIS — M25551 Pain in right hip: Secondary | ICD-10-CM | POA: Diagnosis not present

## 2022-04-09 DIAGNOSIS — M6281 Muscle weakness (generalized): Secondary | ICD-10-CM | POA: Diagnosis not present

## 2022-04-09 NOTE — Therapy (Signed)
OUTPATIENT PHYSICAL THERAPY TREATMENT NOTE   Patient Name: Christy Price MRN: [id number] DOB:Aug 30, 1970, 52 y.o., female Today's Date: 04/09/2022  PCP: Vevelyn Francois, NP  REFERRING PROVIDER: Fenton Foy, NP   END OF SESSION:   PT End of Session - 04/09/22 1131     Visit Number 5    Number of Visits 13    Date for PT Re-Evaluation 04/29/22    Authorization Type [employer] MCR and MCD Secondary    PT Start Time 1131    PT Stop Time 1210    PT Time Calculation (min) 39 min    Activity Tolerance Patient tolerated treatment well    Behavior During Therapy WFL for tasks assessed/performed                Past Medical History:  Diagnosis Date   Allergy    seasonal   Anxiety    Arthritis    shoulder,knees,feet   Back spasm    Cancer ([place])     hx cervical cx    Depression    GERD (gastroesophageal reflux disease)    Hypertension    Meningitis    Seizures ([place])    greater than 25 years ago   Past Surgical History:  Procedure Laterality Date   carpal tunnel Left    LAPAROSCOPIC HYSTERECTOMY     2000   SHUNT REMOVAL     2007   SHUNT REPLACEMENT     x 4   Patient Active Problem List   Diagnosis Date Noted   Anxiety and depression 02/27/2022   Language barrier 05/09/2019   Chronic pain of right knee 05/09/2019   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   Peripheral edema 11/04/2018   Essential hypertension 11/04/2018   Rash 11/04/2018   Pain in both lower legs 12/12/2016   Blepharitis of upper and lower eyelids of both eyes 09/24/2016   Keratoconjunctivitis sicca of both eyes not specified as Sjogren's 09/24/2016   Presbyopia of both eyes 09/24/2016   Vitreous floater, bilateral 09/24/2016   Partial nontraumatic tear of right rotator cuff 04/03/2016   Nail fungus 03/30/2015   Toe pain 03/30/2015   Insomnia 12/19/2014   Pedal edema 11/06/2014   Depression 11/06/2014   Genital herpes 11/06/2014   Back pain 11/06/2014    REFERRING DIAG: M25.551  (ICD-10-CM) - Hip pain, acute, right    THERAPY DIAG:  Pain in right hip  Muscle weakness (generalized)  Other abnormalities of gait and mobility  Rationale for Evaluation and Treatment Rehabilitation  PERTINENT HISTORY: HTN   PRECAUTIONS: None   SUBJECTIVE:  SUBJECTIVE STATEMENT:  Pt presents to PT with continued reports of R hip and thigh pain. Has been compliant with HEP with no adverse effect. Pt is ready to begin PT at this time.    PAIN:  Are you having pain?  Yes: NPRS scale: 8/10 Worst: 8/10 Pain location: R hip Pain description: sharp, tingling Aggravating factors: certain movements Relieving factors: none   OBJECTIVE: (objective measures completed at initial evaluation unless otherwise dated)  DIAGNOSTIC FINDINGS:             See imaging   PATIENT SURVEYS:  FOTO: 51% function; 63% predicted    COGNITION: Overall cognitive status: Within functional limits for tasks assessed                         SENSATION: Light touch: Impaired - lateral R LE   POSTURE: rounded shoulders, forward head, and medium body habitus   PALPATION: No warmth noted to distal R LE; TTP to R gluteals, R piriformis   LOWER EXTREMITY MMT:   MMT Right eval Left eval  Hip flexion 3+/5 4/5  Hip extension      Hip abduction 3+/5 p! 4/5  Hip adduction 4/5 4/5  Hip internal rotation      Hip external rotation      Knee flexion      Knee extension      Ankle dorsiflexion      Ankle plantarflexion      Ankle inversion      Ankle eversion       (Blank rows = not tested)   LOWER EXTREMITY SPECIAL TESTS:  Hip special tests: Piriformis test: positive    FUNCTIONAL TESTS:  30 Second Sit to Stand: 6 reps   GAIT: Distance walked: 47f Assistive device utilized: None Level of assistance: Complete  Independence Comments: antalgic gait on R LE     TREATMENT: [facility] Adult PT Treatment:                                                DATE: 04/09/2022 Therapeutic Exercise: STS 2x10 - 4in step Seated fig 4 - painful Seated clamshell 2x15 GTB LTR x 10  Bridge 2x10 Standing mini squat x 10  Standing hip abd/ext 2x10  [facility] Adult PT Treatment:                                                DATE: 04/04/2022 Therapeutic Exercise: LTR x 10  Bridge 2x10 Seated clamshell 2x15 GTB STS 2x10 - 4 in step under feet Standing mini squat x 10  Standing hip abd/ext 2x10  [facility] Adult PT Treatment:                                                DATE: 03/28/2022 Therapeutic Exercise: Supine piriformis stretch x 30" R LTR x 10  Bridge 2x10 Supine clamshell 2x15 GTB STS 2x10 - 4 in step under feet Seated ball squeeze 2x10 - 3" hold Standing hip abd/ext 2x10 - increased back pain  [facility] Adult PT Treatment:  DATE: 03/26/2022 Therapeutic Exercise: Supine piriformis stretch x 30" R LTR x 10  Bridge 2x10 Seated clamshell 2x15 GTB STS 2x10 - 4 in step under feet Seated ball squeeze 2x10 - 3" hold  [facility] Adult PT Treatment:                                                DATE: 03/18/2022 Therapeutic Exercise: Supine piriformis stretch x 30" R LTR x 5 each S/L clamshell x 10 RTB R   PATIENT EDUCATION:  Education details: eval findings, FOTO, HEP, POC Person educated: Patient Education method: Explanation, Demonstration, and Handouts Education comprehension: verbalized understanding and returned demonstration   HOME EXERCISE PROGRAM: Access Code: [email or web address] URL: [email or web address] Date: 04/04/2022 Prepared by: Octavio Manns  Exercises - Supine Piriformis Stretch with Leg Straight  - 1-2 x daily - 7 x weekly - 2 sets - 30 sec hold - Clamshell with Resistance  - 1-2 x daily - 7 x weekly - 2 sets - 10 reps - red theraband hold - Supine  Lower Trunk Rotation  - 1-2 x daily - 7 x weekly - 2 sets - 10 reps - Standing Hip Abduction with Counter Support  - 1 x daily - 7 x weekly - 2 sets - 10 reps - Standing Hip Extension with Counter Support  - 1 x daily - 7 x weekly - 2 sets - 10 reps   ASSESSMENT:   CLINICAL IMPRESSION: Pt was able to complete all prescribed exercises. Continues to have R hip pain. HEP updated for continued proximal hip strengthening. Will continue to progress per POC.     OBJECTIVE IMPAIRMENTS: decreased activity tolerance, decreased mobility, difficulty walking, decreased ROM, decreased strength, and pain.    ACTIVITY LIMITATIONS: carrying, lifting, standing, squatting, stairs, and bed mobility   PARTICIPATION LIMITATIONS: driving, community activity, occupation, and yard work   PERSONAL FACTORS: 1-2 comorbidities: HTN  are also affecting patient's functional outcome.      GOALS: Goals reviewed with patient? No   SHORT TERM GOALS: Target date: 04/08/2022   Pt will be compliant and knowledgeable with initial HEP for improved comfort and carryover Baseline: initial HEP given  Goal status: INITIAL   2.  Pt will self report R hip pain no greater than 5/10 for improved comfort and functional ability Baseline: 8/10 at worst Goal status: INITIAL    LONG TERM GOALS: Target date: 04/29/2022   Pt will self report R hip pain no greater than 2/10 for improved comfort and functional ability Baseline: 8/10 at worst Goal status: INITIAL    2.  Pt will improve FOTO function score to no less than predicted as proxy for functional improvement Baseline: 51% function; 63% predicted  Goal status: INITIAL    3.  Pt will increase 30 Second Sit to Stand rep count to no less than 8 reps for improved balance, strength, and functional mobility Baseline: 6 reps  Goal status: INITIAL    4.  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  Pt will improve R hip flex/abd to no less than 4/5 for improved comfort and functional mobility Baseline: see chart Goal status: INITIAL   PLAN:   PT FREQUENCY: 2x/week   PT  DURATION: 6 weeks   PLANNED INTERVENTIONS: Therapeutic exercises, Therapeutic activity, Neuromuscular re-education, Balance training, Gait training, Patient/Family education, Self Care, Joint mobilization, Aquatic Therapy, Dry Needling, Cryotherapy, Moist heat, Manual therapy, and Re-evaluation   PLAN FOR NEXT SESSION: take FOTO, HEP response, proximal hip strengthening    Ward Chatters, PT 04/09/2022, 12:15 PM

## 2022-04-11 ENCOUNTER — Ambulatory Visit: Payer: Medicare HMO | Attending: Nurse Practitioner

## 2022-04-11 DIAGNOSIS — M6281 Muscle weakness (generalized): Secondary | ICD-10-CM | POA: Diagnosis not present

## 2022-04-11 DIAGNOSIS — R2689 Other abnormalities of gait and mobility: Secondary | ICD-10-CM | POA: Insufficient documentation

## 2022-04-11 DIAGNOSIS — M25551 Pain in right hip: Secondary | ICD-10-CM | POA: Diagnosis not present

## 2022-04-11 NOTE — Therapy (Signed)
OUTPATIENT PHYSICAL THERAPY TREATMENT NOTE   Patient Name: Christy Price MRN: [id number] DOB:07-Jun-1970, 52 y.o., female Today's Date: 04/11/2022  PCP: Vevelyn Francois, NP  REFERRING PROVIDER: Fenton Foy, NP   END OF SESSION:   PT End of Session - 04/11/22 1131     Visit Number 6    Number of Visits 13    Date for PT Re-Evaluation 04/29/22    Authorization Type [employer] MCR and MCD Secondary    PT Start Time 1132    PT Stop Time 1214    PT Time Calculation (min) 42 min    Activity Tolerance Patient tolerated treatment well    Behavior During Therapy WFL for tasks assessed/performed                 Past Medical History:  Diagnosis Date   Allergy    seasonal   Anxiety    Arthritis    shoulder,knees,feet   Back spasm    Cancer ([place])     hx cervical cx    Depression    GERD (gastroesophageal reflux disease)    Hypertension    Meningitis    Seizures ([place])    greater than 25 years ago   Past Surgical History:  Procedure Laterality Date   carpal tunnel Left    LAPAROSCOPIC HYSTERECTOMY     2000   SHUNT REMOVAL     2007   SHUNT REPLACEMENT     x 4   Patient Active Problem List   Diagnosis Date Noted   Anxiety and depression 02/27/2022   Language barrier 05/09/2019   Chronic pain of right knee 05/09/2019   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   Peripheral edema 11/04/2018   Essential hypertension 11/04/2018   Rash 11/04/2018   Pain in both lower legs 12/12/2016   Blepharitis of upper and lower eyelids of both eyes 09/24/2016   Keratoconjunctivitis sicca of both eyes not specified as Sjogren's 09/24/2016   Presbyopia of both eyes 09/24/2016   Vitreous floater, bilateral 09/24/2016   Partial nontraumatic tear of right rotator cuff 04/03/2016   Nail fungus 03/30/2015   Toe pain 03/30/2015   Insomnia 12/19/2014   Pedal edema 11/06/2014   Depression 11/06/2014   Genital herpes 11/06/2014   Back pain 11/06/2014    REFERRING DIAG: M25.551  (ICD-10-CM) - Hip pain, acute, right    THERAPY DIAG:  Pain in right hip  Muscle weakness (generalized)  Other abnormalities of gait and mobility  Rationale for Evaluation and Treatment Rehabilitation  PERTINENT HISTORY: HTN   PRECAUTIONS: None   SUBJECTIVE:  SUBJECTIVE STATEMENT:  Pt presents to PT with continued reports of R hip and thigh pain although it is less today. Has been compliant with HEP with no adverse effect.   PAIN:  Are you having pain?  Yes: NPRS scale: 3/10 Worst: 8/10 Pain location: R hip Pain description: sharp, tingling Aggravating factors: certain movements Relieving factors: none   OBJECTIVE: (objective measures completed at initial evaluation unless otherwise dated)  DIAGNOSTIC FINDINGS:             See imaging   PATIENT SURVEYS:  FOTO: 51% function; 63% predicted    COGNITION: Overall cognitive status: Within functional limits for tasks assessed                         SENSATION: Light touch: Impaired - lateral R LE   POSTURE: rounded shoulders, forward head, and medium body habitus   PALPATION: No warmth noted to distal R LE; TTP to R gluteals, R piriformis   LOWER EXTREMITY MMT:   MMT Right eval Left eval  Hip flexion 3+/5 4/5  Hip extension      Hip abduction 3+/5 p! 4/5  Hip adduction 4/5 4/5  Hip internal rotation      Hip external rotation      Knee flexion      Knee extension      Ankle dorsiflexion      Ankle plantarflexion      Ankle inversion      Ankle eversion       (Blank rows = not tested)   LOWER EXTREMITY SPECIAL TESTS:  Hip special tests: Piriformis test: positive    FUNCTIONAL TESTS:  30 Second Sit to Stand: 6 reps   GAIT: Distance walked: 22f Assistive device utilized: None Level of assistance: Complete  Independence Comments: antalgic gait on R LE     TREATMENT: [facility] Adult PT Treatment:                                                DATE: 04/09/2022 Therapeutic Exercise: NuStep lvl 5 UE/LE x 4 min while taking subjective LTR x 10 Bridge 2x10 Supine clamshell 2x15 GTB STS 2x10 - 4in step Standing hip abd 2x10 YTB Standing mini squats x 15  [facility] Adult PT Treatment:                                                DATE: 04/04/2022 Therapeutic Exercise: LTR x 10  Bridge 2x10 Seated clamshell 2x15 GTB STS 2x10 - 4 in step under feet Standing mini squat x 10  Standing hip abd/ext 2x10  [facility] Adult PT Treatment:                                                DATE: 03/28/2022 Therapeutic Exercise: Supine piriformis stretch x 30" R LTR x 10  Bridge 2x10 Supine clamshell 2x15 GTB STS 2x10 - 4 in step under feet Seated ball squeeze 2x10 - 3" hold Standing hip abd/ext 2x10 - increased back pain  [facility] Adult PT Treatment:  DATE: 03/26/2022 Therapeutic Exercise: Supine piriformis stretch x 30" R LTR x 10  Bridge 2x10 Seated clamshell 2x15 GTB STS 2x10 - 4 in step under feet Seated ball squeeze 2x10 - 3" hold  [facility] Adult PT Treatment:                                                DATE: 03/18/2022 Therapeutic Exercise: Supine piriformis stretch x 30" R LTR x 5 each S/L clamshell x 10 RTB R   PATIENT EDUCATION:  Education details: eval findings, FOTO, HEP, POC Person educated: Patient Education method: Explanation, Demonstration, and Handouts Education comprehension: verbalized understanding and returned demonstration   HOME EXERCISE PROGRAM: Access Code: [email or web address] URL: [email or web address] Date: 04/04/2022 Prepared by: Octavio Manns  Exercises - Supine Piriformis Stretch with Leg Straight  - 1-2 x daily - 7 x weekly - 2 sets - 30 sec hold - Clamshell with Resistance  - 1-2 x daily - 7 x weekly - 2 sets - 10 reps - red  theraband hold - Supine Lower Trunk Rotation  - 1-2 x daily - 7 x weekly - 2 sets - 10 reps - Standing Hip Abduction with Counter Support  - 1 x daily - 7 x weekly - 2 sets - 10 reps - Standing Hip Extension with Counter Support  - 1 x daily - 7 x weekly - 2 sets - 10 reps   ASSESSMENT:   CLINICAL IMPRESSION: Pt was able to complete all prescribed exercises. Was able to progress better today with improved comfort and functional mobility.Therapy focused on improving proximal hip strength. Will continue to progress per POC.      OBJECTIVE IMPAIRMENTS: decreased activity tolerance, decreased mobility, difficulty walking, decreased ROM, decreased strength, and pain.    ACTIVITY LIMITATIONS: carrying, lifting, standing, squatting, stairs, and bed mobility   PARTICIPATION LIMITATIONS: driving, community activity, occupation, and yard work   PERSONAL FACTORS: 1-2 comorbidities: HTN  are also affecting patient's functional outcome.      GOALS: Goals reviewed with patient? No   SHORT TERM GOALS: Target date: 04/08/2022   Pt will be compliant and knowledgeable with initial HEP for improved comfort and carryover Baseline: initial HEP given  Goal status: INITIAL   2.  Pt will self report R hip pain no greater than 5/10 for improved comfort and functional ability Baseline: 8/10 at worst Goal status: INITIAL    LONG TERM GOALS: Target date: 04/29/2022   Pt will self report R hip pain no greater than 2/10 for improved comfort and functional ability Baseline: 8/10 at worst Goal status: INITIAL    2.  Pt will improve FOTO function score to no less than predicted as proxy for functional improvement Baseline: 51% function; 63% predicted  Goal status: INITIAL    3.  Pt will increase 30 Second Sit to Stand rep count to no less than 8 reps for improved balance, strength, and functional mobility Baseline: 6 reps  Goal status: INITIAL    4.  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  Pt will improve R hip flex/abd to no less than 4/5 for improved comfort and functional mobility Baseline: see chart Goal status: INITIAL   PLAN:  PT FREQUENCY: 2x/week   PT DURATION: 6 weeks   PLANNED INTERVENTIONS: Therapeutic exercises, Therapeutic activity, Neuromuscular re-education, Balance training, Gait training, Patient/Family education, Self Care, Joint mobilization, Aquatic Therapy, Dry Needling, Cryotherapy, Moist heat, Manual therapy, and Re-evaluation   PLAN FOR NEXT SESSION: take FOTO, HEP response, proximal hip strengthening    Ward Chatters, PT 04/11/2022, 12:24 PM

## 2022-04-16 ENCOUNTER — Ambulatory Visit: Payer: Medicare HMO

## 2022-04-16 DIAGNOSIS — M6281 Muscle weakness (generalized): Secondary | ICD-10-CM | POA: Diagnosis not present

## 2022-04-16 DIAGNOSIS — M25551 Pain in right hip: Secondary | ICD-10-CM | POA: Diagnosis not present

## 2022-04-16 DIAGNOSIS — R2689 Other abnormalities of gait and mobility: Secondary | ICD-10-CM | POA: Diagnosis not present

## 2022-04-16 NOTE — Therapy (Signed)
OUTPATIENT PHYSICAL THERAPY TREATMENT NOTE   Patient Name: Christy Price MRN: [id number] DOB:1970-12-12, 52 y.o., female Today's Date: 04/16/2022  PCP: Vevelyn Francois, NP  REFERRING PROVIDER: Fenton Foy, NP   END OF SESSION:   PT End of Session - 04/16/22 1128     Visit Number 7    Number of Visits 13    Date for PT Re-Evaluation 04/29/22    Authorization Type [employer] MCR and MCD Secondary    PT Start Time 1130    PT Stop Time 1210    PT Time Calculation (min) 40 min    Activity Tolerance Patient tolerated treatment well    Behavior During Therapy WFL for tasks assessed/performed             Past Medical History:  Diagnosis Date   Allergy    seasonal   Anxiety    Arthritis    shoulder,knees,feet   Back spasm    Cancer ([place])     hx cervical cx    Depression    GERD (gastroesophageal reflux disease)    Hypertension    Meningitis    Seizures ([place])    greater than 25 years ago   Past Surgical History:  Procedure Laterality Date   carpal tunnel Left    LAPAROSCOPIC HYSTERECTOMY     2000   SHUNT REMOVAL     2007   SHUNT REPLACEMENT     x 4   Patient Active Problem List   Diagnosis Date Noted   Anxiety and depression 02/27/2022   Language barrier 05/09/2019   Chronic pain of right knee 05/09/2019   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   Peripheral edema 11/04/2018   Essential hypertension 11/04/2018   Rash 11/04/2018   Pain in both lower legs 12/12/2016   Blepharitis of upper and lower eyelids of both eyes 09/24/2016   Keratoconjunctivitis sicca of both eyes not specified as Sjogren's 09/24/2016   Presbyopia of both eyes 09/24/2016   Vitreous floater, bilateral 09/24/2016   Partial nontraumatic tear of right rotator cuff 04/03/2016   Nail fungus 03/30/2015   Toe pain 03/30/2015   Insomnia 12/19/2014   Pedal edema 11/06/2014   Depression 11/06/2014   Genital herpes 11/06/2014   Back pain 11/06/2014    REFERRING DIAG: M25.551 (ICD-10-CM) - Hip  pain, acute, right    THERAPY DIAG:  Pain in right hip  Muscle weakness (generalized)  Other abnormalities of gait and mobility  Rationale for Evaluation and Treatment Rehabilitation  PERTINENT HISTORY: HTN   PRECAUTIONS: None   SUBJECTIVE:  SUBJECTIVE STATEMENT: Patient reports that her pain has decreased overall.  PAIN:  Are you having pain?  Yes: NPRS scale: 5/10 Worst: 8/10 Pain location: R hip Pain description: sharp, tingling Aggravating factors: certain movements Relieving factors: none   OBJECTIVE: (objective measures completed at initial evaluation unless otherwise dated)  DIAGNOSTIC FINDINGS:             See imaging   PATIENT SURVEYS:  FOTO: 51% function; 63% predicted  04/16/22: 56%   COGNITION: Overall cognitive status: Within functional limits for tasks assessed                         SENSATION: Light touch: Impaired - lateral R LE   POSTURE: rounded shoulders, forward head, and medium body habitus   PALPATION: No warmth noted to distal R LE; TTP to R gluteals, R piriformis   LOWER EXTREMITY MMT:   MMT Right eval Left eval  Hip flexion 3+/5 4/5  Hip extension      Hip abduction 3+/5 p! 4/5  Hip adduction 4/5 4/5  Hip internal rotation      Hip external rotation      Knee flexion      Knee extension      Ankle dorsiflexion      Ankle plantarflexion      Ankle inversion      Ankle eversion       (Blank rows = not tested)   LOWER EXTREMITY SPECIAL TESTS:  Hip special tests: Piriformis test: positive    FUNCTIONAL TESTS:  30 Second Sit to Stand: 6 reps   GAIT: Distance walked: 3f Assistive device utilized: None Level of assistance: Complete Independence Comments: antalgic gait on R LE     TREATMENT: [facility] Adult PT Treatment:                                                 DATE: 04/16/22 Therapeutic Exercise: NuStep lvl 6 UE/LE x 5 min while taking subjective LTR x10 SLR x 10, x3 (pain and weakness) Supine marching against GTB 2x1' BIL Bridge 2x10 Supine clamshell 3x10 GTB STS 2x10 - 2in step Therapeutic Activity: Re-administration of FOTO - increased time, patient needed interpreter to help her with questions  [facility]                                                [facility]: [facility] Therapeutic Exercise: NuStep lvl 5 UE/LE x 4 min while taking subjective LTR x 10 Bridge 2x10 Supine clamshell 2x15 GTB STS 2x10 - 4in step Standing hip abd 2x10 YTB Standing mini squats x 15  [facility] Adult PT Treatment:                                                DATE: 04/04/2022 Therapeutic Exercise: LTR x 10  Bridge 2x10 Seated clamshell 2x15 GTB STS 2x10 - 4 in step under feet Standing mini squat x 10  Standing hip abd/ext 2x10   PATIENT EDUCATION:  Education details: eval findings, FOTO, HEP, POC Person educated:  Patient Education method: Explanation, Demonstration, and Handouts Education comprehension: verbalized understanding and returned demonstration   HOME EXERCISE PROGRAM: Access Code: [email or web address] URL: [email or web address] Date: 04/04/2022 Prepared by: Octavio Manns  Exercises - Supine Piriformis Stretch with Leg Straight  - 1-2 x daily - 7 x weekly - 2 sets - 30 sec hold - Clamshell with Resistance  - 1-2 x daily - 7 x weekly - 2 sets - 10 reps - red theraband hold - Supine Lower Trunk Rotation  - 1-2 x daily - 7 x weekly - 2 sets - 10 reps - Standing Hip Abduction with Counter Support  - 1 x daily - 7 x weekly - 2 sets - 10 reps - Standing Hip Extension with Counter Support  - 1 x daily - 7 x weekly - 2 sets - 10 reps   ASSESSMENT:   CLINICAL IMPRESSION: Patient presents to PT with continued reports of R hip pain, but states it is getting better overall. She needed increased time with completing  FOTO today as she required the interpreters assistance to complete. She does show improvement in her FOTO score today at 56%. Session today continued to focus on hip strengthening exercises. Patient was able to tolerate all prescribed exercises with no adverse effects. Patient continues to benefit from skilled PT services and should be progressed as able to improve functional independence.     OBJECTIVE IMPAIRMENTS: decreased activity tolerance, decreased mobility, difficulty walking, decreased ROM, decreased strength, and pain.    ACTIVITY LIMITATIONS: carrying, lifting, standing, squatting, stairs, and bed mobility   PARTICIPATION LIMITATIONS: driving, community activity, occupation, and yard work   PERSONAL FACTORS: 1-2 comorbidities: HTN  are also affecting patient's functional outcome.      GOALS: Goals reviewed with patient? No   SHORT TERM GOALS: Target date: 04/08/2022   Pt will be compliant and knowledgeable with initial HEP for improved comfort and carryover Baseline: initial HEP given  Goal status: MET Pt reports adherence 04/16/22   2.  Pt will self report R hip pain no greater than 5/10 for improved comfort and functional ability Baseline: 8/10 at worst Goal status: INITIAL    LONG TERM GOALS: Target date: 04/29/2022   Pt will self report R hip pain no greater than 2/10 for improved comfort and functional ability Baseline: 8/10 at worst Goal status: INITIAL    2.  Pt will improve FOTO function score to no less than predicted as proxy for functional improvement Baseline: 51% function; 63% predicted  Goal status: INITIAL    3.  Pt will increase 30 Second Sit to Stand rep count to no less than 8 reps for improved balance, strength, and functional mobility Baseline: 6 reps  Goal status: INITIAL    4.  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  Pt will improve R hip flex/abd to no less than 4/5 for improved comfort and functional mobility Baseline: see chart Goal status: INITIAL   PLAN:   PT FREQUENCY: 2x/week   PT DURATION: 6 weeks   PLANNED INTERVENTIONS: Therapeutic exercises, Therapeutic activity, Neuromuscular re-education, Balance training, Gait training, Patient/Family education, Self Care, Joint mobilization, Aquatic Therapy, Dry Needling, Cryotherapy, Moist heat, Manual therapy, and Re-evaluation   PLAN FOR NEXT SESSION: take FOTO, HEP response, proximal hip strengthening    Margarette Canada, PTA 04/16/2022, 12:12 PM

## 2022-04-18 ENCOUNTER — Ambulatory Visit: Payer: Medicare HMO

## 2022-04-18 DIAGNOSIS — R2689 Other abnormalities of gait and mobility: Secondary | ICD-10-CM | POA: Diagnosis not present

## 2022-04-18 DIAGNOSIS — M6281 Muscle weakness (generalized): Secondary | ICD-10-CM

## 2022-04-18 DIAGNOSIS — M25551 Pain in right hip: Secondary | ICD-10-CM

## 2022-04-18 NOTE — Therapy (Signed)
OUTPATIENT PHYSICAL THERAPY TREATMENT NOTE   Patient Name: Christy Price MRN: [id number] DOB:[birth year], 52 y.o., female Today's Date: 04/18/2022  PCP: Vevelyn Francois, NP  REFERRING PROVIDER: Fenton Foy, NP   END OF SESSION:   PT End of Session - 04/18/22 1131     Visit Number 8    Number of Visits 13    Date for PT Re-Evaluation 04/29/22    Authorization Type [employer] MCR and MCD Secondary    PT Start Time 1131    PT Stop Time 1212    PT Time Calculation (min) 41 min    Activity Tolerance Patient tolerated treatment well    Behavior During Therapy WFL for tasks assessed/performed             Past Medical History:  Diagnosis Date   Allergy    seasonal   Anxiety    Arthritis    shoulder,knees,feet   Back spasm    Cancer ([place])     hx cervical cx    Depression    GERD (gastroesophageal reflux disease)    Hypertension    Meningitis    Seizures ([place])    greater than 25 years ago   Past Surgical History:  Procedure Laterality Date   carpal tunnel Left    LAPAROSCOPIC HYSTERECTOMY     2000   SHUNT REMOVAL     2007   SHUNT REPLACEMENT     x 4   Patient Active Problem List   Diagnosis Date Noted   Anxiety and depression 02/27/2022   Language barrier 05/09/2019   Chronic pain of right knee 05/09/2019   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   Peripheral edema 11/04/2018   Essential hypertension 11/04/2018   Rash 11/04/2018   Pain in both lower legs 12/12/2016   Blepharitis of upper and lower eyelids of both eyes 09/24/2016   Keratoconjunctivitis sicca of both eyes not specified as Sjogren's 09/24/2016   Presbyopia of both eyes 09/24/2016   Vitreous floater, bilateral 09/24/2016   Partial nontraumatic tear of right rotator cuff 04/03/2016   Nail fungus 03/30/2015   Toe pain 03/30/2015   Insomnia 12/19/2014   Pedal edema 11/06/2014   Depression 11/06/2014   Genital herpes 11/06/2014   Back pain 11/06/2014    REFERRING DIAG: M25.551 (ICD-10-CM) - Hip  pain, acute, right    THERAPY DIAG:  Pain in right hip  Muscle weakness (generalized)  Other abnormalities of gait and mobility  Rationale for Evaluation and Treatment Rehabilitation  PERTINENT HISTORY: HTN   PRECAUTIONS: None   SUBJECTIVE:  SUBJECTIVE STATEMENT: Pt presents to PT with reports of continued pain. Has been compliant with HEP.  PAIN:  Are you having pain?  Yes: NPRS scale: 5/10 Worst: 8/10 Pain location: R hip Pain description: sharp, tingling Aggravating factors: certain movements Relieving factors: none   OBJECTIVE: (objective measures completed at initial evaluation unless otherwise dated)  DIAGNOSTIC FINDINGS:             See imaging   PATIENT SURVEYS:  FOTO: 51% function; 63% predicted  04/16/22: 56%   COGNITION: Overall cognitive status: Within functional limits for tasks assessed                         SENSATION: Light touch: Impaired - lateral R LE   POSTURE: rounded shoulders, forward head, and medium body habitus   PALPATION: No warmth noted to distal R LE; TTP to R gluteals, R piriformis   LOWER EXTREMITY MMT:   MMT Right eval Left eval  Hip flexion 3+/5 4/5  Hip extension      Hip abduction 3+/5 p! 4/5  Hip adduction 4/5 4/5  Hip internal rotation      Hip external rotation      Knee flexion      Knee extension      Ankle dorsiflexion      Ankle plantarflexion      Ankle inversion      Ankle eversion       (Blank rows = not tested)   LOWER EXTREMITY SPECIAL TESTS:  Hip special tests: Piriformis test: positive    FUNCTIONAL TESTS:  30 Second Sit to Stand: 6 reps   GAIT: Distance walked: 36f Assistive device utilized: None Level of assistance: Complete Independence Comments: antalgic gait on R LE     TREATMENT: [facility] Adult PT Treatment:                                                 DATE: 04/16/22 Therapeutic Exercise: NuStep lvl 6 UE/LE x 5 min while taking subjective LTR x10 SLR x 10 Bridge 2x10 STS 2x10 - 2in step Lateral walk GTB x 2 laps Standing hip abd/exy x 15 each  [facility] Adult PT Treatment:                                                DATE: 04/16/22 Therapeutic Exercise: NuStep lvl 6 UE/LE x 5 min while taking subjective LTR x10 SLR x 10, x3 (pain and weakness) Supine marching against GTB 2x1' BIL Bridge 2x10 Supine clamshell 3x10 GTB STS 2x10 - 2in step Therapeutic Activity: Re-administration of FOTO - increased time, patient needed interpreter to help her with questions  [facility]                                                [facility]: 04[facility]/2022 Therapeutic Exercise: NuStep lvl 5 UE/LE x 4 min while taking subjective LTR x 10 Bridge 2x10 Supine clamshell 2x15 GTB STS 2x10 - 4in step Standing hip abd 2x10 YTB Standing mini squats x 15  [facility]  Adult PT Treatment:                                                DATE: 04/04/2022 Therapeutic Exercise: LTR x 10  Bridge 2x10 Seated clamshell 2x15 GTB STS 2x10 - 4 in step under feet Standing mini squat x 10  Standing hip abd/ext 2x10   PATIENT EDUCATION:  Education details: eval findings, FOTO, HEP, POC Person educated: Patient Education method: Explanation, Demonstration, and Handouts Education comprehension: verbalized understanding and returned demonstration   HOME EXERCISE PROGRAM: Access Code: [email or web address] URL: [email or web address] Date: 04/04/2022 Prepared by: Octavio Manns  Exercises - Supine Piriformis Stretch with Leg Straight  - 1-2 x daily - 7 x weekly - 2 sets - 30 sec hold - Clamshell with Resistance  - 1-2 x daily - 7 x weekly - 2 sets - 10 reps - red theraband hold - Supine Lower Trunk Rotation  - 1-2 x daily - 7 x weekly - 2 sets - 10 reps - Standing Hip Abduction with Counter Support  - 1 x daily - 7 x  weekly - 2 sets - 10 reps - Standing Hip Extension with Counter Support  - 1 x daily - 7 x weekly - 2 sets - 10 reps   ASSESSMENT:   CLINICAL IMPRESSION: Pt was able to complete all prescribed exercises. Was able to progress better today with improved comfort and functional mobility.Therapy focused on improving proximal hip strength. Will continue to progress per POC.        OBJECTIVE IMPAIRMENTS: decreased activity tolerance, decreased mobility, difficulty walking, decreased ROM, decreased strength, and pain.    ACTIVITY LIMITATIONS: carrying, lifting, standing, squatting, stairs, and bed mobility   PARTICIPATION LIMITATIONS: driving, community activity, occupation, and yard work   PERSONAL FACTORS: 1-2 comorbidities: HTN  are also affecting patient's functional outcome.      GOALS: Goals reviewed with patient? No   SHORT TERM GOALS: Target date: 04/08/2022   Pt will be compliant and knowledgeable with initial HEP for improved comfort and carryover Baseline: initial HEP given  Goal status: MET Pt reports adherence 04/16/22   2.  Pt will self report R hip pain no greater than 5/10 for improved comfort and functional ability Baseline: 8/10 at worst Goal status: INITIAL    LONG TERM GOALS: Target date: 04/29/2022   Pt will self report R hip pain no greater than 2/10 for improved comfort and functional ability Baseline: 8/10 at worst Goal status: INITIAL    2.  Pt will improve FOTO function score to no less than predicted as proxy for functional improvement Baseline: 51% function; 63% predicted  Goal status: INITIAL    3.  Pt will increase 30 Second Sit to Stand rep count to no less than 8 reps for improved balance, strength, and functional mobility Baseline: 6 reps  Goal status: INITIAL    4.  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   Pt will improve R hip flex/abd to no less than 4/5 for improved comfort and functional mobility Baseline: see chart Goal status: INITIAL   PLAN:   PT FREQUENCY: 2x/week   PT DURATION: 6 weeks   PLANNED INTERVENTIONS: Therapeutic exercises, Therapeutic activity, Neuromuscular re-education,  Balance training, Gait training, Patient/Family education, Self Care, Joint mobilization, Aquatic Therapy, Dry Needling, Cryotherapy, Moist heat, Manual therapy, and Re-evaluation   PLAN FOR NEXT SESSION: take FOTO, HEP response, proximal hip strengthening    Ward Chatters, PT 04/18/2022, 1:20 PM

## 2022-04-29 NOTE — Therapy (Incomplete)
OUTPATIENT PHYSICAL THERAPY TREATMENT NOTE   Patient Name: Christy Price MRN: [id number] DOB:[birth year], 52 y.o., female Today's Date: 04/29/2022  PCP: Vevelyn Francois, NP  REFERRING PROVIDER: Fenton Foy, NP   END OF SESSION:     Past Medical History:  Diagnosis Date   Allergy    seasonal   Anxiety    Arthritis    shoulder,knees,feet   Back spasm    Cancer ([place])     hx cervical cx    Depression    GERD (gastroesophageal reflux disease)    Hypertension    Meningitis    Seizures ([place])    greater than 25 years ago   Past Surgical History:  Procedure Laterality Date   carpal tunnel Left    LAPAROSCOPIC HYSTERECTOMY     2000   SHUNT REMOVAL     2007   SHUNT REPLACEMENT     x 4   Patient Active Problem List   Diagnosis Date Noted   Anxiety and depression 02/27/2022   Language barrier 05/09/2019   Chronic pain of right knee 05/09/2019   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   Peripheral edema 11/04/2018   Essential hypertension 11/04/2018   Rash 11/04/2018   Pain in both lower legs 12/12/2016   Blepharitis of upper and lower eyelids of both eyes 09/24/2016   Keratoconjunctivitis sicca of both eyes not specified as Sjogren's 09/24/2016   Presbyopia of both eyes 09/24/2016   Vitreous floater, bilateral 09/24/2016   Partial nontraumatic tear of right rotator cuff 04/03/2016   Nail fungus 03/30/2015   Toe pain 03/30/2015   Insomnia 12/19/2014   Pedal edema 11/06/2014   Depression 11/06/2014   Genital herpes 11/06/2014   Back pain 11/06/2014    REFERRING DIAG: M25.551 (ICD-10-CM) - Hip pain, acute, right    THERAPY DIAG:  No diagnosis found.  Rationale for Evaluation and Treatment Rehabilitation  PERTINENT HISTORY: HTN   PRECAUTIONS: None   SUBJECTIVE:                                                                                                                                                                                      SUBJECTIVE  STATEMENT: ***  PAIN:  Are you having pain?  Yes: NPRS scale: 5/10 Worst: 8/10 Pain location: R hip Pain description: sharp, tingling Aggravating factors: certain movements Relieving factors: none   OBJECTIVE: (objective measures completed at initial evaluation unless otherwise dated)  DIAGNOSTIC FINDINGS:             See imaging   PATIENT SURVEYS:  FOTO: 51% function; 63% predicted  04/16/22: 56%   COGNITION: Overall cognitive status: Within functional limits for  tasks assessed                         SENSATION: Light touch: Impaired - lateral R LE   POSTURE: rounded shoulders, forward head, and medium body habitus   PALPATION: No warmth noted to distal R LE; TTP to R gluteals, R piriformis   LOWER EXTREMITY MMT:   MMT Right eval Left eval  Hip flexion 3+/5 4/5  Hip extension      Hip abduction 3+/5 p! 4/5  Hip adduction 4/5 4/5  Hip internal rotation      Hip external rotation      Knee flexion      Knee extension      Ankle dorsiflexion      Ankle plantarflexion      Ankle inversion      Ankle eversion       (Blank rows = not tested)   LOWER EXTREMITY SPECIAL TESTS:  Hip special tests: Piriformis test: positive    FUNCTIONAL TESTS:  30 Second Sit to Stand: 6 reps   GAIT: Distance walked: 61f Assistive device utilized: None Level of assistance: Complete Independence Comments: antalgic gait on R LE     TREATMENT: [facility] Adult PT Treatment:                                                DATE: 04/30/22 Therapeutic Exercise: NuStep lvl 6 UE/LE x 5 min while taking subjective LTR x10 SLR x 10 Bridge 2x10 STS 2x10 - 2in step Lateral walk GTB x 2 laps Standing hip abd/exy x 15 each  [facility] Adult PT Treatment:                                                DATE: 04/16/22 Therapeutic Exercise: NuStep lvl 6 UE/LE x 5 min while taking subjective LTR x10 SLR x 10 Bridge 2x10 STS 2x10 - 2in step Lateral walk GTB x 2 laps Standing hip abd/exy x 15  each  [facility] Adult PT Treatment:                                                DATE: 04/16/22 Therapeutic Exercise: NuStep lvl 6 UE/LE x 5 min while taking subjective LTR x10 SLR x 10, x3 (pain and weakness) Supine marching against GTB 2x1' BIL Bridge 2x10 Supine clamshell 3x10 GTB STS 2x10 - 2in step Therapeutic Activity: Re-administration of FOTO - increased time, patient needed interpreter to help her with questions  [facility]                                                [facility]: 04/[facility]/2022 Therapeutic Exercise: NuStep lvl 5 UE/LE x 4 min while taking subjective LTR x 10 Bridge 2x10 Supine clamshell 2x15 GTB STS 2x10 - 4in step Standing hip abd 2x10 YTB Standing mini squats x 15  [facility] Adult PT Treatment:  DATE: 04/04/2022 Therapeutic Exercise: LTR x 10  Bridge 2x10 Seated clamshell 2x15 GTB STS 2x10 - 4 in step under feet Standing mini squat x 10  Standing hip abd/ext 2x10   PATIENT EDUCATION:  Education details: eval findings, FOTO, HEP, POC Person educated: Patient Education method: Explanation, Demonstration, and Handouts Education comprehension: verbalized understanding and returned demonstration   HOME EXERCISE PROGRAM: Access Code: [email or web address] URL: [email or web address] Date: 04/04/2022 Prepared by: Octavio Manns  Exercises - Supine Piriformis Stretch with Leg Straight  - 1-2 x daily - 7 x weekly - 2 sets - 30 sec hold - Clamshell with Resistance  - 1-2 x daily - 7 x weekly - 2 sets - 10 reps - red theraband hold - Supine Lower Trunk Rotation  - 1-2 x daily - 7 x weekly - 2 sets - 10 reps - Standing Hip Abduction with Counter Support  - 1 x daily - 7 x weekly - 2 sets - 10 reps - Standing Hip Extension with Counter Support  - 1 x daily - 7 x weekly - 2 sets - 10 reps   ASSESSMENT:   CLINICAL IMPRESSION: ***    OBJECTIVE IMPAIRMENTS: decreased activity tolerance, decreased mobility,  difficulty walking, decreased ROM, decreased strength, and pain.    ACTIVITY LIMITATIONS: carrying, lifting, standing, squatting, stairs, and bed mobility   PARTICIPATION LIMITATIONS: driving, community activity, occupation, and yard work   PERSONAL FACTORS: 1-2 comorbidities: HTN  are also affecting patient's functional outcome.      GOALS: Goals reviewed with patient? No   SHORT TERM GOALS: Target date: 04/08/2022   Pt will be compliant and knowledgeable with initial HEP for improved comfort and carryover Baseline: initial HEP given  Goal status: MET Pt reports adherence 04/16/22   2.  Pt will self report R hip pain no greater than 5/10 for improved comfort and functional ability Baseline: 8/10 at worst Goal status: INITIAL    LONG TERM GOALS: Target date: 04/29/2022   Pt will self report R hip pain no greater than 2/10 for improved comfort and functional ability Baseline: 8/10 at worst Goal status: INITIAL    2.  Pt will improve FOTO function score to no less than predicted as proxy for functional improvement Baseline: 51% function; 63% predicted  Goal status: INITIAL    3.  Pt will increase 30 Second Sit to Stand rep count to no less than 8 reps for improved balance, strength, and functional mobility Baseline: 6 reps  Goal status: INITIAL    4.  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  Pt will improve R hip flex/abd to no less than 4/5 for improved comfort and functional mobility Baseline: see chart Goal status: INITIAL   PLAN:   PT FREQUENCY: 2x/week   PT DURATION: 6 weeks   PLANNED INTERVENTIONS: Therapeutic exercises, Therapeutic activity, Neuromuscular re-education, Balance training, Gait training, Patient/Family education, Self Care, Joint mobilization, Aquatic Therapy, Dry Needling, Cryotherapy, Moist heat, Manual  therapy, and Re-evaluation   PLAN FOR NEXT SESSION: take FOTO, HEP response, proximal hip strengthening    Ward Chatters, PT 04/29/2022, 12:28 PM

## 2022-04-30 ENCOUNTER — Ambulatory Visit: Payer: Medicare HMO

## 2022-05-02 ENCOUNTER — Ambulatory Visit: Payer: Medicare HMO

## 2022-05-02 DIAGNOSIS — M25551 Pain in right hip: Secondary | ICD-10-CM

## 2022-05-02 DIAGNOSIS — R2689 Other abnormalities of gait and mobility: Secondary | ICD-10-CM | POA: Diagnosis not present

## 2022-05-02 DIAGNOSIS — M6281 Muscle weakness (generalized): Secondary | ICD-10-CM

## 2022-05-02 NOTE — Therapy (Signed)
OUTPATIENT PHYSICAL THERAPY TREATMENT NOTE   Patient Name: Christy Price MRN: [id number] DOB:Oct 01, 1970, 52 y.o., female Today's Date: 05/02/2022  PCP: Vevelyn Francois, NP  REFERRING PROVIDER: Fenton Foy, NP   END OF SESSION:   PT End of Session - 05/02/22 1531     Visit Number 9    Number of Visits 13    Date for PT Re-Evaluation 05/30/22    Authorization Type [employer] MCR and MCD Secondary    PT Start Time [id number]    PT Stop Time [id number]    PT Time Calculation (min) 43 min    Activity Tolerance Patient tolerated treatment well    Behavior During Therapy WFL for tasks assessed/performed              Past Medical History:  Diagnosis Date   Allergy    seasonal   Anxiety    Arthritis    shoulder,knees,feet   Back spasm    Cancer ([place])     hx cervical cx    Depression    GERD (gastroesophageal reflux disease)    Hypertension    Meningitis    Seizures ([place])    greater than 25 years ago   Past Surgical History:  Procedure Laterality Date   carpal tunnel Left    LAPAROSCOPIC HYSTERECTOMY     2000   SHUNT REMOVAL     2007   SHUNT REPLACEMENT     x 4   Patient Active Problem List   Diagnosis Date Noted   Anxiety and depression 02/27/2022   Language barrier 05/09/2019   Chronic pain of right knee 05/09/2019   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   Peripheral edema 11/04/2018   Essential hypertension 11/04/2018   Rash 11/04/2018   Pain in both lower legs 12/12/2016   Blepharitis of upper and lower eyelids of both eyes 09/24/2016   Keratoconjunctivitis sicca of both eyes not specified as Sjogren's 09/24/2016   Presbyopia of both eyes 09/24/2016   Vitreous floater, bilateral 09/24/2016   Partial nontraumatic tear of right rotator cuff 04/03/2016   Nail fungus 03/30/2015   Toe pain 03/30/2015   Insomnia 12/19/2014   Pedal edema 11/06/2014   Depression 11/06/2014   Genital herpes 11/06/2014   Back pain 11/06/2014    REFERRING DIAG: M25.551 (ICD-10-CM) -  Hip pain, acute, right    THERAPY DIAG:  Pain in right hip - Plan: PT plan of care cert/re-cert  Muscle weakness (generalized) - Plan: PT plan of care cert/re-cert  Other abnormalities of gait and mobility - Plan: PT plan of care cert/re-cert  Rationale for Evaluation and Treatment Rehabilitation  PERTINENT HISTORY: HTN   PRECAUTIONS: None   SUBJECTIVE:  SUBJECTIVE STATEMENT: Pt presents to PT with with reports of continued R hip pain. Has been compliant with HEP.   PAIN:  Are you having pain?  Yes: NPRS scale: 5/10 Worst: 8/10 Pain location: R hip Pain description: sharp, tingling Aggravating factors: certain movements Relieving factors: none   OBJECTIVE: (objective measures completed at initial evaluation unless otherwise dated)  DIAGNOSTIC FINDINGS:             See imaging   PATIENT SURVEYS:  FOTO: 51% function; 63% predicted  04/16/22: 56% 05/02/22: 64%   COGNITION: Overall cognitive status: Within functional limits for tasks assessed                         SENSATION: Light touch: Impaired - lateral R LE   POSTURE: rounded shoulders, forward head, and medium body habitus   PALPATION: No warmth noted to distal R LE; TTP to R gluteals, R piriformis   LOWER EXTREMITY MMT:   MMT Right eval Left eval  Hip flexion 3+/5 4/5  Hip extension      Hip abduction 3+/5 p! 4/5  Hip adduction 4/5 4/5  Hip internal rotation      Hip external rotation      Knee flexion      Knee extension      Ankle dorsiflexion      Ankle plantarflexion      Ankle inversion      Ankle eversion       (Blank rows = not tested)   LOWER EXTREMITY SPECIAL TESTS:  Hip special tests: Piriformis test: positive    FUNCTIONAL TESTS:  30 Second Sit to Stand: 6 reps   GAIT: Distance walked: 46f Assistive  device utilized: None Level of assistance: Complete Independence Comments: antalgic gait on R LE     TREATMENT: [facility] Adult PT Treatment:                                                DATE: 05/02/22 Therapeutic Exercise: NuStep lvl 6 UE/LE x 5 min while taking subjective LTR x10 SLR x 10 Bridge 2x10 STS 2x10 - 2in step Lateral walk GTB x 2 laps Standing hip abd/ext x 15 each  [facility] Adult PT Treatment:                                                DATE: 04/16/22 Therapeutic Exercise: NuStep lvl 6 UE/LE x 5 min while taking subjective LTR x10 SLR x 10 Bridge 2x10 STS 2x10 - 2in step Lateral walk GTB x 2 laps Standing hip abd/exy x 15 each  [facility] Adult PT Treatment:                                                DATE: 04/16/22 Therapeutic Exercise: NuStep lvl 6 UE/LE x 5 min while taking subjective LTR x10 SLR x 10, x3 (pain and weakness) Supine marching against GTB 2x1' BIL Bridge 2x10 Supine clamshell 3x10 GTB STS 2x10 - 2in step Therapeutic Activity: Re-administration of FOTO - increased time, patient needed interpreter to  help her with questions  [facility] Adult PT Treatment:                                                DATE: 04/09/2022 Therapeutic Exercise: NuStep lvl 5 UE/LE x 4 min while taking subjective LTR x 10 Bridge 2x10 Supine clamshell 2x15 GTB STS 2x10 - 4in step Standing hip abd 2x10 YTB Standing mini squats x 15  [facility] Adult PT Treatment:                                                DATE: 04/04/2022 Therapeutic Exercise: LTR x 10  Bridge 2x10 Seated clamshell 2x15 GTB STS 2x10 - 4 in step under feet Standing mini squat x 10  Standing hip abd/ext 2x10   PATIENT EDUCATION:  Education details: continue HEP, updated POC Person educated: Patient Education method: Explanation, Demonstration, and Handouts Education comprehension: verbalized understanding and returned demonstration   HOME EXERCISE PROGRAM: Access Code: [email or web address] URL:  [email or web address] Date: 04/04/2022 Prepared by: Octavio Manns  Exercises - Supine Piriformis Stretch with Leg Straight  - 1-2 x daily - 7 x weekly - 2 sets - 30 sec hold - Clamshell with Resistance  - 1-2 x daily - 7 x weekly - 2 sets - 10 reps - red theraband hold - Supine Lower Trunk Rotation  - 1-2 x daily - 7 x weekly - 2 sets - 10 reps - Standing Hip Abduction with Counter Support  - 1 x daily - 7 x weekly - 2 sets - 10 reps - Standing Hip Extension with Counter Support  - 1 x daily - 7 x weekly - 2 sets - 10 reps   ASSESSMENT:   CLINICAL IMPRESSION: Pt was able to complete all prescribed exercises with no adverse effect. Over the course of PT treatment she has show improving LE strength and functional mobility as well as increase in subjective functional ability assessed via FOTO. She does have lingering R hip pain, would benefit from short continuation of PT services. Will continue to progress as tolerated per POC.     OBJECTIVE IMPAIRMENTS: decreased activity tolerance, decreased mobility, difficulty walking, decreased ROM, decreased strength, and pain.    ACTIVITY LIMITATIONS: carrying, lifting, standing, squatting, stairs, and bed mobility   PARTICIPATION LIMITATIONS: driving, community activity, occupation, and yard work   PERSONAL FACTORS: 1-2 comorbidities: HTN  are also affecting patient's functional outcome.      GOALS: Goals reviewed with patient? No   SHORT TERM GOALS: Target date: 04/08/2022   Pt will be compliant and knowledgeable with initial HEP for improved comfort and carryover Baseline: initial HEP given  Goal status: MET Pt reports adherence 04/16/22   2.  Pt will self report R hip pain no greater than 5/10 for improved comfort and functional ability Baseline: 8/10 at worst Goal status: MET   LONG TERM GOALS: Target date: 05/30/2022    Pt will self report R hip pain no greater than 2/10 for improved comfort and functional  ability Baseline: 8/10 at worst Goal status: ONGOING   2.  Pt will improve FOTO function score to no less than predicted as proxy for functional improvement Baseline: 51% function; 63% predicted  05/02/2022: 64% function Goal  status: MET   3.  Pt will increase 30 Second Sit to Stand rep count to no less than 8 reps for improved balance, strength, and functional mobility Baseline: 6 reps  05/02/2022: 6 reps - no pain Goal status: ONGOING   4.  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  Pt will improve R hip flex/abd to no less than 4/5 for improved comfort and functional mobility Baseline: see chart Goal status: ONGOING   PLAN:   PT FREQUENCY: 1x/week   PT DURATION: 4 weeks   PLANNED INTERVENTIONS: Therapeutic exercises, Therapeutic activity, Neuromuscular re-education, Balance training, Gait training, Patient/Family education, Self Care, Joint mobilization, Aquatic Therapy, Dry Needling, Cryotherapy, Moist heat, Manual therapy, and Re-evaluation   PLAN FOR NEXT SESSION: proximal hip strengthening   Referring diagnosis? M25.551 (ICD-10-CM) - Hip pain, acute, right  Treatment diagnosis? (if different than referring diagnosis)  Pain in right hip Muscle weakness (generalized) Other abnormalities of gait and mobility What was this (referring dx) caused by? []$  Surgery []$  Fall [x]$  Ongoing issue []$  Arthritis []$  Other: ____________   Laterality: [x]$  Rt []$  Lt []$  Both   Check all possible CPT codes:             *CHOOSE 10 OR LESS*                          [x]$  97110 (Therapeutic Exercise)             []$  92507 (SLP Treatment)  [x]$  97112 (Neuro Re-ed)                           []$  92526 (Swallowing Treatment)             [x]$  97116 (Gait Training)                           []$  [id number] (Cognitive Training, 1st 15 minutes) [x]$  97140 (Manual  Therapy)                                []$  97130 (Cognitive Training, each add'l 15 minutes)   [x]$  97164 (Re-evaluation)                              []$  Other, List CPT Code ____________  [x]$  [id number] (Therapeutic Activities)                                    [x]$  97535 (Self Care)                      []$  All codes above (97110 - 97535)            []$  24401 (Mechanical Traction)            []$  97014 (E-stim Unattended)            []$  97032 (E-stim manual)            []$  02725 (Ionto)            []$  97035 (Ultrasound) []$   [id number] (Physical Performance Training) []$  [id number] (Aquatic Therapy) []$  97016 (Vasopneumatic Device) []$  [id number] (Paraffin) []$  97034 (Contrast Bath) []$  [phone] (Wound Care 1st 20 sq cm) []$  97598 (Wound Care each add'l 20 sq cm) []$  97760 (Orthotic Fabrication, Fitting, Training Initial) []$  [id number] (Prosthetic Management and Training Initial) []$  42595 (Orthotic or Prosthetic Training/ Modification Subsequent)   Ward Chatters, PT 05/02/2022, 7:18 PM

## 2022-05-10 ENCOUNTER — Other Ambulatory Visit: Payer: Self-pay | Admitting: Nurse Practitioner

## 2022-05-10 DIAGNOSIS — G8929 Other chronic pain: Secondary | ICD-10-CM

## 2022-05-10 DIAGNOSIS — M25551 Pain in right hip: Secondary | ICD-10-CM

## 2022-05-10 DIAGNOSIS — M79661 Pain in right lower leg: Secondary | ICD-10-CM

## 2022-05-15 NOTE — Therapy (Signed)
OUTPATIENT PHYSICAL THERAPY TREATMENT NOTE   Patient Name: Christy Price MRN: [id number] DOB:1970/07/08, 52 y.o., female Today's Date: 05/16/2022  PCP: Vevelyn Francois, NP  REFERRING PROVIDER: Fenton Foy, NP   END OF SESSION:   PT End of Session - 05/16/22 1444     Visit Number 10    Number of Visits 13    Date for PT Re-Evaluation 05/30/22    Authorization Type [employer] MCR and MCD Secondary    PT Start Time [id number]    PT Stop Time 1525    PT Time Calculation (min) 40 min    Activity Tolerance Patient tolerated treatment well    Behavior During Therapy WFL for tasks assessed/performed               Past Medical History:  Diagnosis Date   Allergy    seasonal   Anxiety    Arthritis    shoulder,knees,feet   Back spasm    Cancer ([place])     hx cervical cx    Depression    GERD (gastroesophageal reflux disease)    Hypertension    Meningitis    Seizures ([place])    greater than 25 years ago   Past Surgical History:  Procedure Laterality Date   carpal tunnel Left    LAPAROSCOPIC HYSTERECTOMY     2000   SHUNT REMOVAL     2007   SHUNT REPLACEMENT     x 4   Patient Active Problem List   Diagnosis Date Noted   Anxiety and depression 02/27/2022   Language barrier 05/09/2019   Chronic pain of right knee 05/09/2019   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   Peripheral edema 11/04/2018   Essential hypertension 11/04/2018   Rash 11/04/2018   Pain in both lower legs 12/12/2016   Blepharitis of upper and lower eyelids of both eyes 09/24/2016   Keratoconjunctivitis sicca of both eyes not specified as Sjogren's 09/24/2016   Presbyopia of both eyes 09/24/2016   Vitreous floater, bilateral 09/24/2016   Partial nontraumatic tear of right rotator cuff 04/03/2016   Nail fungus 03/30/2015   Toe pain 03/30/2015   Insomnia 12/19/2014   Pedal edema 11/06/2014   Depression 11/06/2014   Genital herpes 11/06/2014   Back pain 11/06/2014    REFERRING DIAG: M25.551 (ICD-10-CM)  - Hip pain, acute, right    THERAPY DIAG:  Pain in right hip  Muscle weakness (generalized)  Other abnormalities of gait and mobility  Rationale for Evaluation and Treatment Rehabilitation  PERTINENT HISTORY: HTN   PRECAUTIONS: None   SUBJECTIVE:  SUBJECTIVE STATEMENT: Pt presents to PT with continued reports of severe R hip pain. Has a first visit appointment with Dr. Sharol Given and orthopedics next week on 05/21/2022. Ready to begin PT at this time.   PAIN:  Are you having pain?  Yes: NPRS scale: 5/10 Worst: 8/10 Pain location: R hip Pain description: sharp, tingling Aggravating factors: certain movements Relieving factors: none   OBJECTIVE: (objective measures completed at initial evaluation unless otherwise dated)  DIAGNOSTIC FINDINGS:             See imaging   PATIENT SURVEYS:  FOTO: 51% function; 63% predicted  04/16/22: 56% 05/02/22: 64%   COGNITION: Overall cognitive status: Within functional limits for tasks assessed                         SENSATION: Light touch: Impaired - lateral R LE   POSTURE: rounded shoulders, forward head, and medium body habitus   PALPATION: No warmth noted to distal R LE; TTP to R gluteals, R piriformis   LOWER EXTREMITY MMT:   MMT Right eval Left eval  Hip flexion 3+/5 4/5  Hip extension      Hip abduction 3+/5 p! 4/5  Hip adduction 4/5 4/5  Hip internal rotation      Hip external rotation      Knee flexion      Knee extension      Ankle dorsiflexion      Ankle plantarflexion      Ankle inversion      Ankle eversion       (Blank rows = not tested)   LOWER EXTREMITY SPECIAL TESTS:  Hip special tests: Piriformis test: positive    FUNCTIONAL TESTS:  30 Second Sit to Stand: 6 reps   GAIT: Distance walked: 70f Assistive device utilized:  None Level of assistance: Complete Independence Comments: antalgic gait on R LE     TREATMENT: [facility] Adult PT Treatment:                                                DATE: 05/16/22 Therapeutic Exercise: NuStep lvl 6 LE only x 3 min while taking subjective STS x 10 LTR x10 S/L clamshell 2x10 R - painful Seated clamshell 2x10 GTB Seated pilates ring squeeze x 10 - pain Standing hip abd 2x10 2.5# Step ups 2x10 8in R leading  [facility] Adult PT Treatment:                                                DATE: 05/02/22 Therapeutic Exercise: NuStep lvl 6 UE/LE x 5 min while taking subjective LTR x10 SLR x 10 Bridge 2x10 STS 2x10 - 2in step Lateral walk GTB x 2 laps Standing hip abd/ext x 15 each  [facility] Adult PT Treatment:                                                DATE: 04/16/22 Therapeutic Exercise: NuStep lvl 6 UE/LE x 5 min while taking subjective LTR x10 SLR x 10 Bridge 2x10 STS 2x10 - 2in  step Lateral walk GTB x 2 laps Standing hip abd/exy x 15 each  [facility] Adult PT Treatment:                                                DATE: 04/16/22 Therapeutic Exercise: NuStep lvl 6 UE/LE x 5 min while taking subjective LTR x10 SLR x 10, x3 (pain and weakness) Supine marching against GTB 2x1' BIL Bridge 2x10 Supine clamshell 3x10 GTB STS 2x10 - 2in step Therapeutic Activity: Re-administration of FOTO - increased time, patient needed interpreter to help her with questions  [facility] Adult PT Treatment:                                                DATE: 04/09/2022 Therapeutic Exercise: NuStep lvl 5 UE/LE x 4 min while taking subjective LTR x 10 Bridge 2x10 Supine clamshell 2x15 GTB STS 2x10 - 4in step Standing hip abd 2x10 YTB Standing mini squats x 15  [facility] Adult PT Treatment:                                                DATE: 04/04/2022 Therapeutic Exercise: LTR x 10  Bridge 2x10 Seated clamshell 2x15 GTB STS 2x10 - 4 in step under feet Standing mini squat x 10  Standing hip  abd/ext 2x10   PATIENT EDUCATION:  Education details: continue HEP, updated POC Person educated: Patient Education method: Explanation, Demonstration, and Handouts Education comprehension: verbalized understanding and returned demonstration   HOME EXERCISE PROGRAM: Access Code: [email or web address] URL: [email or web address] Date: 04/04/2022 Prepared by: Octavio Manns  Exercises - Supine Piriformis Stretch with Leg Straight  - 1-2 x daily - 7 x weekly - 2 sets - 30 sec hold - Clamshell with Resistance  - 1-2 x daily - 7 x weekly - 2 sets - 10 reps - red theraband hold - Supine Lower Trunk Rotation  - 1-2 x daily - 7 x weekly - 2 sets - 10 reps - Standing Hip Abduction with Counter Support  - 1 x daily - 7 x weekly - 2 sets - 10 reps - Standing Hip Extension with Counter Support  - 1 x daily - 7 x weekly - 2 sets - 10 reps   ASSESSMENT:   CLINICAL IMPRESSION: Pt tolerated treatment fair but was limited by pain. Therapy focused on proximal hip strengthening and improving muscle length/functional mobility. Pt has visit with orthopedics next week, will change POC according to recommendations.     OBJECTIVE IMPAIRMENTS: decreased activity tolerance, decreased mobility, difficulty walking, decreased ROM, decreased strength, and pain.    ACTIVITY LIMITATIONS: carrying, lifting, standing, squatting, stairs, and bed mobility   PARTICIPATION LIMITATIONS: driving, community activity, occupation, and yard work   PERSONAL FACTORS: 1-2 comorbidities: HTN  are also affecting patient's functional outcome.      GOALS: Goals reviewed with patient? No   SHORT TERM GOALS: Target date: 04/08/2022   Pt will be compliant and knowledgeable with initial HEP for improved comfort and carryover Baseline: initial HEP given  Goal status: MET Pt reports adherence 04/16/22   2.  Pt  will self report R hip pain no greater than 5/10 for improved comfort and functional ability Baseline: 8/10 at worst Goal  status: MET   LONG TERM GOALS: Target date: 05/30/2022    Pt will self report R hip pain no greater than 2/10 for improved comfort and functional ability Baseline: 8/10 at worst Goal status: ONGOING   2.  Pt will improve FOTO function score to no less than predicted as proxy for functional improvement Baseline: 51% function; 63% predicted  05/02/2022: 64% function Goal status: MET   3.  Pt will increase 30 Second Sit to Stand rep count to no less than 8 reps for improved balance, strength, and functional mobility Baseline: 6 reps  05/02/2022: 6 reps - no pain Goal status: ONGOING   4.  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  Pt will improve R hip flex/abd to no less than 4/5 for improved comfort and functional mobility Baseline: see chart Goal status: ONGOING   PLAN:   PT FREQUENCY: 1x/week   PT DURATION: 4 weeks   PLANNED INTERVENTIONS: Therapeutic exercises, Therapeutic activity, Neuromuscular re-education, Balance training, Gait training, Patient/Family education, Self Care, Joint mobilization, Aquatic Therapy, Dry Needling, Cryotherapy, Moist heat, Manual therapy, and Re-evaluation   PLAN FOR NEXT SESSION: proximal hip strengthening   Referring diagnosis? M25.551 (ICD-10-CM) - Hip pain, acute, right  Treatment diagnosis? (if different than referring diagnosis)  Pain in right hip Muscle weakness (generalized) Other abnormalities of gait and mobility What was this (referring dx) caused by? '[]'$  Surgery '[]'$  Fall '[x]'$  Ongoing issue '[]'$  Arthritis '[]'$  Other: ____________   Laterality: '[x]'$  Rt '[]'$  Lt '[]'$  Both   Check all possible CPT codes:             *CHOOSE 10 OR LESS*                          '[x]'$  97110 (Therapeutic Exercise)             '[]'$  92507 (SLP Treatment)  '[x]'$  97112 (Neuro Re-ed)                           '[]'$  92526 (Swallowing  Treatment)             '[x]'$  97116 (Gait Training)                           '[]'$  [id number] (Cognitive Training, 1st 15 minutes) '[x]'$  97140 (Manual Therapy)                                '[]'$  97130 (Cognitive Training, each add'l 15 minutes)   '[x]'$  97164 (Re-evaluation)                              '[]'$  Other, List CPT Code ____________  '[x]'$  [id number] (Therapeutic Activities)                                    '[x]'$  97535 (Self Care)                      '[]'$   All codes above (97110 - 97535)            '[]'$  97012 (Mechanical Traction)            '[]'$  97014 (E-stim Unattended)            '[]'$  97032 (E-stim manual)            '[]'$  97033 (Ionto)            '[]'$  97035 (Ultrasound) '[]'$  97750 (Physical Performance Training) '[]'$  [id number] (Aquatic Therapy) '[]'$  97016 (Vasopneumatic Device) '[]'$  [id number] (Paraffin) '[]'$  97034 (Contrast Bath) '[]'$  97597 (Wound Care 1st 20 sq cm) '[]'$  97598 (Wound Care each add'l 20 sq cm) '[]'$  97760 (Orthotic Fabrication, Fitting, Training Initial) '[]'$  [id number] (Prosthetic Management and Training Initial) '[]'$  [id number] (Orthotic or Prosthetic Training/ Modification Subsequent)   Ward Chatters, PT 05/16/2022, 6:44 PM

## 2022-05-16 ENCOUNTER — Ambulatory Visit: Payer: Medicare HMO | Attending: Nurse Practitioner

## 2022-05-16 DIAGNOSIS — R2689 Other abnormalities of gait and mobility: Secondary | ICD-10-CM

## 2022-05-16 DIAGNOSIS — M25551 Pain in right hip: Secondary | ICD-10-CM | POA: Diagnosis not present

## 2022-05-16 DIAGNOSIS — M6281 Muscle weakness (generalized): Secondary | ICD-10-CM

## 2022-05-21 ENCOUNTER — Ambulatory Visit (INDEPENDENT_AMBULATORY_CARE_PROVIDER_SITE_OTHER): Payer: Medicare HMO | Admitting: Orthopedic Surgery

## 2022-05-21 ENCOUNTER — Ambulatory Visit (INDEPENDENT_AMBULATORY_CARE_PROVIDER_SITE_OTHER): Payer: Medicare HMO

## 2022-05-21 DIAGNOSIS — M79604 Pain in right leg: Secondary | ICD-10-CM

## 2022-05-21 DIAGNOSIS — M5410 Radiculopathy, site unspecified: Secondary | ICD-10-CM | POA: Diagnosis not present

## 2022-05-21 MED ORDER — PREDNISONE 10 MG PO TABS: 10.0000 mg | ORAL_TABLET | Freq: Every day | ORAL | 0 refills | Status: DC

## 2022-05-23 ENCOUNTER — Ambulatory Visit: Payer: Medicare HMO

## 2022-05-23 DIAGNOSIS — R2689 Other abnormalities of gait and mobility: Secondary | ICD-10-CM | POA: Diagnosis not present

## 2022-05-23 DIAGNOSIS — M25551 Pain in right hip: Secondary | ICD-10-CM

## 2022-05-23 DIAGNOSIS — M6281 Muscle weakness (generalized): Secondary | ICD-10-CM

## 2022-05-23 NOTE — Therapy (Signed)
OUTPATIENT PHYSICAL THERAPY TREATMENT NOTE   Patient Name: Christy Price MRN: [id number] DOB:November 19, 1970, 52 y.o., female Today's Date: 05/23/2022  PCP: Vevelyn Francois, NP  REFERRING PROVIDER: Fenton Foy, NP   END OF SESSION:   PT End of Session - 05/23/22 1437     Visit Number 11    Number of Visits 13    Date for PT Re-Evaluation 05/30/22    Authorization Type [employer] MCR and MCD Secondary    PT Start Time [id number]    PT Stop Time 1525    PT Time Calculation (min) 40 min    Activity Tolerance Patient tolerated treatment well    Behavior During Therapy WFL for tasks assessed/performed                Past Medical History:  Diagnosis Date   Allergy    seasonal   Anxiety    Arthritis    shoulder,knees,feet   Back spasm    Cancer ([place])     hx cervical cx    Depression    GERD (gastroesophageal reflux disease)    Hypertension    Meningitis    Seizures ([place])    greater than 25 years ago   Past Surgical History:  Procedure Laterality Date   carpal tunnel Left    LAPAROSCOPIC HYSTERECTOMY     2000   SHUNT REMOVAL     2007   SHUNT REPLACEMENT     x 4   Patient Active Problem List   Diagnosis Date Noted   Anxiety and depression 02/27/2022   Language barrier 05/09/2019   Chronic pain of right knee 05/09/2019   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   Peripheral edema 11/04/2018   Essential hypertension 11/04/2018   Rash 11/04/2018   Pain in both lower legs 12/12/2016   Blepharitis of upper and lower eyelids of both eyes 09/24/2016   Keratoconjunctivitis sicca of both eyes not specified as Sjogren's 09/24/2016   Presbyopia of both eyes 09/24/2016   Vitreous floater, bilateral 09/24/2016   Partial nontraumatic tear of right rotator cuff 04/03/2016   Nail fungus 03/30/2015   Toe pain 03/30/2015   Insomnia 12/19/2014   Pedal edema 11/06/2014   Depression 11/06/2014   Genital herpes 11/06/2014   Back pain 11/06/2014    REFERRING DIAG: M25.551  (ICD-10-CM) - Hip pain, acute, right    THERAPY DIAG:  Pain in right hip  Muscle weakness (generalized)  Other abnormalities of gait and mobility  Rationale for Evaluation and Treatment Rehabilitation  PERTINENT HISTORY: HTN   PRECAUTIONS: None   SUBJECTIVE:  SUBJECTIVE STATEMENT: Pt presents to PT with continued pain. Orthopedics believes pain is coming from back.  PAIN:  Are you having pain?  Yes: NPRS scale: 5/10 Worst: 8/10 Pain location: R hip Pain description: sharp, tingling Aggravating factors: certain movements Relieving factors: none   OBJECTIVE: (objective measures completed at initial evaluation unless otherwise dated)  DIAGNOSTIC FINDINGS:             See imaging   PATIENT SURVEYS:  FOTO: 51% function; 63% predicted  04/16/22: 56% 05/02/22: 64%   COGNITION: Overall cognitive status: Within functional limits for tasks assessed                         SENSATION: Light touch: Impaired - lateral R LE   POSTURE: rounded shoulders, forward head, and medium body habitus   PALPATION: No warmth noted to distal R LE; TTP to R gluteals, R piriformis   LOWER EXTREMITY MMT:   MMT Right eval Left eval  Hip flexion 3+/5 4/5  Hip extension      Hip abduction 3+/5 p! 4/5  Hip adduction 4/5 4/5  Hip internal rotation      Hip external rotation      Knee flexion      Knee extension      Ankle dorsiflexion      Ankle plantarflexion      Ankle inversion      Ankle eversion       (Blank rows = not tested)   LOWER EXTREMITY SPECIAL TESTS:  Hip special tests: Piriformis test: positive    FUNCTIONAL TESTS:  30 Second Sit to Stand: 6 reps   GAIT: Distance walked: 87f Assistive device utilized: None Level of assistance: Complete Independence Comments: antalgic gait on R LE      TREATMENT: [facility] Adult PT Treatment:                                                DATE: 05/23/22 Therapeutic Exercise: NuStep lvl 5 UE/LE x 5 min while taking subjective STS x 10 LTR x10 Bridge with ball 2x10 Supine clamshell 2x15 GTB Supine SLR x 10 each STS x 15  [facility] Adult PT Treatment:                                                DATE: 05/16/22 Therapeutic Exercise: NuStep lvl 6 LE only x 3 min while taking subjective STS x 10 LTR x10 S/L clamshell 2x10 R - painful Seated clamshell 2x10 GTB Seated pilates ring squeeze x 10 - pain Standing hip abd 2x10 2.5# Step ups 2x10 8in R leading  [facility] Adult PT Treatment:                                                DATE: 05/02/22 Therapeutic Exercise: NuStep lvl 6 UE/LE x 5 min while taking subjective LTR x10 SLR x 10 Bridge 2x10 STS 2x10 - 2in step Lateral walk GTB x 2 laps Standing hip abd/ext x 15 each  [facility] Adult PT Treatment:  DATE: 04/16/22 Therapeutic Exercise: NuStep lvl 6 UE/LE x 5 min while taking subjective LTR x10 SLR x 10 Bridge 2x10 STS 2x10 - 2in step Lateral walk GTB x 2 laps Standing hip abd/exy x 15 each  [facility] Adult PT Treatment:                                                DATE: 04/16/22 Therapeutic Exercise: NuStep lvl 6 UE/LE x 5 min while taking subjective LTR x10 SLR x 10, x3 (pain and weakness) Supine marching against GTB 2x1' BIL Bridge 2x10 Supine clamshell 3x10 GTB STS 2x10 - 2in step Therapeutic Activity: Re-administration of FOTO - increased time, patient needed interpreter to help her with questions  [facility] Adult PT Treatment:                                                DATE: 04/09/2022 Therapeutic Exercise: NuStep lvl 5 UE/LE x 4 min while taking subjective LTR x 10 Bridge 2x10 Supine clamshell 2x15 GTB STS 2x10 - 4in step Standing hip abd 2x10 YTB Standing mini squats x 15  [facility] Adult PT Treatment:                                                 DATE: 04/04/2022 Therapeutic Exercise: LTR x 10  Bridge 2x10 Seated clamshell 2x15 GTB STS 2x10 - 4 in step under feet Standing mini squat x 10  Standing hip abd/ext 2x10   PATIENT EDUCATION:  Education details: continue HEP, updated POC Person educated: Patient Education method: Explanation, Demonstration, and Handouts Education comprehension: verbalized understanding and returned demonstration   HOME EXERCISE PROGRAM: Access Code: [email or web address] URL: [email or web address] Date: 04/04/2022 Prepared by: Octavio Manns  Exercises - Supine Piriformis Stretch with Leg Straight  - 1-2 x daily - 7 x weekly - 2 sets - 30 sec hold - Clamshell with Resistance  - 1-2 x daily - 7 x weekly - 2 sets - 10 reps - red theraband hold - Supine Lower Trunk Rotation  - 1-2 x daily - 7 x weekly - 2 sets - 10 reps - Standing Hip Abduction with Counter Support  - 1 x daily - 7 x weekly - 2 sets - 10 reps - Standing Hip Extension with Counter Support  - 1 x daily - 7 x weekly - 2 sets - 10 reps   ASSESSMENT:   CLINICAL IMPRESSION: Pt tolerated treatment fair but was limited by pain. Therapy focused on proximal hip strengthening and improving muscle length/functional mobility. Will assess goals next visit and progress POC appropriately.   OBJECTIVE IMPAIRMENTS: decreased activity tolerance, decreased mobility, difficulty walking, decreased ROM, decreased strength, and pain.    ACTIVITY LIMITATIONS: carrying, lifting, standing, squatting, stairs, and bed mobility   PARTICIPATION LIMITATIONS: driving, community activity, occupation, and yard work   PERSONAL FACTORS: 1-2 comorbidities: HTN  are also affecting patient's functional outcome.      GOALS: Goals reviewed with patient? No   SHORT TERM GOALS: Target date: 04/08/2022   Pt will be compliant and knowledgeable with initial HEP for improved comfort  and carryover Baseline: initial HEP given  Goal status: MET Pt reports  adherence 04/16/22   2.  Pt will self report R hip pain no greater than 5/10 for improved comfort and functional ability Baseline: 8/10 at worst Goal status: MET   LONG TERM GOALS: Target date: 05/30/2022    Pt will self report R hip pain no greater than 2/10 for improved comfort and functional ability Baseline: 8/10 at worst Goal status: ONGOING   2.  Pt will improve FOTO function score to no less than predicted as proxy for functional improvement Baseline: 51% function; 63% predicted  05/02/2022: 64% function Goal status: MET   3.  Pt will increase 30 Second Sit to Stand rep count to no less than 8 reps for improved balance, strength, and functional mobility Baseline: 6 reps  05/02/2022: 6 reps - no pain Goal status: ONGOING   4.  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  Pt will improve R hip flex/abd to no less than 4/5 for improved comfort and functional mobility Baseline: see chart Goal status: ONGOING   PLAN:   PT FREQUENCY: 1x/week   PT DURATION: 4 weeks   PLANNED INTERVENTIONS: Therapeutic exercises, Therapeutic activity, Neuromuscular re-education, Balance training, Gait training, Patient/Family education, Self Care, Joint mobilization, Aquatic Therapy, Dry Needling, Cryotherapy, Moist heat, Manual therapy, and Re-evaluation   PLAN FOR NEXT SESSION: proximal hip strengthening   Referring diagnosis? M25.551 (ICD-10-CM) - Hip pain, acute, right  Treatment diagnosis? (if different than referring diagnosis)  Pain in right hip Muscle weakness (generalized) Other abnormalities of gait and mobility What was this (referring dx) caused by? '[]'$  Surgery '[]'$  Fall '[x]'$  Ongoing issue '[]'$  Arthritis '[]'$  Other: ____________   Laterality: '[x]'$  Rt '[]'$  Lt '[]'$  Both   Check all possible CPT codes:             *CHOOSE 10 OR LESS*                           '[x]'$  97110 (Therapeutic Exercise)             '[]'$  92507 (SLP Treatment)  '[x]'$  97112 (Neuro Re-ed)                           '[]'$  92526 (Swallowing Treatment)             '[x]'$  97116 (Gait Training)                           '[]'$  [id number] (Cognitive Training, 1st 15 minutes) '[x]'$  97140 (Manual Therapy)                                '[]'$  97130 (Cognitive Training, each add'l 15 minutes)   '[x]'$  91478 (Re-evaluation)                              '[]'$  Other, List CPT Code ____________  '[x]'$  [id number] (Therapeutic Activities)                                    [  x] [id number] (Self Care)                      '[]'$  All codes above (97110 - 97535)            '[]'$  97012 (Mechanical Traction)            '[]'$  97014 (E-stim Unattended)            '[]'$  97032 (E-stim manual)            '[]'$  97033 (Ionto)            '[]'$  97035 (Ultrasound) '[]'$  97750 (Physical Performance Training) '[]'$  [id number] (Aquatic Therapy) '[]'$  97016 (Vasopneumatic Device) '[]'$  [id number] (Paraffin) '[]'$  97034 (Contrast Bath) '[]'$  97597 (Wound Care 1st 20 sq cm) '[]'$  97598 (Wound Care each add'l 20 sq cm) '[]'$  97760 (Orthotic Fabrication, Fitting, Training Initial) '[]'$  [id number] (Prosthetic Management and Training Initial) '[]'$  [phone] (Orthotic or Prosthetic Training/ Modification Subsequent)   Ward Chatters, PT 05/23/2022, 3:34 PM

## 2022-05-29 ENCOUNTER — Ambulatory Visit (INDEPENDENT_AMBULATORY_CARE_PROVIDER_SITE_OTHER): Payer: Medicare HMO | Admitting: Nurse Practitioner

## 2022-05-29 ENCOUNTER — Encounter: Payer: Self-pay | Admitting: Nurse Practitioner

## 2022-05-29 VITALS — BP 142/77 | HR 70 | Temp 97.4°F | Ht <= 58 in | Wt 155.8 lb

## 2022-05-29 DIAGNOSIS — M25551 Pain in right hip: Secondary | ICD-10-CM | POA: Diagnosis not present

## 2022-05-29 DIAGNOSIS — M79661 Pain in right lower leg: Secondary | ICD-10-CM | POA: Diagnosis not present

## 2022-05-29 DIAGNOSIS — M79662 Pain in left lower leg: Secondary | ICD-10-CM | POA: Diagnosis not present

## 2022-05-29 MED ORDER — DICLOFENAC SODIUM 1 % EX GEL: 2.0000 g | Freq: Four times a day (QID) | CUTANEOUS | 2 refills | Status: DC

## 2022-05-29 MED ORDER — DICLOFENAC SODIUM 75 MG PO TBEC: 75.0000 mg | DELAYED_RELEASE_TABLET | Freq: Two times a day (BID) | ORAL | 0 refills | Status: DC

## 2022-05-29 NOTE — Assessment & Plan Note (Signed)
-   Ambulatory referral to Orthopedic Surgery  2. Hip pain, acute, right  - Ambulatory referral to Orthopedic Surgery - diclofenac (VOLTAREN) 75 MG EC tablet; Take 1 tablet (75 mg total) by mouth 2 (two) times daily.  Dispense: 30 tablet; Refill: 0 - diclofenac Sodium (VOLTAREN ARTHRITIS PAIN) 1 % GEL; Apply 2 g topically 4 (four) times daily.  Dispense: 50 g; Refill: 2   Follow up:  Follow up in 3 months

## 2022-05-29 NOTE — Patient Instructions (Signed)
1. Pain in both lower legs  - Ambulatory referral to Orthopedic Surgery  2. Hip pain, acute, right  - Ambulatory referral to Orthopedic Surgery - diclofenac (VOLTAREN) 75 MG EC tablet; Take 1 tablet (75 mg total) by mouth 2 (two) times daily.  Dispense: 30 tablet; Refill: 0 - diclofenac Sodium (VOLTAREN ARTHRITIS PAIN) 1 % GEL; Apply 2 g topically 4 (four) times daily.  Dispense: 50 g; Refill: 2   Follow up:  Follow up in 3 months

## 2022-05-29 NOTE — Progress Notes (Signed)
@Patient  ID: Christy Price, female    DOB: 1970/09/13, 52 y.o.   MRN: [id number]  Chief Complaint  Patient presents with   Hip Pain    Pt is complaining of hip pain when she walks pt is doing therapy for couple of months with no improvement.    Referring provider: Vevelyn Francois, NP   HPI  Patient presents today for follow-up on hip pain.  She has been to physical therapy and does have an appointment with OB/GYN tomorrow.  She states that this is not working.  We have referred her to Ortho and she did have imaging.  She was given prednisone but states that this did not help.  She does not want to go back to that particular orthopedic doctor.  We will place referral for second opinion with a different orthopedic specialist. Denies f/c/s, n/v/d, hemoptysis, PND, leg swelling Denies chest pain or edema     Allergies  Allergen Reactions   Epinephrine Other (See Comments)   Oxycodone    Rifampin     Immunization History  Administered Date(s) Administered   Influenza,inj,Quad PF,6+ Mos 11/04/2018, 12/28/2019   PFIZER(Purple Top)SARS-COV-2 Vaccination 06/10/2019, 07/06/2019, 04/07/2020   Tdap 04/09/2017    Past Medical History:  Diagnosis Date   Allergy    seasonal   Anxiety    Arthritis    shoulder,knees,feet   Back spasm    Cancer ([facility])     hx cervical cx    Depression    GERD (gastroesophageal reflux disease)    Hypertension    Meningitis    Seizures ([place])    greater than 25 years ago    Tobacco History: Social History   Tobacco Use  Smoking Status Every Day   Packs/day: 1.5   Types: Cigarettes   Passive exposure: Never  Smokeless Tobacco Never  Tobacco Comments   1 pack a week.   Ready to quit: Not Answered Counseling given: Not Answered Tobacco comments: 1 pack a week.   Outpatient Encounter Medications as of 05/29/2022  Medication Sig   Cholecalciferol (D3 PO) Take by mouth.   diclofenac (VOLTAREN) 75 MG EC tablet Take 1 tablet (75 mg  total) by mouth 2 (two) times daily.   diclofenac Sodium (VOLTAREN ARTHRITIS PAIN) 1 % GEL Apply 2 g topically 4 (four) times daily.   furosemide (LASIX) 20 MG tablet Take 1 tablet (20 mg total) by mouth daily.   gabapentin (NEURONTIN) 300 MG capsule Take 300 mg by mouth as needed.   hydrochlorothiazide (HYDRODIURIL) 25 MG tablet Take 1 tablet (25 mg total) by mouth daily.   lisinopril (ZESTRIL) 10 MG tablet Take 1 tablet (10 mg total) by mouth daily.   predniSONE (DELTASONE) 10 MG tablet Take 1 tablet (10 mg total) by mouth daily with breakfast.   zolpidem (AMBIEN) 10 MG tablet Take 10 mg by mouth at bedtime as needed for sleep.   [DISCONTINUED] meloxicam (MOBIC) 7.5 MG tablet Take 1 tablet (7.5 mg total) by mouth daily.   Blood Pressure Monitor MISC Use as directed daily (Patient not taking: Reported on 12/06/2021)   cyclobenzaprine (FLEXERIL) 10 MG tablet Take 1 tablet (10 mg total) by mouth 3 (three) times daily as needed for muscle spasms. (Patient not taking: Reported on 12/06/2021)   gabapentin (NEURONTIN) 300 MG capsule Take 1 capsule (300 mg total) by mouth 2 (two) times daily for 10 days.   hydrOXYzine (ATARAX/VISTARIL) 10 MG tablet Take 1 tablet (10 mg total) by mouth 3 (three)  times daily as needed. (Patient not taking: Reported on 12/06/2021)   terbinafine (LAMISIL) 250 MG tablet Take 1 tablet by mouth daily. (Patient not taking: Reported on 12/31/2021)   [DISCONTINUED] celecoxib (CELEBREX) 200 MG capsule Take 1 capsule (200 mg total) by mouth 2 (two) times daily. (Patient not taking: Reported on 12/06/2021)   Facility-Administered Encounter Medications as of 05/29/2022  Medication   0.9 %  sodium chloride infusion     Review of Systems  Review of Systems  Constitutional: Negative.   HENT: Negative.    Cardiovascular: Negative.   Gastrointestinal: Negative.   Musculoskeletal:  Positive for arthralgias and myalgias.  Allergic/Immunologic: Negative.   Neurological: Negative.    Psychiatric/Behavioral: Negative.         Physical Exam  BP (!) 142/77   Pulse 70   Temp (!) 97.4 F (36.3 C)   Ht 4\' 10"  (1.473 m)   Wt 155 lb 12.8 oz (70.7 kg)   SpO2 99%   BMI 32.56 kg/m   Wt Readings from Last 5 Encounters:  05/29/22 155 lb 12.8 oz (70.7 kg)  02/27/22 157 lb (71.2 kg)  01/28/22 157 lb (71.2 kg)  12/31/21 158 lb (71.7 kg)  12/06/21 158 lb 9.6 oz (71.9 kg)     Physical Exam Vitals and nursing note reviewed.  Constitutional:      General: She is not in acute distress.    Appearance: She is well-developed.  Cardiovascular:     Rate and Rhythm: Normal rate and regular rhythm.  Pulmonary:     Effort: Pulmonary effort is normal.     Breath sounds: Normal breath sounds.  Neurological:     Mental Status: She is alert and oriented to person, place, and time.      Lab Results:  CBC    Component Value Date/Time   WBC 7.5 08/31/2021 1415   WBC 9.9 08/22/2016 1534   RBC 4.72 08/31/2021 1415   RBC 4.53 08/22/2016 1534   HGB 15.1 08/31/2021 1415   HCT 44.8 08/31/2021 1415   PLT 355 08/31/2021 1415   MCV 95 08/31/2021 1415   MCH 32.0 08/31/2021 1415   MCH 31.3 08/22/2016 1534   MCHC 33.7 08/31/2021 1415   MCHC 34.1 08/22/2016 1534   RDW 12.0 08/31/2021 1415   LYMPHSABS 2.8 04/30/2021 0843   MONOABS 792 08/22/2016 1534   EOSABS 0.1 04/30/2021 0843   BASOSABS 0.1 04/30/2021 0843    BMET    Component Value Date/Time   NA 140 08/31/2021 1415   K 3.9 08/31/2021 1415   CL 99 08/31/2021 1415   CO2 24 08/31/2021 1415   GLUCOSE 87 08/31/2021 1415   GLUCOSE 84 10/07/2016 1536   BUN 10 08/31/2021 1415   CREATININE 0.67 08/31/2021 1415   CREATININE 0.81 10/07/2016 1536   CALCIUM 10.0 08/31/2021 1415   GFRNONAA 96 11/05/2019 1445   GFRNONAA >89 08/22/2016 1534   GFRAA 111 11/05/2019 1445   GFRAA >89 08/22/2016 1534    BNP    Component Value Date/Time   BNP 58.0 08/22/2016 1534    ProBNP No results found for: "PROBNP"  Imaging: No  results found.   Assessment & Plan:   Pain in both lower legs - Ambulatory referral to Orthopedic Surgery  2. Hip pain, acute, right  - Ambulatory referral to Orthopedic Surgery - diclofenac (VOLTAREN) 75 MG EC tablet; Take 1 tablet (75 mg total) by mouth 2 (two) times daily.  Dispense: 30 tablet; Refill: 0 - diclofenac Sodium (VOLTAREN  ARTHRITIS PAIN) 1 % GEL; Apply 2 g topically 4 (four) times daily.  Dispense: 50 g; Refill: 2   Follow up:  Follow up in 3 months     Fenton Foy, NP 05/29/2022

## 2022-05-30 ENCOUNTER — Ambulatory Visit: Payer: Medicare HMO

## 2022-05-30 DIAGNOSIS — R2689 Other abnormalities of gait and mobility: Secondary | ICD-10-CM | POA: Diagnosis not present

## 2022-05-30 DIAGNOSIS — M25551 Pain in right hip: Secondary | ICD-10-CM

## 2022-05-30 DIAGNOSIS — M6281 Muscle weakness (generalized): Secondary | ICD-10-CM

## 2022-05-30 NOTE — Therapy (Addendum)
OUTPATIENT PHYSICAL THERAPY TREATMENT NOTE/DISCHARGE  PHYSICAL THERAPY DISCHARGE SUMMARY  Visits from Start of Care: 12  Current functional level related to goals / functional outcomes: See goals and objective   Remaining deficits: See goals and objective   Education / Equipment: HEP   Patient agrees to discharge. Patient goals were partially met. Patient is being discharged due to maximized rehab potential.    Patient Name: Christy Price MRN: [id number] DOB:11-May-1970, 52 y.o., female Today's Date: 05/30/2022  PCP: Vevelyn Francois, NP  REFERRING PROVIDER: Fenton Foy, NP   END OF SESSION:   PT End of Session - 05/30/22 1450     Visit Number 12    Number of Visits 13    Date for PT Re-Evaluation 05/30/22    Authorization Type [employer] MCR and MCD Secondary    PT Start Time 1450   arrived late   PT Stop Time 1530    PT Time Calculation (min) 40 min    Activity Tolerance Patient tolerated treatment well    Behavior During Therapy WFL for tasks assessed/performed                 Past Medical History:  Diagnosis Date   Allergy    seasonal   Anxiety    Arthritis    shoulder,knees,feet   Back spasm    Cancer ([place])     hx cervical cx    Depression    GERD (gastroesophageal reflux disease)    Hypertension    Meningitis    Seizures ([place])    greater than 25 years ago   Past Surgical History:  Procedure Laterality Date   carpal tunnel Left    LAPAROSCOPIC HYSTERECTOMY     2000   SHUNT REMOVAL     2007   SHUNT REPLACEMENT     x 4   Patient Active Problem List   Diagnosis Date Noted   Anxiety and depression 02/27/2022   Language barrier 05/09/2019   Chronic pain of right knee 05/09/2019   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   Peripheral edema 11/04/2018   Essential hypertension 11/04/2018   Rash 11/04/2018   Pain in both lower legs 12/12/2016   Blepharitis of upper and lower eyelids of both eyes 09/24/2016   Keratoconjunctivitis sicca of both  eyes not specified as Sjogren's 09/24/2016   Presbyopia of both eyes 09/24/2016   Vitreous floater, bilateral 09/24/2016   Partial nontraumatic tear of right rotator cuff 04/03/2016   Nail fungus 03/30/2015   Toe pain 03/30/2015   Insomnia 12/19/2014   Pedal edema 11/06/2014   Depression 11/06/2014   Genital herpes 11/06/2014   Back pain 11/06/2014    REFERRING DIAG: M25.551 (ICD-10-CM) - Hip pain, acute, right    THERAPY DIAG:  Pain in right hip  Muscle weakness (generalized)  Rationale for Evaluation and Treatment Rehabilitation  PERTINENT HISTORY: HTN   PRECAUTIONS: None   SUBJECTIVE:  SUBJECTIVE STATEMENT: Pt presents to PT with reports of continued pain and discomfort in R hip and R LE.   PAIN:  Are you having pain?  Yes: NPRS scale: 6/10 Worst: 8/10 Pain location: R hip Pain description: sharp, tingling Aggravating factors: certain movements Relieving factors: none   OBJECTIVE: (objective measures completed at initial evaluation unless otherwise dated)  DIAGNOSTIC FINDINGS:             See imaging   PATIENT SURVEYS:  FOTO: 51% function; 63% predicted  04/16/22: 56% 05/02/22: 64%   COGNITION: Overall cognitive status: Within functional limits for tasks assessed                         SENSATION: Light touch: Impaired - lateral R LE   POSTURE: rounded shoulders, forward head, and medium body habitus   PALPATION: No warmth noted to distal R LE; TTP to R gluteals, R piriformis   LOWER EXTREMITY MMT:   MMT Right eval Left eval Right 05/30/2022 Left 05/30/2022  Hip flexion 3+/5 4/5 4/5 4/5  Hip extension        Hip abduction 3+/5 p! 4/5 4/5 4/5  Hip adduction 4/5 4/5 4/5 4/5  Hip internal rotation        Hip external rotation        Knee flexion        Knee extension         Ankle dorsiflexion        Ankle plantarflexion        Ankle inversion        Ankle eversion         (Blank rows = not tested)   LOWER EXTREMITY SPECIAL TESTS:  Hip special tests: Piriformis test: positive    FUNCTIONAL TESTS:  30 Second Sit to Stand: 6 reps - 05/30/2022   GAIT: Distance walked: 59ft Assistive device utilized: None Level of assistance: Complete Independence Comments: antalgic gait on R LE     TREATMENT: [facility] Adult PT Treatment:                                                DATE: 05/30/22 Therapeutic Exercise: STS x 10 LTR x10 Bridge x 10 Supine clamshell 2x15 BTB Supine SLR x 10 each Standing hip abd/ext x10 each Therapeutic Activity: Assessment of tests/measures for discharge  [facility] Adult PT Treatment:                                                DATE: 05/23/22 Therapeutic Exercise: NuStep lvl 5 UE/LE x 5 min while taking subjective STS x 10 LTR x10 Bridge with ball 2x10 Supine clamshell 2x15 GTB Supine SLR x 10 each STS x 15  [facility] Adult PT Treatment:                                                DATE: 05/16/22 Therapeutic Exercise: NuStep lvl 6 LE only x 3 min while taking subjective STS x 10 LTR x10 S/L clamshell 2x10 R - painful Seated clamshell 2x10 GTB  Seated pilates ring squeeze x 10 - pain Standing hip abd 2x10 2.5# Step ups 2x10 8in R leading  [facility] Adult PT Treatment:                                                DATE: 05/02/22 Therapeutic Exercise: NuStep lvl 6 UE/LE x 5 min while taking subjective LTR x10 SLR x 10 Bridge 2x10 STS 2x10 - 2in step Lateral walk GTB x 2 laps Standing hip abd/ext x 15 each  [facility] Adult PT Treatment:                                                DATE: 04/16/22 Therapeutic Exercise: NuStep lvl 6 UE/LE x 5 min while taking subjective LTR x10 SLR x 10 Bridge 2x10 STS 2x10 - 2in step Lateral walk GTB x 2 laps Standing hip abd/exy x 15 each  [facility] Adult PT Treatment:                                                 DATE: 04/16/22 Therapeutic Exercise: NuStep lvl 6 UE/LE x 5 min while taking subjective LTR x10 SLR x 10, x3 (pain and weakness) Supine marching against GTB 2x1' BIL Bridge 2x10 Supine clamshell 3x10 GTB STS 2x10 - 2in step Therapeutic Activity: Re-administration of FOTO - increased time, patient needed interpreter to help her with questions  [facility] Adult PT Treatment:                                                DATE: 04/09/2022 Therapeutic Exercise: NuStep lvl 5 UE/LE x 4 min while taking subjective LTR x 10 Bridge 2x10 Supine clamshell 2x15 GTB STS 2x10 - 4in step Standing hip abd 2x10 YTB Standing mini squats x 15  [facility] Adult PT Treatment:                                                DATE: 04/04/2022 Therapeutic Exercise: LTR x 10  Bridge 2x10 Seated clamshell 2x15 GTB STS 2x10 - 4 in step under feet Standing mini squat x 10  Standing hip abd/ext 2x10   PATIENT EDUCATION:  Education details: continue HEP, updated POC Person educated: Patient Education method: Explanation, Demonstration, and Handouts Education comprehension: verbalized understanding and returned demonstration   HOME EXERCISE PROGRAM: Access Code: [email or web address] URL: [email or web address] Date: 05/30/2022 Prepared by: Octavio Manns  Exercises - Hooklying Clamshell with Resistance  - 3-4 x weekly - 3 sets - 10 reps - blue  hold - Supine Lower Trunk Rotation  - 3-4 x weekly - 2 sets - 10 reps - Supine Bridge  - 3-4 x weekly - 3 sets - 10 reps - Active Straight Leg Raise with Quad Set  - 3-4 x weekly - 3 sets - 10 reps - Standing Hip Abduction  with Counter Support  - 3-4 x weekly - 2 sets - 10 reps - Standing Hip Extension with Counter Support  - 3-4 x weekly - 2 sets - 10 reps   ASSESSMENT:   CLINICAL IMPRESSION: Pt was able to complete prescribed exercises and demonstrated knowledge of HEP with no adverse effect. Over the course of PT treatment she progressed fairly well,  noting slight decrease in pain and improved subjective functional ability assessed via FOTO. Unfortunately she has appeared to maximize current therapeutic benefit, with pt continuing to have significant pain and unable to progress therapeutic interventions. She should maintain current improvements with HEP compliance but no longer requires skilled PT services at this time.   OBJECTIVE IMPAIRMENTS: decreased activity tolerance, decreased mobility, difficulty walking, decreased ROM, decreased strength, and pain.    ACTIVITY LIMITATIONS: carrying, lifting, standing, squatting, stairs, and bed mobility   PARTICIPATION LIMITATIONS: driving, community activity, occupation, and yard work   PERSONAL FACTORS: 1-2 comorbidities: HTN  are also affecting patient's functional outcome.      GOALS: Goals reviewed with patient? No   SHORT TERM GOALS: Target date: 04/08/2022   Pt will be compliant and knowledgeable with initial HEP for improved comfort and carryover Baseline: initial HEP given  Goal status: MET Pt reports adherence 04/16/22   2.  Pt will self report R hip pain no greater than 5/10 for improved comfort and functional ability Baseline: 8/10 at worst Goal status: MET   LONG TERM GOALS: Target date: 05/30/2022    Pt will self report R hip pain no greater than 2/10 for improved comfort and functional ability Baseline: 8/10 at worst Goal status: NOT MET   2.  Pt will improve FOTO function score to no less than predicted as proxy for functional improvement Baseline: 51% function; 63% predicted  05/02/2022: 64% function Goal status: MET   3.  Pt will increase 30 Second Sit to Stand rep count to no less than 8 reps for improved balance, strength, and functional mobility Baseline: 6 reps  05/02/2022: 6 reps - no pain 05/30/2022: 6 reps Goal status: NOT MET   4.  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  Pt will improve R hip flex/abd to no less than 4/5 for improved comfort and functional mobility Baseline: see chart Goal status: MET   PLAN:   PT FREQUENCY: 1x/week   PT DURATION: 4 weeks   PLANNED INTERVENTIONS: Therapeutic exercises, Therapeutic activity, Neuromuscular re-education, Balance training, Gait training, Patient/Family education, Self Care, Joint mobilization, Aquatic Therapy, Dry Needling, Cryotherapy, Moist heat, Manual therapy, and Re-evaluation   PLAN FOR NEXT SESSION: proximal hip strengthening   Ward Chatters, PT 05/30/2022, 3:45 PM

## 2022-06-03 ENCOUNTER — Encounter: Payer: Self-pay | Admitting: Orthopedic Surgery

## 2022-06-03 NOTE — Progress Notes (Signed)
Office Visit Note   Patient: Christy Price           Date of Birth: Apr 14, 1970           MRN: [id number] Visit Date: 05/21/2022              Requested by: Fenton Foy, NP [phone] [address] [place],  [place] [address] PCP: Fenton Foy, NP  Chief Complaint  Patient presents with   Right Knee - Pain   Right Hip - Pain      HPI: Patient is a 52 year old woman who presents with right lower extremity radicular pain.  Patient states the pain is worse getting in and out of the car she has had difficulty sleeping radiates to her knee complains of tingling pain in the calf.  She states the leg feels heavy.  She is on Neurontin.  She states she is not on anti-inflammatories.  Assessment & Plan: Visit Diagnoses:  1. Pain in right leg   2. Radicular pain of right lower extremity     Plan: Will call in a prescription for low-dose prednisone.  Follow-Up Instructions: No follow-ups on file.   Ortho Exam  Patient is alert, oriented, no adenopathy, well-dressed, normal affect, normal respiratory effort. Examination patient has a negative straight leg rise bilaterally.  There is no motor weakness in either lower extremity.  There is no pain with range of motion of the hips.  The reviewed portion of the hips on the lumbar spine shows maintenance of the joint space.  Imaging: No results found. No images are attached to the encounter.  Labs: Lab Results  Component Value Date   HGBA1C 4.8 05/07/2019   HGBA1C 4.7 10/07/2017   HGBA1C 4.6 08/22/2016   ESRSEDRATE 3 07/24/2007   REPTSTATUS 09/19/2007 FINAL 09/18/2007   REPTSTATUS 09/22/2007 FINAL 09/18/2007   GRAMSTAIN  08/11/2007    WBC PRESENT, PREDOMINANTLY PMN NO ORGANISMS SEEN CYTOSPIN SLIDE Gram Stain Report Called to,Read Back By and Verified With: BROUSSARD E.,RN 08/11/07 2055 BY DUNCANJ   GRAMSTAIN  08/11/2007    CYTOSPIN WBC PRESENT,BOTH PMN AND MONONUCLEAR NO ORGANISMS SEEN   GRAMSTAIN  08/11/2007     WBC PRESENT, PREDOMINANTLY PMN NO ORGANISMS SEEN CYTOSPIN SLIDE Gram Stain Report Called to,Read Back By and Verified With: BROUSSARD E.,RN 08/11/07 2055 BY DUNCANJ   CULT  09/18/2007    NO SALMONELLA, SHIGELLA, CAMPYLOBACTER, OR YERSINIA ISOLATED Note: REDUCED NORMAL FLORA PRESENT     Lab Results  Component Value Date   ALBUMIN 4.8 08/31/2021   ALBUMIN 4.9 04/30/2021   ALBUMIN 5.0 (H) 10/23/2020    No results found for: "MG" Lab Results  Component Value Date   VD25OH 30.2 11/05/2019   VD25OH 29.0 (L) 11/04/2018   VD25OH 34 10/07/2016    No results found for: "PREALBUMIN"    Latest Ref Rng & Units 08/31/2021    2:15 PM 04/30/2021    8:43 AM 11/05/2019    2:45 PM  CBC EXTENDED  WBC 3.4 - 10.8 x10E3/uL 7.5  7.9  8.5   RBC 3.77 - 5.28 x10E6/uL 4.72  4.79  4.86   Hemoglobin 11.1 - 15.9 g/dL 15.1  15.1  16.1   HCT 34.0 - 46.6 % 44.8  45.5  45.6   Platelets 150 - 450 x10E3/uL 355  307  310   NEUT# 1.4 - 7.0 x10E3/uL  4.2  4.6   Lymph# 0.7 - 3.1 x10E3/uL  2.8  2.7  There is no height or weight on file to calculate BMI.  Orders:  Orders Placed This Encounter  Procedures   XR Lumbar Spine 2-3 Views   Meds ordered this encounter  Medications   predniSONE (DELTASONE) 10 MG tablet    Sig: Take 1 tablet (10 mg total) by mouth daily with breakfast.    Dispense:  30 tablet    Refill:  0     Procedures: No procedures performed  Clinical Data: No additional findings.  ROS:  All other systems negative, except as noted in the HPI. Review of Systems  Objective: Vital Signs: There were no vitals taken for this visit.  Specialty Comments:  No specialty comments available.  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   Peripheral edema 11/04/2018   Essential hypertension 11/04/2018   Rash 11/04/2018    Pain in both lower legs 12/12/2016   Blepharitis of upper and lower eyelids of both eyes 09/24/2016   Keratoconjunctivitis sicca of both eyes not specified as Sjogren's 09/24/2016   Presbyopia of both eyes 09/24/2016   Vitreous floater, bilateral 09/24/2016   Partial nontraumatic tear of right rotator cuff 04/03/2016   Nail fungus 03/30/2015   Toe pain 03/30/2015   Insomnia 12/19/2014   Pedal edema 11/06/2014   Depression 11/06/2014   Genital herpes 11/06/2014   Back pain 11/06/2014   Past Medical History:  Diagnosis Date   Allergy    seasonal   Anxiety    Arthritis    shoulder,knees,feet   Back spasm    Cancer ([facility])     hx cervical cx    Depression    GERD (gastroesophageal reflux disease)    Hypertension    Meningitis    Seizures ([place])    greater than 25 years ago    Family History  Problem Relation Age of Onset   Crohn's disease Mother    Breast cancer Sister    Colon cancer Neg Hx    Colon polyps Neg Hx    Esophageal cancer Neg Hx    Rectal cancer Neg Hx    Stomach cancer Neg Hx    Ulcerative colitis Neg Hx     Past Surgical History:  Procedure Laterality Date   carpal tunnel Left    LAPAROSCOPIC HYSTERECTOMY     2000   SHUNT REMOVAL     2007   SHUNT REPLACEMENT     x 4   Social History   Occupational History   Not on file  Tobacco Use   Smoking status: Every Day    Packs/day: 1.5    Types: Cigarettes    Passive exposure: Never   Smokeless tobacco: Never   Tobacco comments:    1 pack a week.  Vaping Use   Vaping Use: Never used  Substance and Sexual Activity   Alcohol use: Yes    Alcohol/week: 0.0 standard drinks of alcohol    Comment: Social   Drug use: No   Sexual activity: Not Currently

## 2022-06-06 ENCOUNTER — Ambulatory Visit: Payer: Medicare HMO

## 2022-06-07 ENCOUNTER — Ambulatory Visit (INDEPENDENT_AMBULATORY_CARE_PROVIDER_SITE_OTHER): Payer: Medicare HMO | Admitting: Nurse Practitioner

## 2022-06-07 ENCOUNTER — Encounter: Payer: Self-pay | Admitting: Nurse Practitioner

## 2022-06-07 VITALS — BP 134/66 | HR 67 | Temp 97.4°F | Ht 59.0 in | Wt 155.8 lb

## 2022-06-07 DIAGNOSIS — R1012 Left upper quadrant pain: Secondary | ICD-10-CM | POA: Diagnosis not present

## 2022-06-07 MED ORDER — PANTOPRAZOLE SODIUM 40 MG PO TBEC: 40.0000 mg | DELAYED_RELEASE_TABLET | Freq: Every day | ORAL | 0 refills | Status: DC

## 2022-06-07 NOTE — Patient Instructions (Addendum)
1. Left upper quadrant abdominal pain  - Amylase - Lipase - pantoprazole (PROTONIX) 40 MG tablet; Take 1 tablet (40 mg total) by mouth daily.  Dispense: 60 tablet; Refill: 0 - CMP14+EGFR   Please makes sure to take diclofenac with foods, this medication can cause stomach upset without foods.    It is important that you exercise regularly at least 30 minutes 5 times a week as tolerated  Think about what you will eat, plan ahead. Choose " clean, green, fresh or frozen" over canned, processed or packaged foods which are more sugary, salty and fatty. 70 to 75% of food eaten should be vegetables and fruit. Three meals at set times with snacks allowed between meals, but they must be fruit or vegetables. Aim to eat over a 12 hour period , example 7 am to 7 pm, and STOP after  your last meal of the day. Drink water,generally about 64 ounces per day, no other drink is as healthy. Fruit juice is best enjoyed in a healthy way, by EATING the fruit.  Thanks for choosing Patient Christy Price we consider it a privelige to serve you.

## 2022-06-07 NOTE — Assessment & Plan Note (Addendum)
Will do a trial of pantoprazole 40 mg daily Patient encouraged to take diclofenac with meals to help prevent GI upset. Avoid fatty food fried foods spicy food caffeinated drinks alcohol Check amylase, lipase, CMP Follow-up in 4 weeks

## 2022-06-07 NOTE — Progress Notes (Signed)
Acute Office Visit  Subjective:     Patient ID: Christy Price, female    DOB: 10-24-1970, 52 y.o.   MRN: [id number]  Chief Complaint  Patient presents with   Flank Pain    Ulq burning for about two weeks     HPI Christy Price  has a past medical history of Allergy, Anxiety, Arthritis, Back spasm, Cancer ([place]), Depression, GERD (gastroesophageal reflux disease), Hypertension, Meningitis, and Seizures ([place]).   Patient complains of burning pain on her right upper quadrant that started about 2 weeks ago.  Her pain is not associated with eating, sleeping on a pillow sometimes helps her burning sensation.  She reports intermittent acid reflux, drinks alcohol sometimes.  She denies trauma, fever, chills, numbness, tingling, nausea, vomiting, diarrhea.  She is currently taking diclofenac tablet  right hip pain  Interpretation services provided by a sign language interpreter.   Review of Systems  Constitutional: Negative.   Respiratory: Negative.    Cardiovascular: Negative.   Gastrointestinal:  Positive for abdominal pain and heartburn. Negative for blood in stool, constipation, diarrhea, nausea and vomiting.  Genitourinary: Negative.   Neurological: Negative.   Psychiatric/Behavioral: Negative.      Sign language interpreter.     Objective:    BP 134/66   Pulse 67   Temp (!) 97.4 F (36.3 C)   Ht 4\' 11"  (1.499 m)   Wt 155 lb 12.8 oz (70.7 kg)   SpO2 100%   BMI 31.47 kg/m    Physical Exam Constitutional:      General: She is not in acute distress.    Appearance: She is obese. She is not ill-appearing, toxic-appearing or diaphoretic.  Cardiovascular:     Rate and Rhythm: Normal rate and regular rhythm.     Pulses: Normal pulses.     Heart sounds: Normal heart sounds. No murmur heard.    No friction rub. No gallop.  Pulmonary:     Effort: Pulmonary effort is normal. No respiratory distress.     Breath sounds: Normal breath sounds. No stridor. No wheezing, rhonchi or  rales.  Chest:     Chest wall: No tenderness.  Abdominal:     General: There is no distension.     Palpations: Abdomen is soft. There is no mass.     Tenderness: There is abdominal tenderness. There is no guarding.     Hernia: No hernia is present.     Comments: Tenderness on palpation of left upper quadrant below the rib cage, mild epigastric tenderness on palpation  Musculoskeletal:        General: No tenderness, deformity or signs of injury.  Skin:    General: Skin is warm and dry.  Neurological:     Mental Status: She is alert and oriented to person, place, and time.     Motor: No weakness.     Coordination: Coordination normal.     Gait: Gait normal.  Psychiatric:        Mood and Affect: Mood normal.        Behavior: Behavior normal.        Thought Content: Thought content normal.        Judgment: Judgment normal.     No results found for any visits on 06/07/22.      Assessment & Plan:   Problem List Items Addressed This Visit       Other   Left upper quadrant abdominal pain - Primary    Will do a trial  of pantoprazole 40 mg daily Patient encouraged to take diclofenac with meals to help prevent GI upset. Avoid fatty food fried foods spicy food caffeinated drinks alcohol Check amylase, lipase, CMP Follow-up in 4 weeks      Relevant Medications   pantoprazole (PROTONIX) 40 MG tablet   Other Relevant Orders   Amylase   Lipase   CMP14+EGFR    Meds ordered this encounter  Medications   pantoprazole (PROTONIX) 40 MG tablet    Sig: Take 1 tablet (40 mg total) by mouth daily.    Dispense:  60 tablet    Refill:  0    Return in about 4 weeks (around 07/05/2022) for abdominal pain.  Renee Rival, FNP

## 2022-06-08 LAB — CMP14+EGFR
ALT: 11 IU/L (ref 0–32)
AST: 18 IU/L (ref 0–40)
Albumin/Globulin Ratio: 2.1 (ref 1.2–2.2)
Albumin: 4.4 g/dL (ref 3.8–4.9)
Alkaline Phosphatase: 101 IU/L (ref 44–121)
BUN/Creatinine Ratio: 20 (ref 9–23)
BUN: 12 mg/dL (ref 6–24)
Bilirubin Total: <0.2 mg/dL (ref 0.0–1.2)
CO2: 24 mmol/L (ref 20–29)
Calcium: 9.1 mg/dL (ref 8.7–10.2)
Chloride: 100 mmol/L (ref 96–106)
Creatinine, Ser: 0.61 mg/dL (ref 0.57–1.00)
Globulin, Total: 2.1 g/dL (ref 1.5–4.5)
Glucose: 71 mg/dL (ref 70–99)
Potassium: 3.3 mmol/L — ABNORMAL LOW (ref 3.5–5.2)
Sodium: 140 mmol/L (ref 134–144)
Total Protein: 6.5 g/dL (ref 6.0–8.5)
eGFR: 107 mL/min/{1.73_m2} (ref >59)

## 2022-06-08 LAB — LIPASE: Lipase: 42 U/L (ref 14–72)

## 2022-06-08 LAB — AMYLASE: Amylase: 140 U/L — ABNORMAL HIGH (ref 31–110)

## 2022-06-10 DIAGNOSIS — H469 Unspecified optic neuritis: Secondary | ICD-10-CM | POA: Diagnosis not present

## 2022-06-10 DIAGNOSIS — H2513 Age-related nuclear cataract, bilateral: Secondary | ICD-10-CM | POA: Diagnosis not present

## 2022-06-19 ENCOUNTER — Ambulatory Visit (INDEPENDENT_AMBULATORY_CARE_PROVIDER_SITE_OTHER): Payer: Medicare HMO | Admitting: Orthopaedic Surgery

## 2022-06-19 DIAGNOSIS — M5410 Radiculopathy, site unspecified: Secondary | ICD-10-CM

## 2022-06-19 DIAGNOSIS — M67959 Unspecified disorder of synovium and tendon, unspecified thigh: Secondary | ICD-10-CM

## 2022-06-19 NOTE — Progress Notes (Signed)
Chief Complaint: Right hip pain     History of Present Illness:    Christy Price is a 52 y.o. female presents today with ongoing right hip pain which has been painful on and off since December 2023.  She has been working in physical therapy for strengthening of the right hip without any pain.  The pain is radiating from the posterior buttocks area to the right lateral trochanter and down the lateral IT band.  This is hardwood laying directly on the side.  She is having hard time getting up and down stairs.  She does have a positive Trendelenburg gait as well.  Here today with her interpreter.  She does speak send language.   Surgical History:   None  PMH/PSH/Family History/Social History/Meds/Allergies:    Past Medical History:  Diagnosis Date   Allergy    seasonal   Anxiety    Arthritis    shoulder,knees,feet   Back spasm    Cancer (HCC)     hx cervical cx    Depression    GERD (gastroesophageal reflux disease)    Hypertension    Meningitis    Seizures (HCC)    greater than 25 years ago   Past Surgical History:  Procedure Laterality Date   carpal tunnel Left    LAPAROSCOPIC HYSTERECTOMY     2000   SHUNT REMOVAL     2007   SHUNT REPLACEMENT     x 4   Social History   Socioeconomic History   Marital status: Married    Spouse name: Not on file   Number of children: Not on file   Years of education: Not on file   Highest education level: Not on file  Occupational History   Not on file  Tobacco Use   Smoking status: Every Day    Packs/day: 1.5    Types: Cigarettes    Passive exposure: Never   Smokeless tobacco: Never   Tobacco comments:    1 pack a week.  Vaping Use   Vaping Use: Never used  Substance and Sexual Activity   Alcohol use: Yes    Alcohol/week: 0.0 standard drinks of alcohol    Comment: Social   Drug use: No   Sexual activity: Not Currently  Other Topics Concern   Not on file  Social History  Narrative   Not on file   Social Determinants of Health   Financial Resource Strain: Not on file  Food Insecurity: Not on file  Transportation Needs: Not on file  Physical Activity: Not on file  Stress: Not on file  Social Connections: Not on file   Family History  Problem Relation Age of Onset   Crohn's disease Mother    Breast cancer Sister    Colon cancer Neg Hx    Colon polyps Neg Hx    Esophageal cancer Neg Hx    Rectal cancer Neg Hx    Stomach cancer Neg Hx    Ulcerative colitis Neg Hx    Allergies  Allergen Reactions   Epinephrine Other (See Comments)   Oxycodone    Rifampin    Current Outpatient Medications  Medication Sig Dispense Refill   Blood Pressure Monitor MISC Use as directed daily 1 each 0   Cholecalciferol (D3 PO) Take by mouth. (Patient not taking: Reported  on 06/07/2022)     cyclobenzaprine (FLEXERIL) 10 MG tablet Take 1 tablet (10 mg total) by mouth 3 (three) times daily as needed for muscle spasms. (Patient not taking: Reported on 12/06/2021) 30 tablet 6   diclofenac (VOLTAREN) 75 MG EC tablet Take 1 tablet (75 mg total) by mouth 2 (two) times daily. 30 tablet 0   diclofenac Sodium (VOLTAREN ARTHRITIS PAIN) 1 % GEL Apply 2 g topically 4 (four) times daily. 50 g 2   furosemide (LASIX) 20 MG tablet Take 1 tablet (20 mg total) by mouth daily. 90 tablet 3   gabapentin (NEURONTIN) 300 MG capsule Take 1 capsule (300 mg total) by mouth 2 (two) times daily for 10 days. 20 capsule 0   gabapentin (NEURONTIN) 300 MG capsule Take 300 mg by mouth as needed.     hydrochlorothiazide (HYDRODIURIL) 25 MG tablet Take 1 tablet (25 mg total) by mouth daily. 90 tablet 3   hydrOXYzine (ATARAX/VISTARIL) 10 MG tablet Take 1 tablet (10 mg total) by mouth 3 (three) times daily as needed. 30 tablet 0   lisinopril (ZESTRIL) 10 MG tablet Take 1 tablet (10 mg total) by mouth daily. (Patient not taking: Reported on 06/07/2022) 90 tablet 3   pantoprazole (PROTONIX) 40 MG tablet Take 1  tablet (40 mg total) by mouth daily. 60 tablet 0   predniSONE (DELTASONE) 10 MG tablet Take 1 tablet (10 mg total) by mouth daily with breakfast. (Patient not taking: Reported on 06/07/2022) 30 tablet 0   terbinafine (LAMISIL) 250 MG tablet Take 1 tablet by mouth daily.     zolpidem (AMBIEN) 10 MG tablet Take 10 mg by mouth at bedtime as needed for sleep. (Patient not taking: Reported on 06/07/2022)     Current Facility-Administered Medications  Medication Dose Route Frequency Provider Last Rate Last Admin   0.9 %  sodium chloride infusion  500 mL Intravenous Once Jenel Lucks, MD       No results found.  Review of Systems:   A ROS was performed including pertinent positives and negatives as documented in the HPI.  Physical Exam :   Constitutional: NAD and appears stated age Neurological: Alert and oriented Psych: Appropriate affect and cooperative There were no vitals taken for this visit.   Comprehensive Musculoskeletal Exam:    Tenderness palpation about the right hip over the greater trochanter.  Range of motion about the right hip is 30 degrees internal/external rotation without any type of femoral acetabular pain.  There is some weakness with resisted abduction.  Positive Trendelenburg gait with standing on this leg.  Imaging:   Xray (4 views right hip): Mild femoral acetabular narrowing overall well-preserved hip    I personally reviewed and interpreted the radiographs.   Assessment:   52 y.o. 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  At this time she has failed a trial of physical therapy.  To that effect I do believe an MRI would be needed in order to rule out any type of gluteus tearing.  I did also discuss that we could potentially perform an injection.  I will plan to see her back following MRI of the lumbar spine with results  Plan :    -Plan for MRI right hip and follow-up to discuss results     I personally saw and  evaluated the patient, and participated in the management and treatment plan.  Huel Cote, MD Attending Physician, Orthopedic Surgery  This document was dictated using Dragon voice  recognition software. A reasonable attempt at proof reading has been made to minimize errors.

## 2022-06-20 ENCOUNTER — Ambulatory Visit: Payer: Medicare HMO | Admitting: Orthopedic Surgery

## 2022-06-27 ENCOUNTER — Other Ambulatory Visit: Payer: Self-pay

## 2022-06-27 ENCOUNTER — Telehealth: Payer: Self-pay

## 2022-06-27 DIAGNOSIS — R600 Localized edema: Secondary | ICD-10-CM

## 2022-06-27 DIAGNOSIS — R1012 Left upper quadrant pain: Secondary | ICD-10-CM

## 2022-06-27 DIAGNOSIS — I10 Essential (primary) hypertension: Secondary | ICD-10-CM

## 2022-06-27 MED ORDER — FUROSEMIDE 20 MG PO TABS
20.0000 mg | ORAL_TABLET | Freq: Every day | ORAL | 3 refills | Status: DC
Start: 2022-06-27 — End: 2023-04-28

## 2022-06-27 MED ORDER — GABAPENTIN 300 MG PO CAPS: 300.0000 mg | ORAL_CAPSULE | ORAL | 1 refills | Status: DC | PRN

## 2022-06-27 MED ORDER — ESCITALOPRAM OXALATE 10 MG PO TABS
10.0000 mg | ORAL_TABLET | Freq: Every day | ORAL | 0 refills | Status: DC
Start: 2022-06-27 — End: 2022-09-19

## 2022-06-27 MED ORDER — HYDROCHLOROTHIAZIDE 25 MG PO TABS
25.0000 mg | ORAL_TABLET | Freq: Every day | ORAL | 3 refills | Status: DC
Start: 2022-06-27 — End: 2023-04-29

## 2022-06-27 MED ORDER — PANTOPRAZOLE SODIUM 40 MG PO TBEC
40.0000 mg | DELAYED_RELEASE_TABLET | Freq: Every day | ORAL | 0 refills | Status: DC
Start: 2022-06-27 — End: 2022-07-05

## 2022-06-27 NOTE — Telephone Encounter (Signed)
Please advise and if so it will need to go to [facility]. KH

## 2022-06-27 NOTE — Telephone Encounter (Signed)
Lvm for pt to call back and advise if she is using [facility]  or [facility]. KH

## 2022-07-01 ENCOUNTER — Other Ambulatory Visit: Payer: Self-pay

## 2022-07-01 NOTE — Telephone Encounter (Signed)
Error

## 2022-07-05 ENCOUNTER — Ambulatory Visit (INDEPENDENT_AMBULATORY_CARE_PROVIDER_SITE_OTHER): Payer: Medicare HMO | Admitting: Nurse Practitioner

## 2022-07-05 ENCOUNTER — Encounter: Payer: Self-pay | Admitting: Nurse Practitioner

## 2022-07-05 VITALS — BP 141/66 | HR 60 | Temp 97.2°F | Wt 154.2 lb

## 2022-07-05 DIAGNOSIS — R1012 Left upper quadrant pain: Secondary | ICD-10-CM | POA: Diagnosis not present

## 2022-07-05 MED ORDER — PANTOPRAZOLE SODIUM 40 MG PO TBEC: 40.0000 mg | DELAYED_RELEASE_TABLET | Freq: Every day | ORAL | 0 refills | Status: DC

## 2022-07-05 MED ORDER — TERBINAFINE HCL 250 MG PO TABS: 250.0000 mg | ORAL_TABLET | Freq: Every day | ORAL | 2 refills | Status: DC

## 2022-07-05 NOTE — Assessment & Plan Note (Signed)
Resolved  Continue Protonix   Follow up:  Follow up as scheduled

## 2022-07-05 NOTE — Progress Notes (Signed)
@Patient  ID: Christy Price, female    DOB: 03-10-1971, 52 y.o.   MRN: [id number]  Chief Complaint  Patient presents with   Abdominal Pain    Follow up per pt she is better     Referring provider: Ivonne Andrew, NP   HPI   Patient presents today for follow-up on abdominal pain.  She was seen by Mitzi Davenport on 06/07/2022 for abdominal pain.  She was started on Protonix.  She was advised to take diclofenac with meals to help prevent GI upset.  Patient states that she is doing much better now.  Potassium was slightly low at last check we will recheck this today.  Denies f/c/s, n/v/d, hemoptysis, PND, leg swelling Denies chest pain or edema  Did not take blood pressure medication today   Confused about medications- will have her return for nurse check to check medications     Allergies  Allergen Reactions   Epinephrine Other (See Comments)   Oxycodone    Rifampin     Immunization History  Administered Date(s) Administered   Influenza,inj,Quad PF,6+ Mos 11/04/2018, 12/28/2019   PFIZER(Purple Top)SARS-COV-2 Vaccination 06/10/2019, 07/06/2019, 04/07/2020   Tdap 04/09/2017    Past Medical History:  Diagnosis Date   Allergy    seasonal   Anxiety    Arthritis    shoulder,knees,feet   Back spasm    Cancer (HCC)     hx cervical cx    Depression    GERD (gastroesophageal reflux disease)    Hypertension    Meningitis    Seizures (HCC)    greater than 25 years ago    Tobacco History: Social History   Tobacco Use  Smoking Status Every Day   Packs/day: 1.5   Types: Cigarettes   Passive exposure: Never  Smokeless Tobacco Never  Tobacco Comments   1 pack a week.   Ready to quit: Not Answered Counseling given: Not Answered Tobacco comments: 1 pack a week.   Outpatient Encounter Medications as of 07/05/2022  Medication Sig   Blood Pressure Monitor MISC Use as directed daily   diclofenac (VOLTAREN) 75 MG EC tablet Take 1 tablet (75 mg total) by mouth 2 (two)  times daily.   diclofenac Sodium (VOLTAREN ARTHRITIS PAIN) 1 % GEL Apply 2 g topically 4 (four) times daily.   escitalopram (LEXAPRO) 10 MG tablet Take 1 tablet (10 mg total) by mouth daily.   furosemide (LASIX) 20 MG tablet Take 1 tablet (20 mg total) by mouth daily.   gabapentin (NEURONTIN) 300 MG capsule Take 1 capsule (300 mg total) by mouth 2 (two) times daily for 10 days.   hydrochlorothiazide (HYDRODIURIL) 25 MG tablet Take 1 tablet (25 mg total) by mouth daily.   hydrOXYzine (ATARAX/VISTARIL) 10 MG tablet Take 1 tablet (10 mg total) by mouth 3 (three) times daily as needed.   [DISCONTINUED] pantoprazole (PROTONIX) 40 MG tablet Take 1 tablet (40 mg total) by mouth daily.   Cholecalciferol (D3 PO) Take by mouth. (Patient not taking: Reported on 06/07/2022)   cyclobenzaprine (FLEXERIL) 10 MG tablet Take 1 tablet (10 mg total) by mouth 3 (three) times daily as needed for muscle spasms. (Patient not taking: Reported on 12/06/2021)   gabapentin (NEURONTIN) 300 MG capsule Take 1 capsule (300 mg total) by mouth as needed.   lisinopril (ZESTRIL) 10 MG tablet Take 1 tablet (10 mg total) by mouth daily. (Patient not taking: Reported on 06/07/2022)   pantoprazole (PROTONIX) 40 MG tablet Take 1 tablet (40 mg total)  by mouth daily.   predniSONE (DELTASONE) 10 MG tablet Take 1 tablet (10 mg total) by mouth daily with breakfast. (Patient not taking: Reported on 06/07/2022)   terbinafine (LAMISIL) 250 MG tablet Take 1 tablet (250 mg total) by mouth daily.   zolpidem (AMBIEN) 10 MG tablet Take 10 mg by mouth at bedtime as needed for sleep. (Patient not taking: Reported on 06/07/2022)   [DISCONTINUED] terbinafine (LAMISIL) 250 MG tablet Take 1 tablet by mouth daily. (Patient not taking: Reported on 07/05/2022)   Facility-Administered Encounter Medications as of 07/05/2022  Medication   0.9 %  sodium chloride infusion     Review of Systems  Review of Systems  Constitutional: Negative.   HENT: Negative.     Cardiovascular: Negative.   Gastrointestinal: Negative.   Allergic/Immunologic: Negative.   Neurological: Negative.   Psychiatric/Behavioral: Negative.         Physical Exam  BP (!) 141/66   Pulse 60   Temp (!) 97.2 F (36.2 C)   Wt 154 lb 3.2 oz (69.9 kg)   SpO2 100%   BMI 31.14 kg/m   Wt Readings from Last 5 Encounters:  07/05/22 154 lb 3.2 oz (69.9 kg)  06/07/22 155 lb 12.8 oz (70.7 kg)  05/29/22 155 lb 12.8 oz (70.7 kg)  02/27/22 157 lb (71.2 kg)  01/28/22 157 lb (71.2 kg)     Physical Exam Vitals and nursing note reviewed.  Constitutional:      General: She is not in acute distress.    Appearance: She is well-developed.  Cardiovascular:     Rate and Rhythm: Normal rate and regular rhythm.  Pulmonary:     Effort: Pulmonary effort is normal.     Breath sounds: Normal breath sounds.  Neurological:     Mental Status: She is alert and oriented to person, place, and time.      Lab Results:  CBC    Component Value Date/Time   WBC 7.5 08/31/2021 1415   WBC 9.9 08/22/2016 1534   RBC 4.72 08/31/2021 1415   RBC 4.53 08/22/2016 1534   HGB 15.1 08/31/2021 1415   HCT 44.8 08/31/2021 1415   PLT 355 08/31/2021 1415   MCV 95 08/31/2021 1415   MCH 32.0 08/31/2021 1415   MCH 31.3 08/22/2016 1534   MCHC 33.7 08/31/2021 1415   MCHC 34.1 08/22/2016 1534   RDW 12.0 08/31/2021 1415   LYMPHSABS 2.8 04/30/2021 0843   MONOABS 792 08/22/2016 1534   EOSABS 0.1 04/30/2021 0843   BASOSABS 0.1 04/30/2021 0843    BMET    Component Value Date/Time   NA 140 06/07/2022 0937   K 3.3 (L) 06/07/2022 0937   CL 100 06/07/2022 0937   CO2 24 06/07/2022 0937   GLUCOSE 71 06/07/2022 0937   GLUCOSE 84 10/07/2016 1536   BUN 12 06/07/2022 0937   CREATININE 0.61 06/07/2022 0937   CREATININE 0.81 10/07/2016 1536   CALCIUM 9.1 06/07/2022 0937   GFRNONAA 96 11/05/2019 1445   GFRNONAA >89 08/22/2016 1534   GFRAA 111 11/05/2019 1445   GFRAA >89 08/22/2016 1534    BNP     Component Value Date/Time   BNP 58.0 08/22/2016 1534     Assessment & Plan:   Left upper quadrant abdominal pain Resolved  Continue Protonix   Follow up:  Follow up as scheduled     Ivonne Andrew, NP 07/05/2022

## 2022-07-05 NOTE — Patient Instructions (Addendum)
1. Left upper quadrant abdominal pain  Resolved  Continue Protonix   Follow up:  Follow up as scheduled

## 2022-07-09 ENCOUNTER — Other Ambulatory Visit: Payer: Self-pay

## 2022-07-09 DIAGNOSIS — B029 Zoster without complications: Secondary | ICD-10-CM

## 2022-07-09 NOTE — Telephone Encounter (Signed)
Please advise KH 

## 2022-07-10 ENCOUNTER — Telehealth: Payer: Self-pay

## 2022-07-10 ENCOUNTER — Other Ambulatory Visit: Payer: Self-pay | Admitting: Nurse Practitioner

## 2022-07-10 DIAGNOSIS — B029 Zoster without complications: Secondary | ICD-10-CM

## 2022-07-10 MED ORDER — GABAPENTIN 300 MG PO CAPS
300.0000 mg | ORAL_CAPSULE | Freq: Two times a day (BID) | ORAL | 0 refills | Status: AC
Start: 2022-07-10 — End: 2023-02-04

## 2022-07-11 ENCOUNTER — Other Ambulatory Visit: Payer: Self-pay

## 2022-07-13 ENCOUNTER — Ambulatory Visit
Admission: RE | Admit: 2022-07-13 | Discharge: 2022-07-13 | Disposition: A | Discharge status: Home / Self Care | Payer: Medicare HMO | Source: Ambulatory Visit | Attending: Orthopaedic Surgery | Admitting: Orthopaedic Surgery

## 2022-07-13 DIAGNOSIS — M25551 Pain in right hip: Secondary | ICD-10-CM | POA: Diagnosis not present

## 2022-07-13 DIAGNOSIS — M67959 Unspecified disorder of synovium and tendon, unspecified thigh: Secondary | ICD-10-CM

## 2022-07-13 DIAGNOSIS — K5730 Diverticulosis of large intestine without perforation or abscess without bleeding: Secondary | ICD-10-CM | POA: Diagnosis not present

## 2022-07-17 ENCOUNTER — Ambulatory Visit (INDEPENDENT_AMBULATORY_CARE_PROVIDER_SITE_OTHER): Payer: Medicare HMO | Admitting: Orthopaedic Surgery

## 2022-07-17 DIAGNOSIS — M67959 Unspecified disorder of synovium and tendon, unspecified thigh: Secondary | ICD-10-CM | POA: Diagnosis not present

## 2022-07-17 DIAGNOSIS — M25551 Pain in right hip: Secondary | ICD-10-CM

## 2022-07-17 MED ORDER — LIDOCAINE HCL 1 % IJ SOLN
4.0000 mL | INTRAMUSCULAR | Status: AC | PRN
Start: 2022-07-17 — End: 2022-07-17
  Administered 2022-07-17: 4 mL

## 2022-07-17 MED ORDER — TRIAMCINOLONE ACETONIDE 40 MG/ML IJ SUSP
80.0000 mg | INTRAMUSCULAR | Status: AC | PRN
Start: 2022-07-17 — End: 2022-07-17
  Administered 2022-07-17: 80 mg via INTRA_ARTICULAR

## 2022-07-17 NOTE — Progress Notes (Signed)
Chief Complaint: Right hip pain     History of Present Illness:   07/17/2022" today for MRI follow-up of her right hip.  She is continuing to experience pain along the lateral aspect of the hip.  Christy Price is a 52 y.o. female presents today with ongoing right hip pain which has been painful on and off since December 2023.  She has been working in physical therapy for strengthening of the right hip without any pain.  The pain is radiating from the posterior buttocks area to the right lateral trochanter and down the lateral IT band.  This is hardwood laying directly on the side.  She is having hard time getting up and down stairs.  She does have a positive Trendelenburg gait as well.  Here today with her interpreter.  She does speak send language.   Surgical History:   None  PMH/PSH/Family History/Social History/Meds/Allergies:    Past Medical History:  Diagnosis Date  . Allergy    seasonal  . Anxiety   . Arthritis    shoulder,knees,feet  . Back spasm   . Cancer (HCC)     hx cervical cx   . Depression   . GERD (gastroesophageal reflux disease)   . Hypertension   . Meningitis   . Seizures (HCC)    greater than 25 years ago   Past Surgical History:  Procedure Laterality Date  . carpal tunnel Left   . LAPAROSCOPIC HYSTERECTOMY     2000  . SHUNT REMOVAL     2007  . SHUNT REPLACEMENT     x 4   Social History   Socioeconomic History  . Marital status: Married    Spouse name: Not on file  . Number of children: Not on file  . Years of education: Not on file  . Highest education level: Not on file  Occupational History  . Not on file  Tobacco Use  . Smoking status: Every Day    Packs/day: 1.5    Types: Cigarettes    Passive exposure: Never  . Smokeless tobacco: Never  . Tobacco comments:    1 pack a week.  Vaping Use  . Vaping Use: Never used  Substance and Sexual Activity  . Alcohol use: Yes    Alcohol/week: 0.0  standard drinks of alcohol    Comment: Social  . Drug use: No  . Sexual activity: Not Currently  Other Topics Concern  . Not on file  Social History Narrative  . Not on file   Social Determinants of Health   Financial Resource Strain: Not on file  Food Insecurity: Not on file  Transportation Needs: Not on file  Physical Activity: Not on file  Stress: Not on file  Social Connections: Not on file   Family History  Problem Relation Age of Onset  . Crohn's disease Mother   . Breast cancer Sister   . Colon cancer Neg Hx   . Colon polyps Neg Hx   . Esophageal cancer Neg Hx   . Rectal cancer Neg Hx   . Stomach cancer Neg Hx   . Ulcerative colitis Neg Hx    Allergies  Allergen Reactions  . Epinephrine Other (See Comments)  . Oxycodone   . Rifampin    Current Outpatient Medications  Medication Sig Dispense Refill  .  Blood Pressure Monitor MISC Use as directed daily 1 each 0  . Cholecalciferol (D3 PO) Take by mouth. (Patient not taking: Reported on 06/07/2022)    . cyclobenzaprine (FLEXERIL) 10 MG tablet Take 1 tablet (10 mg total) by mouth 3 (three) times daily as needed for muscle spasms. (Patient not taking: Reported on 12/06/2021) 30 tablet 6  . diclofenac (VOLTAREN) 75 MG EC tablet Take 1 tablet (75 mg total) by mouth 2 (two) times daily. 30 tablet 0  . diclofenac Sodium (VOLTAREN ARTHRITIS PAIN) 1 % GEL Apply 2 g topically 4 (four) times daily. 50 g 2  . escitalopram (LEXAPRO) 10 MG tablet Take 1 tablet (10 mg total) by mouth daily. 90 tablet 0  . furosemide (LASIX) 20 MG tablet Take 1 tablet (20 mg total) by mouth daily. 90 tablet 3  . gabapentin (NEURONTIN) 300 MG capsule Take 1 capsule (300 mg total) by mouth 2 (two) times daily for 10 days. 20 capsule 0  . hydrochlorothiazide (HYDRODIURIL) 25 MG tablet Take 1 tablet (25 mg total) by mouth daily. 90 tablet 3  . hydrOXYzine (ATARAX/VISTARIL) 10 MG tablet Take 1 tablet (10 mg total) by mouth 3 (three) times daily as needed.  30 tablet 0  . lisinopril (ZESTRIL) 10 MG tablet Take 1 tablet (10 mg total) by mouth daily. (Patient not taking: Reported on 06/07/2022) 90 tablet 3  . pantoprazole (PROTONIX) 40 MG tablet Take 1 tablet (40 mg total) by mouth daily. 60 tablet 0  . predniSONE (DELTASONE) 10 MG tablet Take 1 tablet (10 mg total) by mouth daily with breakfast. (Patient not taking: Reported on 06/07/2022) 30 tablet 0  . terbinafine (LAMISIL) 250 MG tablet Take 1 tablet (250 mg total) by mouth daily. 90 tablet 2  . zolpidem (AMBIEN) 10 MG tablet Take 10 mg by mouth at bedtime as needed for sleep. (Patient not taking: Reported on 06/07/2022)     Current Facility-Administered Medications  Medication Dose Route Frequency Provider Last Rate Last Admin  . 0.9 %  sodium chloride infusion  500 mL Intravenous Once Jenel Lucks, MD       No results found.  Review of Systems:   A ROS was performed including pertinent positives and negatives as documented in the HPI.  Physical Exam :   Constitutional: NAD and appears stated age Neurological: Alert and oriented Psych: Appropriate affect and cooperative There were no vitals taken for this visit.   Comprehensive Musculoskeletal Exam:    Tenderness palpation about the right hip over the greater trochanter.  Range of motion about the right hip is 30 degrees internal/external rotation without any type of femoral acetabular pain.  There is some weakness with resisted abduction.  Positive Trendelenburg gait with standing on this leg.  Imaging:   Xray (4 views right hip): Mild femoral acetabular narrowing overall well-preserved hip  MRI right hip; There is evidence of a small gluteus medius tear at the junction of the minimus and medius although this is overall minor  I personally reviewed and interpreted the radiographs.   Assessment:   52 y.o. female with right hip pain consistent with gluteus tear.  I did describe that she overall does have a very small tear at  this fact I do believe that she would significantly benefit from injection.  I did describe that I do believe that there is a high chance of this improving her for prolonged period of time.  We did discuss that this would be a good place to  start both therapeutically and diagnostically.  I will plan to see her back in 2 months to discuss results  Plan :    -Right hip ultrasound-guided injection performed after verbal consent obtained    Procedure Note  Patient: Christy Price             Date of Birth: 10-01-1970           MRN: [id number]             Visit Date: 07/17/2022  Procedures: Visit Diagnoses: No diagnosis found.  Large Joint Inj: R greater trochanter on 07/17/2022 12:29 PM Indications: pain Details: 22 G 3.5 in needle, ultrasound-guided anterolateral approach  Arthrogram: No  Medications: 4 mL lidocaine 1 %; 80 mg triamcinolone acetonide 40 MG/ML Outcome: tolerated well, no immediate complications Procedure, treatment alternatives, risks and benefits explained, specific risks discussed. Consent was given by the patient. Immediately prior to procedure a time out was called to verify the correct patient, procedure, equipment, support staff and site/side marked as required. Patient was prepped and draped in the usual sterile fashion.          I personally saw and evaluated the patient, and participated in the management and treatment plan.  Huel Cote, MD Attending Physician, Orthopedic Surgery  This document was dictated using Dragon voice recognition software. A reasonable attempt at proof reading has been made to minimize errors.

## 2022-08-06 ENCOUNTER — Other Ambulatory Visit: Payer: Self-pay | Admitting: Nurse Practitioner

## 2022-08-06 DIAGNOSIS — Z1231 Encounter for screening mammogram for malignant neoplasm of breast: Secondary | ICD-10-CM

## 2022-08-06 NOTE — Telephone Encounter (Signed)
Medication sent in per provider. Gh 

## 2022-08-22 ENCOUNTER — Ambulatory Visit
Admission: RE | Admit: 2022-08-22 | Discharge: 2022-08-22 | Disposition: A | Discharge status: Home / Self Care | Payer: Medicare HMO | Source: Ambulatory Visit | Attending: Nurse Practitioner | Admitting: Nurse Practitioner

## 2022-08-22 DIAGNOSIS — Z1231 Encounter for screening mammogram for malignant neoplasm of breast: Secondary | ICD-10-CM | POA: Diagnosis not present

## 2022-08-29 ENCOUNTER — Ambulatory Visit: Payer: Self-pay | Admitting: Nurse Practitioner

## 2022-08-29 ENCOUNTER — Other Ambulatory Visit: Payer: Self-pay | Admitting: Nurse Practitioner

## 2022-08-29 ENCOUNTER — Telehealth: Payer: Self-pay | Admitting: Nurse Practitioner

## 2022-08-29 DIAGNOSIS — R1012 Left upper quadrant pain: Secondary | ICD-10-CM

## 2022-08-29 NOTE — Telephone Encounter (Signed)
Pt called and stated that depression medicine she doesn't like and makes feel blooded and fat. She asking if she can change it to Wellbutrin instead of the current one.?  Please let her know

## 2022-08-30 ENCOUNTER — Other Ambulatory Visit: Payer: Self-pay | Admitting: Nurse Practitioner

## 2022-08-30 MED ORDER — BUPROPION HCL ER (SR) 100 MG PO TB12: 100.0000 mg | ORAL_TABLET | Freq: Two times a day (BID) | ORAL | 2 refills | Status: DC

## 2022-09-04 ENCOUNTER — Other Ambulatory Visit (HOSPITAL_BASED_OUTPATIENT_CLINIC_OR_DEPARTMENT_OTHER): Payer: Self-pay

## 2022-09-04 ENCOUNTER — Ambulatory Visit (INDEPENDENT_AMBULATORY_CARE_PROVIDER_SITE_OTHER): Payer: Medicare HMO | Admitting: Orthopaedic Surgery

## 2022-09-04 ENCOUNTER — Other Ambulatory Visit (HOSPITAL_BASED_OUTPATIENT_CLINIC_OR_DEPARTMENT_OTHER): Payer: Self-pay | Admitting: Student

## 2022-09-04 DIAGNOSIS — M5410 Radiculopathy, site unspecified: Secondary | ICD-10-CM | POA: Diagnosis not present

## 2022-09-04 MED ORDER — METHYLPREDNISOLONE 4 MG PO TBPK
ORAL_TABLET | ORAL | 0 refills | Status: DC
  Filled 2022-09-04: qty 21, 6d supply, fill #0

## 2022-09-04 NOTE — Progress Notes (Signed)
Chief Complaint: Right hip pain     History of Present Illness:   09/04/2022 for follow-up of her right hip.  Unfortunately she did not get any relief from lateral injection.  She is continuing experience pain radiating down the leg but she does feel this can do her back.  Christy Price is a 52 y.o. female presents today with ongoing right hip pain which has been painful on and off since December 2023.  She has been working in physical therapy for strengthening of the right hip without any pain.  The pain is radiating from the posterior buttocks area to the right lateral trochanter and down the lateral IT band.  This is hardwood laying directly on the side.  She is having hard time getting up and down stairs.  She does have a positive Trendelenburg gait as well.  Here today with her interpreter.  She does speak send language.   Surgical History:   None  PMH/PSH/Family History/Social History/Meds/Allergies:    Past Medical History:  Diagnosis Date   Allergy    seasonal   Anxiety    Arthritis    shoulder,knees,feet   Back spasm    Cancer (HCC)     hx cervical cx    Depression    GERD (gastroesophageal reflux disease)    Hypertension    Meningitis    Seizures (HCC)    greater than 25 years ago   Past Surgical History:  Procedure Laterality Date   BREAST BIOPSY Left 10/30/2010   carpal tunnel Left    LAPAROSCOPIC HYSTERECTOMY     2000   SHUNT REMOVAL     2007   SHUNT REPLACEMENT     x 4   Social History   Socioeconomic History   Marital status: Married    Spouse name: Not on file   Number of children: Not on file   Years of education: Not on file   Highest education level: Not on file  Occupational History   Not on file  Tobacco Use   Smoking status: Every Day    Packs/day: 1.5    Types: Cigarettes    Passive exposure: Never   Smokeless tobacco: Never   Tobacco comments:    1 pack a week.  Vaping Use   Vaping Use:  Never used  Substance and Sexual Activity   Alcohol use: Yes    Alcohol/week: 0.0 standard drinks of alcohol    Comment: Social   Drug use: No   Sexual activity: Not Currently  Other Topics Concern   Not on file  Social History Narrative   Not on file   Social Determinants of Health   Financial Resource Strain: Not on file  Food Insecurity: Not on file  Transportation Needs: Not on file  Physical Activity: Not on file  Stress: Not on file  Social Connections: Not on file   Family History  Problem Relation Age of Onset   Crohn's disease Mother    Breast cancer Sister    Colon cancer Neg Hx    Colon polyps Neg Hx    Esophageal cancer Neg Hx    Rectal cancer Neg Hx    Stomach cancer Neg Hx    Ulcerative colitis Neg Hx    Allergies  Allergen Reactions   Epinephrine Other (See Comments)  Oxycodone    Rifampin    Current Outpatient Medications  Medication Sig Dispense Refill   methylPREDNISolone (MEDROL DOSEPAK) 4 MG TBPK tablet Take per packet instructions 21 each 0   Blood Pressure Monitor MISC Use as directed daily 1 each 0   buPROPion ER (WELLBUTRIN SR) 100 MG 12 hr tablet Take 1 tablet (100 mg total) by mouth 2 (two) times daily. 60 tablet 2   Cholecalciferol (D3 PO) Take by mouth. (Patient not taking: Reported on 06/07/2022)     cyclobenzaprine (FLEXERIL) 10 MG tablet Take 1 tablet (10 mg total) by mouth 3 (three) times daily as needed for muscle spasms. (Patient not taking: Reported on 12/06/2021) 30 tablet 6   diclofenac (VOLTAREN) 75 MG EC tablet Take 1 tablet (75 mg total) by mouth 2 (two) times daily. 30 tablet 0   diclofenac Sodium (VOLTAREN ARTHRITIS PAIN) 1 % GEL Apply 2 g topically 4 (four) times daily. 50 g 2   escitalopram (LEXAPRO) 10 MG tablet Take 1 tablet (10 mg total) by mouth daily. 90 tablet 0   furosemide (LASIX) 20 MG tablet Take 1 tablet (20 mg total) by mouth daily. 90 tablet 3   gabapentin (NEURONTIN) 300 MG capsule Take 1 capsule (300 mg total)  by mouth 2 (two) times daily for 10 days. 20 capsule 0   hydrochlorothiazide (HYDRODIURIL) 25 MG tablet Take 1 tablet (25 mg total) by mouth daily. 90 tablet 3   hydrOXYzine (ATARAX/VISTARIL) 10 MG tablet Take 1 tablet (10 mg total) by mouth 3 (three) times daily as needed. 30 tablet 0   lisinopril (ZESTRIL) 10 MG tablet Take 1 tablet (10 mg total) by mouth daily. (Patient not taking: Reported on 06/07/2022) 90 tablet 3   pantoprazole (PROTONIX) 40 MG tablet TAKE 1 TABLET EVERY DAY 60 tablet 5   predniSONE (DELTASONE) 10 MG tablet Take 1 tablet (10 mg total) by mouth daily with breakfast. (Patient not taking: Reported on 06/07/2022) 30 tablet 0   terbinafine (LAMISIL) 250 MG tablet Take 1 tablet (250 mg total) by mouth daily. 90 tablet 2   zolpidem (AMBIEN) 10 MG tablet Take 10 mg by mouth at bedtime as needed for sleep. (Patient not taking: Reported on 06/07/2022)     Current Facility-Administered Medications  Medication Dose Route Frequency Provider Last Rate Last Admin   0.9 %  sodium chloride infusion  500 mL Intravenous Once Jenel Lucks, MD       No results found.  Review of Systems:   A ROS was performed including pertinent positives and negatives as documented in the HPI.  Physical Exam :   Constitutional: NAD and appears stated age Neurological: Alert and oriented Psych: Appropriate affect and cooperative There were no vitals taken for this visit.   Comprehensive Musculoskeletal Exam:    Tenderness palpation about the right hip over the greater trochanter.  Range of motion about the right hip is 30 degrees internal/external rotation without any type of femoral acetabular pain.  There is some weakness with resisted abduction.  Positive Trendelenburg gait with standing on this leg.  Positive straight leg raise on the right  Imaging:   Xray (4 views right hip): Mild femoral acetabular narrowing overall well-preserved hip  MRI right hip; There is evidence of a small  gluteus medius tear at the junction of the minimus and medius although this is overall minor  I personally reviewed and interpreted the radiographs.   Assessment:   52 y.o. female with right lateral hip pain  which is wrapping around the thigh.  I did discuss that given the fact that she did not get any relief from her injection that at this point I would recommend an MRI of her lumbar spine.  I do believe that this may also be a presentation of a lumbar radiculitis.  Will plan for MRI of her lumbar spine and follow-up  Plan :    -Right lumbar spine follow-up to discuss results     I personally saw and evaluated the patient, and participated in the management and treatment plan.  Huel Cote, MD Attending Physician, Orthopedic Surgery  This document was dictated using Dragon voice recognition software. A reasonable attempt at proof reading has been made to minimize errors.

## 2022-09-13 ENCOUNTER — Telehealth: Payer: Self-pay | Admitting: Orthopaedic Surgery

## 2022-09-13 NOTE — Telephone Encounter (Signed)
Pt called requesting a call about her MRI and it was cancelled. Pt is trying to figure out why they cancelled. Please call pt at [phone].

## 2022-09-16 ENCOUNTER — Other Ambulatory Visit: Payer: Medicare HMO

## 2022-09-17 ENCOUNTER — Inpatient Hospital Stay: Admission: RE | Admit: 2022-09-17 | Payer: Medicare HMO | Source: Ambulatory Visit

## 2022-09-19 ENCOUNTER — Other Ambulatory Visit: Payer: Self-pay | Admitting: Nurse Practitioner

## 2022-09-20 ENCOUNTER — Ambulatory Visit (INDEPENDENT_AMBULATORY_CARE_PROVIDER_SITE_OTHER): Payer: Medicare HMO | Admitting: Nurse Practitioner

## 2022-09-20 ENCOUNTER — Encounter: Payer: Self-pay | Admitting: Nurse Practitioner

## 2022-09-20 VITALS — BP 163/74 | HR 60 | Temp 97.5°F | Ht 59.0 in | Wt 156.8 lb

## 2022-09-20 DIAGNOSIS — I10 Essential (primary) hypertension: Secondary | ICD-10-CM

## 2022-09-20 DIAGNOSIS — Z13220 Encounter for screening for lipoid disorders: Secondary | ICD-10-CM | POA: Diagnosis not present

## 2022-09-20 DIAGNOSIS — M25551 Pain in right hip: Secondary | ICD-10-CM

## 2022-09-20 MED ORDER — KETOROLAC TROMETHAMINE 30 MG/ML IJ SOLN
30.0000 mg | Freq: Once | INTRAMUSCULAR | Status: AC
Start: 2022-09-20 — End: 2022-09-20
  Administered 2022-09-20: 30 mg via INTRAMUSCULAR

## 2022-09-20 NOTE — Patient Instructions (Signed)
1. Lipid screening  - Lipid Panel  2. Pain of right hip  - ketorolac (TORADOL) 30 MG/ML injection 30 mg  3. Essential hypertension  - CBC - Comprehensive metabolic panel  Follow up:  Follow up in 6 months

## 2022-09-20 NOTE — Progress Notes (Signed)
@Patient  ID: Christy Price, female    DOB: Oct 02, 1970, 52 y.o.   MRN: [id number]  Chief Complaint  Patient presents with   Follow-up    Referring provider: Ivonne Andrew, NP   HPI  Christy Price presents for follow up. She  has a past medical history of Cancer (HCC), Hypertension, and Meningitis.      Patient presents today for follow-up visit. Patient does have a sign language interpreter here today.  Patient continues to complain today of right hip pain.  She has been followed by orthopedics and was scheduled for an MRI.  She was unable to get the MRI completed due to no interpreter.  The nurse must call and reschedule this for her and request in person interpreter.  Patient also has a bruise to her right elbow.  She states that it does not hurt.  We discussed that it could take 6 to 8 weeks for this area to clear up.  Patient does not remember any injury to that arm.  denies f/c/s, n/v/d, hemoptysis, PND, chest pain or edema.      Allergies  Allergen Reactions   Epinephrine Other (See Comments)   Oxycodone    Rifampin     Immunization History  Administered Date(s) Administered   Influenza,inj,Quad PF,6+ Mos 11/04/2018, 12/28/2019   PFIZER(Purple Top)SARS-COV-2 Vaccination 06/10/2019, 07/06/2019, 04/07/2020   Tdap 04/09/2017    Past Medical History:  Diagnosis Date   Allergy    seasonal   Anxiety    Arthritis    shoulder,knees,feet   Back spasm    Cancer (HCC)     hx cervical cx    Depression    GERD (gastroesophageal reflux disease)    Hypertension    Meningitis    Seizures (HCC)    greater than 25 years ago    Tobacco History: Social History   Tobacco Use  Smoking Status Every Day   Current packs/day: 1.50   Types: Cigarettes   Passive exposure: Never  Smokeless Tobacco Never  Tobacco Comments   1 pack a week.   Ready to quit: Not Answered Counseling given: Not Answered Tobacco comments: 1 pack a week.   Outpatient Encounter  Medications as of 09/20/2022  Medication Sig   buPROPion ER (WELLBUTRIN SR) 100 MG 12 hr tablet Take 1 tablet (100 mg total) by mouth 2 (two) times daily.   Cholecalciferol (D3 PO) Take by mouth.   cyclobenzaprine (FLEXERIL) 10 MG tablet Take 1 tablet (10 mg total) by mouth 3 (three) times daily as needed for muscle spasms.   furosemide (LASIX) 20 MG tablet Take 1 tablet (20 mg total) by mouth daily.   gabapentin (NEURONTIN) 300 MG capsule Take 1 capsule (300 mg total) by mouth 2 (two) times daily for 10 days.   hydrochlorothiazide (HYDRODIURIL) 25 MG tablet Take 1 tablet (25 mg total) by mouth daily.   pantoprazole (PROTONIX) 40 MG tablet TAKE 1 TABLET EVERY DAY   terbinafine (LAMISIL) 250 MG tablet Take 1 tablet (250 mg total) by mouth daily.   [DISCONTINUED] diclofenac Sodium (VOLTAREN ARTHRITIS PAIN) 1 % GEL Apply 2 g topically 4 (four) times daily.   Blood Pressure Monitor MISC Use as directed daily (Patient not taking: Reported on 09/20/2022)   escitalopram (LEXAPRO) 10 MG tablet TAKE 1 TABLET EVERY DAY (Patient not taking: Reported on 09/20/2022)   hydrOXYzine (ATARAX/VISTARIL) 10 MG tablet Take 1 tablet (10 mg total) by mouth 3 (three) times daily as needed. (Patient not taking: Reported  on 09/20/2022)   lisinopril (ZESTRIL) 10 MG tablet Take 1 tablet (10 mg total) by mouth daily. (Patient not taking: Reported on 06/07/2022)   methylPREDNISolone (MEDROL DOSEPAK) 4 MG TBPK tablet Take per packet instructions (Patient not taking: Reported on 09/20/2022)   predniSONE (DELTASONE) 10 MG tablet Take 1 tablet (10 mg total) by mouth daily with breakfast. (Patient not taking: Reported on 06/07/2022)   zolpidem (AMBIEN) 10 MG tablet Take 10 mg by mouth at bedtime as needed for sleep. (Patient not taking: Reported on 06/07/2022)   [DISCONTINUED] diclofenac (VOLTAREN) 75 MG EC tablet Take 1 tablet (75 mg total) by mouth 2 (two) times daily. (Patient not taking: Reported on 09/20/2022)   Facility-Administered  Encounter Medications as of 09/20/2022  Medication   0.9 %  sodium chloride infusion   [COMPLETED] ketorolac (TORADOL) 30 MG/ML injection 30 mg     Review of Systems  Review of Systems  Constitutional: Negative.   HENT: Negative.    Cardiovascular: Negative.   Gastrointestinal: Negative.   Allergic/Immunologic: Negative.   Neurological: Negative.   Psychiatric/Behavioral: Negative.         Physical Exam  BP (!) 163/74   Pulse 60   Temp (!) 97.5 F (36.4 C)   Ht 4\' 11"  (1.499 m)   Wt 156 lb 12.8 oz (71.1 kg)   SpO2 100%   BMI 31.67 kg/m   Wt Readings from Last 5 Encounters:  09/20/22 156 lb 12.8 oz (71.1 kg)  07/05/22 154 lb 3.2 oz (69.9 kg)  06/07/22 155 lb 12.8 oz (70.7 kg)  05/29/22 155 lb 12.8 oz (70.7 kg)  02/27/22 157 lb (71.2 kg)     Physical Exam Vitals and nursing note reviewed.  Constitutional:      General: She is not in acute distress.    Appearance: She is well-developed.  Cardiovascular:     Rate and Rhythm: Normal rate and regular rhythm.  Pulmonary:     Effort: Pulmonary effort is normal.     Breath sounds: Normal breath sounds.  Neurological:     Mental Status: She is alert and oriented to person, place, and time.      Lab Results:  CBC    Component Value Date/Time   WBC 7.5 08/31/2021 1415   WBC 9.9 08/22/2016 1534   RBC 4.72 08/31/2021 1415   RBC 4.53 08/22/2016 1534   HGB 15.1 08/31/2021 1415   HCT 44.8 08/31/2021 1415   PLT 355 08/31/2021 1415   MCV 95 08/31/2021 1415   MCH 32.0 08/31/2021 1415   MCH 31.3 08/22/2016 1534   MCHC 33.7 08/31/2021 1415   MCHC 34.1 08/22/2016 1534   RDW 12.0 08/31/2021 1415   LYMPHSABS 2.8 04/30/2021 0843   MONOABS 792 08/22/2016 1534   EOSABS 0.1 04/30/2021 0843   BASOSABS 0.1 04/30/2021 0843    BMET    Component Value Date/Time   NA 140 06/07/2022 0937   K 3.3 (L) 06/07/2022 0937   CL 100 06/07/2022 0937   CO2 24 06/07/2022 0937   GLUCOSE 71 06/07/2022 0937   GLUCOSE 84  10/07/2016 1536   BUN 12 06/07/2022 0937   CREATININE 0.61 06/07/2022 0937   CREATININE 0.81 10/07/2016 1536   CALCIUM 9.1 06/07/2022 0937   GFRNONAA 96 11/05/2019 1445   GFRNONAA >89 08/22/2016 1534   GFRAA 111 11/05/2019 1445   GFRAA >89 08/22/2016 1534    BNP    Component Value Date/Time   BNP 58.0 08/22/2016 1534    ProBNP  No results found for: "PROBNP"  Imaging: MM 3D SCREENING MAMMOGRAM BILATERAL BREAST  Result Date: 08/26/2022 CLINICAL DATA:  Screening. EXAM: DIGITAL SCREENING BILATERAL MAMMOGRAM WITH TOMOSYNTHESIS AND CAD TECHNIQUE: Bilateral screening digital craniocaudal and mediolateral oblique mammograms were obtained. Bilateral screening digital breast tomosynthesis was performed. The images were evaluated with computer-aided detection. COMPARISON:  Previous exam(s). ACR Breast Density Category b: There are scattered areas of fibroglandular density. FINDINGS: There are no findings suspicious for malignancy. IMPRESSION: No mammographic evidence of malignancy. A result letter of this screening mammogram will be mailed directly to the patient. RECOMMENDATION: Screening mammogram in one year. (Code:SM-B-01Y) BI-RADS CATEGORY  1: Negative. Electronically Signed   By: Emmaline Kluver M.D.   On: 08/26/2022 14:16     Assessment & Plan:   Lipid screening - Lipid Panel  2. Pain of right hip  - ketorolac (TORADOL) 30 MG/ML injection 30 mg  3. Essential hypertension  - CBC - Comprehensive metabolic panel  Follow up:  Follow up in 6 months     Ivonne Andrew, NP 09/20/2022

## 2022-09-20 NOTE — Assessment & Plan Note (Signed)
-   Lipid Panel  2. Pain of right hip  - ketorolac (TORADOL) 30 MG/ML injection 30 mg  3. Essential hypertension  - CBC - Comprehensive metabolic panel  Follow up:  Follow up in 6 months

## 2022-09-21 ENCOUNTER — Other Ambulatory Visit: Payer: Self-pay | Admitting: Nurse Practitioner

## 2022-09-21 LAB — CBC
Hematocrit: 45.5 % (ref 34.0–46.6)
Hemoglobin: 14.7 g/dL (ref 11.1–15.9)
MCH: 31.2 pg (ref 26.6–33.0)
MCHC: 32.3 g/dL (ref 31.5–35.7)
MCV: 97 fL (ref 79–97)
Platelets: 312 10*3/uL (ref 150–450)
RBC: 4.71 x10E6/uL (ref 3.77–5.28)
RDW: 12.4 % (ref 11.7–15.4)
WBC: 8.8 10*3/uL (ref 3.4–10.8)

## 2022-09-21 LAB — COMPREHENSIVE METABOLIC PANEL
ALT: 9 IU/L (ref 0–32)
AST: 13 IU/L (ref 0–40)
Albumin: 4.4 g/dL (ref 3.8–4.9)
Alkaline Phosphatase: 75 IU/L (ref 44–121)
BUN/Creatinine Ratio: 19 (ref 9–23)
BUN: 13 mg/dL (ref 6–24)
Bilirubin Total: <0.2 mg/dL (ref 0.0–1.2)
CO2: 25 mmol/L (ref 20–29)
Calcium: 9.6 mg/dL (ref 8.7–10.2)
Chloride: 100 mmol/L (ref 96–106)
Creatinine, Ser: 0.69 mg/dL (ref 0.57–1.00)
Globulin, Total: 2.4 g/dL (ref 1.5–4.5)
Glucose: 61 mg/dL — ABNORMAL LOW (ref 70–99)
Potassium: 3.7 mmol/L (ref 3.5–5.2)
Sodium: 140 mmol/L (ref 134–144)
Total Protein: 6.8 g/dL (ref 6.0–8.5)
eGFR: 104 mL/min/{1.73_m2} (ref >59)

## 2022-09-21 LAB — LIPID PANEL
Chol/HDL Ratio: 2.7 ratio (ref 0.0–4.4)
Cholesterol, Total: 205 mg/dL — ABNORMAL HIGH (ref 100–199)
HDL: 76 mg/dL (ref >39)
LDL Chol Calc (NIH): 116 mg/dL — ABNORMAL HIGH (ref 0–99)
Triglycerides: 71 mg/dL (ref 0–149)
VLDL Cholesterol Cal: 13 mg/dL (ref 5–40)

## 2022-09-23 DIAGNOSIS — H04123 Dry eye syndrome of bilateral lacrimal glands: Secondary | ICD-10-CM | POA: Diagnosis not present

## 2022-09-23 NOTE — Telephone Encounter (Signed)
Please advise in Tonya absence. 

## 2022-09-25 ENCOUNTER — Ambulatory Visit (HOSPITAL_BASED_OUTPATIENT_CLINIC_OR_DEPARTMENT_OTHER): Payer: Medicare HMO | Admitting: Orthopaedic Surgery

## 2022-10-09 ENCOUNTER — Ambulatory Visit (HOSPITAL_BASED_OUTPATIENT_CLINIC_OR_DEPARTMENT_OTHER): Payer: Medicare HMO | Admitting: Orthopaedic Surgery

## 2022-10-10 ENCOUNTER — Ambulatory Visit
Admission: RE | Admit: 2022-10-10 | Discharge: 2022-10-10 | Disposition: A | Discharge status: Home / Self Care | Payer: Medicare HMO | Source: Ambulatory Visit | Attending: Orthopaedic Surgery | Admitting: Orthopaedic Surgery

## 2022-10-10 DIAGNOSIS — M47817 Spondylosis without myelopathy or radiculopathy, lumbosacral region: Secondary | ICD-10-CM | POA: Diagnosis not present

## 2022-10-10 DIAGNOSIS — M47816 Spondylosis without myelopathy or radiculopathy, lumbar region: Secondary | ICD-10-CM | POA: Diagnosis not present

## 2022-10-10 DIAGNOSIS — M5136 Other intervertebral disc degeneration, lumbar region: Secondary | ICD-10-CM | POA: Diagnosis not present

## 2022-10-10 DIAGNOSIS — M5410 Radiculopathy, site unspecified: Secondary | ICD-10-CM

## 2022-10-11 ENCOUNTER — Other Ambulatory Visit (HOSPITAL_COMMUNITY): Payer: Self-pay

## 2022-10-16 ENCOUNTER — Ambulatory Visit (INDEPENDENT_AMBULATORY_CARE_PROVIDER_SITE_OTHER): Payer: Medicare HMO | Admitting: Orthopaedic Surgery

## 2022-10-16 DIAGNOSIS — M5410 Radiculopathy, site unspecified: Secondary | ICD-10-CM

## 2022-10-16 NOTE — Progress Notes (Signed)
Chief Complaint: Right hip pain     History of Present Illness:   10/16/2022 for follow-up of her right hip.  Her MRI of her lumbar spine was recently done and she is here today for further discussion.  Christy Price is a 52 y.o. female presents today with ongoing right hip pain which has been painful on and off since December 2023.  She has been working in physical therapy for strengthening of the right hip without any pain.  The pain is radiating from the posterior buttocks area to the right lateral trochanter and down the lateral IT band.  This is hardwood laying directly on the side.  She is having hard time getting up and down stairs.  She does have a positive Trendelenburg gait as well.  Here today with her interpreter.  She does speak send language.   Surgical History:   None  PMH/PSH/Family History/Social History/Meds/Allergies:    Past Medical History:  Diagnosis Date   Allergy    seasonal   Anxiety    Arthritis    shoulder,knees,feet   Back spasm    Cancer (HCC)     hx cervical cx    Depression    GERD (gastroesophageal reflux disease)    Hypertension    Meningitis    Seizures (HCC)    greater than 25 years ago   Past Surgical History:  Procedure Laterality Date   BREAST BIOPSY Left 10/30/2010   carpal tunnel Left    LAPAROSCOPIC HYSTERECTOMY     2000   SHUNT REMOVAL     2007   SHUNT REPLACEMENT     x 4   Social History   Socioeconomic History   Marital status: Married    Spouse name: Not on file   Number of children: Not on file   Years of education: Not on file   Highest education level: Not on file  Occupational History   Not on file  Tobacco Use   Smoking status: Every Day    Current packs/day: 1.50    Types: Cigarettes    Passive exposure: Never   Smokeless tobacco: Never   Tobacco comments:    1 pack a week.  Vaping Use   Vaping status: Never Used  Substance and Sexual Activity   Alcohol use: Yes     Alcohol/week: 0.0 standard drinks of alcohol    Comment: Social   Drug use: No   Sexual activity: Not Currently  Other Topics Concern   Not on file  Social History Narrative   Not on file   Social Determinants of Health   Financial Resource Strain: Not on file  Food Insecurity: Not on file  Transportation Needs: Not on file  Physical Activity: Not on file  Stress: Not on file  Social Connections: Not on file   Family History  Problem Relation Age of Onset   Crohn's disease Mother    Breast cancer Sister    Colon cancer Neg Hx    Colon polyps Neg Hx    Esophageal cancer Neg Hx    Rectal cancer Neg Hx    Stomach cancer Neg Hx    Ulcerative colitis Neg Hx    Allergies  Allergen Reactions   Epinephrine Other (See Comments)   Oxycodone    Rifampin    Current  Outpatient Medications  Medication Sig Dispense Refill   Blood Pressure Monitor MISC Use as directed daily (Patient not taking: Reported on 09/20/2022) 1 each 0   buPROPion ER (WELLBUTRIN SR) 100 MG 12 hr tablet TAKE 1 TABLET BY MOUTH TWICE A DAY 180 tablet 1   Cholecalciferol (D3 PO) Take by mouth.     cyclobenzaprine (FLEXERIL) 10 MG tablet Take 1 tablet (10 mg total) by mouth 3 (three) times daily as needed for muscle spasms. 30 tablet 6   escitalopram (LEXAPRO) 10 MG tablet TAKE 1 TABLET EVERY DAY (Patient not taking: Reported on 09/20/2022) 90 tablet 3   furosemide (LASIX) 20 MG tablet Take 1 tablet (20 mg total) by mouth daily. 90 tablet 3   gabapentin (NEURONTIN) 300 MG capsule Take 1 capsule (300 mg total) by mouth 2 (two) times daily for 10 days. 20 capsule 0   hydrochlorothiazide (HYDRODIURIL) 25 MG tablet Take 1 tablet (25 mg total) by mouth daily. 90 tablet 3   hydrOXYzine (ATARAX/VISTARIL) 10 MG tablet Take 1 tablet (10 mg total) by mouth 3 (three) times daily as needed. (Patient not taking: Reported on 09/20/2022) 30 tablet 0   lisinopril (ZESTRIL) 10 MG tablet Take 1 tablet (10 mg total) by mouth daily.  (Patient not taking: Reported on 06/07/2022) 90 tablet 3   methylPREDNISolone (MEDROL DOSEPAK) 4 MG TBPK tablet Take per packet instructions (Patient not taking: Reported on 09/20/2022) 21 each 0   pantoprazole (PROTONIX) 40 MG tablet TAKE 1 TABLET EVERY DAY 60 tablet 5   predniSONE (DELTASONE) 10 MG tablet Take 1 tablet (10 mg total) by mouth daily with breakfast. (Patient not taking: Reported on 06/07/2022) 30 tablet 0   terbinafine (LAMISIL) 250 MG tablet Take 1 tablet (250 mg total) by mouth daily. 90 tablet 2   zolpidem (AMBIEN) 10 MG tablet Take 10 mg by mouth at bedtime as needed for sleep. (Patient not taking: Reported on 06/07/2022)     Current Facility-Administered Medications  Medication Dose Route Frequency Provider Last Rate Last Admin   0.9 %  sodium chloride infusion  500 mL Intravenous Once Jenel Lucks, MD       No results found.  Review of Systems:   A ROS was performed including pertinent positives and negatives as documented in the HPI.  Physical Exam :   Constitutional: NAD and appears stated age Neurological: Alert and oriented Psych: Appropriate affect and cooperative There were no vitals taken for this visit.   Comprehensive Musculoskeletal Exam:    Tenderness palpation about the right hip over the greater trochanter.  Range of motion about the right hip is 30 degrees internal/external rotation without any type of femoral acetabular pain.  There is some weakness with resisted abduction.  Positive Trendelenburg gait with standing on this leg.  Positive straight leg raise on the right  Imaging:   Xray (4 views right hip): Mild femoral acetabular narrowing overall well-preserved hip  MRI right hip; There is evidence of a small gluteus medius tear at the junction of the minimus and medius although this is overall minor  I personally reviewed and interpreted the radiographs.   Assessment:   52 y.o. female with right lateral hip pain which is wrapping  around the thigh.  At this point I did describe that her MRI findings of the hip and lumbar spine are relatively normal.  She did not get any relief from the gluteus medius injection.  Today she is extremely tender over the right SI joint  and sciatic believe that she would benefit from an SI injection Dr. Shon Baton.  I do also believe that there is a component of ischiofemoral impingement that may benefit from a 2nd nerve neurolysis, would like Dr. Shon Baton to further evaluate this.  Plan :    -For referral to Dr. Shon Baton for right SI injection and possible sciatic nerve lysis    I personally saw and evaluated the patient, and participated in the management and treatment plan.  Huel Cote, MD Attending Physician, Orthopedic Surgery  This document was dictated using Dragon voice recognition software. A reasonable attempt at proof reading has been made to minimize errors.

## 2022-10-24 ENCOUNTER — Ambulatory Visit: Payer: Medicare HMO | Admitting: Sports Medicine

## 2022-10-28 ENCOUNTER — Ambulatory Visit: Payer: Medicare HMO | Admitting: Sports Medicine

## 2022-10-28 ENCOUNTER — Encounter: Payer: Self-pay | Admitting: Sports Medicine

## 2022-10-28 ENCOUNTER — Other Ambulatory Visit: Payer: Self-pay

## 2022-10-28 DIAGNOSIS — M79604 Pain in right leg: Secondary | ICD-10-CM

## 2022-10-28 DIAGNOSIS — M533 Sacrococcygeal disorders, not elsewhere classified: Secondary | ICD-10-CM

## 2022-10-28 DIAGNOSIS — M5410 Radiculopathy, site unspecified: Secondary | ICD-10-CM | POA: Diagnosis not present

## 2022-10-28 DIAGNOSIS — G8929 Other chronic pain: Secondary | ICD-10-CM | POA: Diagnosis not present

## 2022-10-28 NOTE — Progress Notes (Signed)
Christy Price - 52 y.o. female MRN [id number]  Date of birth: 04/12/1970  Office Visit Note: Visit Date: 10/28/2022 PCP: Ivonne Andrew, NP Referred by: Huel Cote, MD  Subjective: No chief complaint on file.  HPI: Christy Price is a pleasant 52 y.o. female who presents today for acute on chronic right low back pain.  The use of an iPad sign language interpreter was used throughout the entirety of the visit today.  She has had chronic right hip pain and posterior back pain since December 2023.  She has had an MRI of the hip which showed a gluteus minimus tear as well as MRI of the lumbar spine which was relatively benign.  She has been doing formalized physical therapy to strengthen the right hip.  Has difficulty going up and down stairs.  She points to the pain right overlying the right SI joint.  At times her pain will go down the leg sometimes the posterior and lateral side.  Has taken methylprednisolone taper, also on gabapentin 300 mg past.  Pertinent ROS were reviewed with the patient and found to be negative unless otherwise specified above in HPI.   Assessment & Plan: Visit Diagnoses:  1. Chronic right SI joint pain   2. Pain in right leg   3. Radicular pain of right lower extremity    Plan: Discussed with Christy Price treatment options for her right SI joint and back/leg pain.  Did review her lumbar and hip MRI which are relatively benign.  At this point most of her pain is emanating from the SI joint, through shared decision making to proceed with ultrasound-guided right SI joint injection.  Will allow for 48 hours of modified rest and activity.  She may ice and use Tylenol for any postinjection pain.  Provide evaluation and Dr. Serena Croissant evaluation, she could have a degree of ischiofemoral impingement as well.  She will notify me in 2 weeks the degree of improvement from her SI joint pain.  If she is less than 70% improved from this injection, we could consider  reevaluation and/or ultrasound-guided ischiofemoral impingement injection.  Continue PT/HEP.  Follow-up: Return for she will message me in 2 weeks to let me know of her improvement.   Meds & Orders: No orders of the defined types were placed in this encounter.   Orders Placed This Encounter  Procedures   US Guided Needle Placement - No Linked Charges     Procedures: U/S-guided SI-joint injection, right   After discussion of risk/benefits/indications, informed verbal consent was obtained. A timeout was then performed. The patient was positioned in a prone position on exam room table with a pillow placed under the pelvis for mild hip flexion. The SI joint area was cleaned and prepped with betadine and alcohol swabs. Sterile ultrasound gel was applied and the ultrasound transducer was placed in an anatomic axial plane over the PSIS, then moved distally over the SI-joint. Using ultrasound guidance, a 22-gauge, 3.5" needle was inserted from a medial to lateral approach utilizing an in-plane approach and directed into the SI-joint. The SI-joint was then injected with a mixture of 4:2 lidocaine:depomedrol with visualization of the injectate flow into the SI-joint under ultrasound visualization. The patient tolerated the procedure well without immediate complications.       Clinical History: No specialty comments available.  She reports that she has been smoking cigarettes. She has never been exposed to tobacco smoke. She has never used smokeless tobacco. No results for input(s): "HGBA1C", "LABURIC"  in the last 8760 hours.  Objective:   Vital Signs: There were no vitals taken for this visit.  Physical Exam  Gen: Well-appearing, in no acute distress; non-toxic CV:  Well-perfused. Warm.  Resp: Breathing unlabored on room air; no wheezing.  Ortho Exam - Lumbar/SI Jt: + TTP over the right SI-joint, to a lesser degree in the mid-buttock/piriformis.  Positive Fortin's point test.  No overlying  redness or swelling.  Imaging:  MR Lumbar Spine Wo Contrast CLINICAL DATA:  Low back and right hip pain for 5 months.  EXAM: MRI LUMBAR SPINE WITHOUT CONTRAST  TECHNIQUE: Multiplanar, multisequence MR imaging of the lumbar spine was performed. No intravenous contrast was administered.  COMPARISON:  CT abdomen and pelvis 09/16/2007. Plain films lumbar spine 05/21/2022.  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 On this exam, the lowest level imaged in the axial plane as S1-2. Numbering scheme is based on correlation with prior CT and plain films.  Alignment:  Normal.  Vertebrae:  No fracture, evidence of discitis, or bone lesion.  Conus medullaris and cauda equina: Conus extends to the L2 level. Conus and cauda equina appear normal.  Paraspinal and other soft tissues: Negative.  Disc levels:  T9-10, T10-11 and T11-12 are imaged in the sagittal plane only and negative.  T12-L1: Negative.  L1-2: Negative.  L2-3: Negative.  L3-4: Negative.  L4-5: Minimal disc bulge and mild facet arthropathy.  No stenosis.  L5-S1: Moderate facet degenerative disease is worse on the left. There is a shallow disc bulge but the central canal and foramina are open.  S1-2: Transitional segment.  Negative.  IMPRESSION: 1. Transitional lumbosacral anatomy as described above. 2. Moderate facet degenerative disease at L5-S1 is worse on the left. The central canal and foramina are open at this level. 3. Minimal disc bulge and mild facet arthropathy at L4-5 without stenosis.  Electronically Signed   By: Drusilla Kanner M.D.   On: 10/20/2022 09:37    Past Medical/Family/Surgical/Social History: Medications & Allergies reviewed per EMR, new medications updated. 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   Peripheral edema 11/04/2018   Essential hypertension 11/04/2018   Rash 11/04/2018   Pain in both lower legs 12/12/2016   Blepharitis of upper and lower eyelids of both eyes 09/24/2016   Keratoconjunctivitis sicca of both eyes not specified as Sjogren's 09/24/2016   Presbyopia of both eyes 09/24/2016   Vitreous floater, bilateral 09/24/2016   Partial nontraumatic tear of right rotator cuff 04/03/2016   Nail fungus 03/30/2015   Toe pain 03/30/2015   Insomnia 12/19/2014   Pedal edema 11/06/2014   Depression 11/06/2014   Genital herpes 11/06/2014   Back pain 11/06/2014   Past Medical History:  Diagnosis Date   Allergy    seasonal   Anxiety    Arthritis    shoulder,knees,feet   Back spasm    Cancer (HCC)     hx cervical cx    Depression    GERD (gastroesophageal reflux disease)    Hypertension    Meningitis    Seizures (HCC)    greater than 25 years ago   Family History  Problem Relation Age of Onset  Crohn's disease Mother    Breast cancer Sister    Colon cancer Neg Hx    Colon polyps Neg Hx    Esophageal cancer Neg Hx    Rectal cancer Neg Hx    Stomach cancer Neg Hx    Ulcerative colitis Neg Hx    Past Surgical History:  Procedure Laterality Date   BREAST BIOPSY Left 10/30/2010   carpal tunnel Left    LAPAROSCOPIC HYSTERECTOMY     2000   SHUNT REMOVAL     2007   SHUNT REPLACEMENT     x 4   Social History   Occupational History   Not on file  Tobacco Use   Smoking status: Every Day    Current packs/day: 1.50    Types: Cigarettes    Passive exposure: Never   Smokeless tobacco: Never   Tobacco comments:    1 pack a week.  Vaping Use   Vaping status: Never Used  Substance and Sexual Activity   Alcohol use: Yes    Alcohol/week: 0.0 standard drinks of alcohol    Comment: Social    Drug use: No   Sexual activity: Not Currently   I spent 38 minutes in the care of the patient today including face-to-face time, preparation to see the patient, as well as review of right hip MRI, lumbar spine MRI, discussion on diagnostic and therapeutic SI joint and possible ischiofemoral injection, time spent with sign language interpretation for the above diagnoses.   Madelyn Brunner, DO Primary Care Sports Medicine Physician  [facility] - Orthopedics  This note was dictated using Dragon naturally speaking software and may contain errors in syntax, spelling, or content which have not been identified prior to signing this note.

## 2022-10-28 NOTE — Progress Notes (Signed)
Dr. Steward Drone sent you for a SI joint inj with Dr.Brooks are you okay with getting this inj today ?  Sorry the interpreter machine isnt working the best today.

## 2022-11-20 ENCOUNTER — Encounter: Payer: Self-pay | Admitting: Nurse Practitioner

## 2022-11-20 ENCOUNTER — Telehealth: Payer: Self-pay

## 2022-11-20 ENCOUNTER — Ambulatory Visit (INDEPENDENT_AMBULATORY_CARE_PROVIDER_SITE_OTHER): Payer: Medicare HMO | Admitting: Nurse Practitioner

## 2022-11-20 VITALS — BP 148/92 | HR 68 | Temp 98.2°F | Resp 14 | Ht 59.0 in | Wt 155.0 lb

## 2022-11-20 DIAGNOSIS — F32A Depression, unspecified: Secondary | ICD-10-CM | POA: Diagnosis not present

## 2022-11-20 DIAGNOSIS — I10 Essential (primary) hypertension: Secondary | ICD-10-CM

## 2022-11-20 NOTE — Progress Notes (Signed)
   Care Guide Note  11/20/2022 Name: Christy Price MRN: [id number] DOB: 01-02-1971  Referred by: Ivonne Andrew, NP Reason for referral : Care Coordination (Outreach to schedule with Pharm d )   Christy Price is a 52 y.o. year old female who is a primary care patient of Ivonne Andrew, NP. Christy Price was referred to the pharmacist for assistance related to HTN.    An unsuccessful telephone outreach was attempted today to contact the patient who was referred to the pharmacy team for assistance with medication management. Additional attempts will be made to contact the patient.   Penne Lash, RMA Care Guide [facility]  [place], [place] [address] Direct Dial: [phone] [email or web address]

## 2022-11-20 NOTE — Progress Notes (Unsigned)
@Patient  ID: Christy Price, female    DOB: 1971/02/11, 52 y.o.   MRN: [id number]  Chief Complaint  Patient presents with   Follow-up    Referring provider: Ivonne Andrew, NP   HPI  Christy Price presents for follow up. She  has a past medical history of Cancer (HCC), Hypertension, and Meningitis.   Patient presents today for a follow-up on anxiety and depression.  Patient does have a slightly was interpreter here in the office with her today.  We have tried multiple medications with this patient and she states that none have been working.  We will place a referral for her for psychiatry evaluation and treatment.  Patient does have a history of hypertension lisinopril, HCTZ, under her medication list.  Patient states that she is not taking lisinopril.  We will have her meet with pharmacy to discuss her hypertension medications.  It appears that she has some confusion as to what she is taking. Denies f/c/s, n/v/d, hemoptysis, PND, leg swelling Denies chest pain or edema       Allergies  Allergen Reactions   Epinephrine Other (See Comments)   Oxycodone    Rifampin     Immunization History  Administered Date(s) Administered   Influenza,inj,Quad PF,6+ Mos 11/04/2018, 12/28/2019   PFIZER(Purple Top)SARS-COV-2 Vaccination 06/10/2019, 07/06/2019, 04/07/2020   Tdap 04/09/2017    Past Medical History:  Diagnosis Date   Allergy    seasonal   Anxiety    Arthritis    shoulder,knees,feet   Back spasm    Cancer (HCC)     hx cervical cx    Depression    GERD (gastroesophageal reflux disease)    Hypertension    Meningitis    Seizures (HCC)    greater than 25 years ago    Tobacco History: Social History   Tobacco Use  Smoking Status Every Day   Current packs/day: 1.50   Types: Cigarettes   Passive exposure: Never  Smokeless Tobacco Never  Tobacco Comments   1 pack a week.   Ready to quit: Not Answered Counseling given: Not Answered Tobacco  comments: 1 pack a week.   Outpatient Encounter Medications as of 11/20/2022  Medication Sig   Blood Pressure Monitor MISC Use as directed daily (Patient not taking: Reported on 09/20/2022)   Cholecalciferol (D3 PO) Take by mouth.   cyclobenzaprine (FLEXERIL) 10 MG tablet Take 1 tablet (10 mg total) by mouth 3 (three) times daily as needed for muscle spasms.   escitalopram (LEXAPRO) 10 MG tablet TAKE 1 TABLET EVERY DAY (Patient not taking: Reported on 09/20/2022)   furosemide (LASIX) 20 MG tablet Take 1 tablet (20 mg total) by mouth daily.   gabapentin (NEURONTIN) 300 MG capsule Take 1 capsule (300 mg total) by mouth 2 (two) times daily for 10 days.   hydrochlorothiazide (HYDRODIURIL) 25 MG tablet Take 1 tablet (25 mg total) by mouth daily.   hydrOXYzine (ATARAX/VISTARIL) 10 MG tablet Take 1 tablet (10 mg total) by mouth 3 (three) times daily as needed. (Patient not taking: Reported on 09/20/2022)   lisinopril (ZESTRIL) 10 MG tablet Take 1 tablet (10 mg total) by mouth daily. (Patient not taking: Reported on 06/07/2022)   methylPREDNISolone (MEDROL DOSEPAK) 4 MG TBPK tablet Take per packet instructions (Patient not taking: Reported on 09/20/2022)   pantoprazole (PROTONIX) 40 MG tablet TAKE 1 TABLET EVERY DAY   predniSONE (DELTASONE) 10 MG tablet Take 1 tablet (10 mg total) by mouth daily with breakfast. (Patient not taking:  Reported on 06/07/2022)   terbinafine (LAMISIL) 250 MG tablet Take 1 tablet (250 mg total) by mouth daily.   zolpidem (AMBIEN) 10 MG tablet Take 10 mg by mouth at bedtime as needed for sleep. (Patient not taking: Reported on 06/07/2022)   [DISCONTINUED] buPROPion ER (WELLBUTRIN SR) 100 MG 12 hr tablet TAKE 1 TABLET BY MOUTH TWICE A DAY   Facility-Administered Encounter Medications as of 11/20/2022  Medication   0.9 %  sodium chloride infusion     Review of Systems  Review of Systems  Constitutional: Negative.   HENT: Negative.    Cardiovascular: Negative.    Gastrointestinal: Negative.   Allergic/Immunologic: Negative.   Neurological: Negative.   Psychiatric/Behavioral: Negative.         Physical Exam  BP (!) 148/92 (BP Location: Right Arm, Patient Position: Sitting, Cuff Size: Normal)   Pulse 68   Temp 98.2 F (36.8 C)   Resp 14   Ht 4\' 11"  (1.499 m)   Wt 155 lb (70.3 kg)   SpO2 98%   BMI 31.31 kg/m   Wt Readings from Last 5 Encounters:  11/20/22 155 lb (70.3 kg)  09/20/22 156 lb 12.8 oz (71.1 kg)  07/05/22 154 lb 3.2 oz (69.9 kg)  06/07/22 155 lb 12.8 oz (70.7 kg)  05/29/22 155 lb 12.8 oz (70.7 kg)     Physical Exam Vitals and nursing note reviewed.  Constitutional:      General: She is not in acute distress.    Appearance: She is well-developed.  Cardiovascular:     Rate and Rhythm: Normal rate and regular rhythm.  Pulmonary:     Effort: Pulmonary effort is normal.     Breath sounds: Normal breath sounds.  Neurological:     Mental Status: She is alert and oriented to person, place, and time.      Lab Results:  CBC    Component Value Date/Time   WBC 8.8 09/20/2022 1106   WBC 9.9 08/22/2016 1534   RBC 4.71 09/20/2022 1106   RBC 4.53 08/22/2016 1534   HGB 14.7 09/20/2022 1106   HCT 45.5 09/20/2022 1106   PLT 312 09/20/2022 1106   MCV 97 09/20/2022 1106   MCH 31.2 09/20/2022 1106   MCH 31.3 08/22/2016 1534   MCHC 32.3 09/20/2022 1106   MCHC 34.1 08/22/2016 1534   RDW 12.4 09/20/2022 1106   LYMPHSABS 2.8 04/30/2021 0843   MONOABS 792 08/22/2016 1534   EOSABS 0.1 04/30/2021 0843   BASOSABS 0.1 04/30/2021 0843    BMET    Component Value Date/Time   NA 140 09/20/2022 1106   K 3.7 09/20/2022 1106   CL 100 09/20/2022 1106   CO2 25 09/20/2022 1106   GLUCOSE 61 (L) 09/20/2022 1106   GLUCOSE 84 10/07/2016 1536   BUN 13 09/20/2022 1106   CREATININE 0.69 09/20/2022 1106   CREATININE 0.81 10/07/2016 1536   CALCIUM 9.6 09/20/2022 1106   GFRNONAA 96 11/05/2019 1445   GFRNONAA >89 08/22/2016 1534    GFRAA 111 11/05/2019 1445   GFRAA >89 08/22/2016 1534    BNP    Component Value Date/Time   BNP 58.0 08/22/2016 1534     Assessment & Plan:   Depression - Ambulatory referral to Psychiatry   2. Primary hypertension  - AMB Referral to Pharmacy Medication Management   Follow up:  Follow up in 3 months     Ivonne Andrew, NP 11/21/2022

## 2022-11-20 NOTE — Patient Instructions (Addendum)
1. Depression, unspecified depression type  - Ambulatory referral to Psychiatry   2. Primary hypertension  - AMB Referral to Pharmacy Medication Management   Follow up:  Follow up in 3 months

## 2022-11-20 NOTE — Progress Notes (Unsigned)
Pt is here for f/u   Wants to stop her anti-depressant medication states it makes her feel more depressed

## 2022-11-21 ENCOUNTER — Encounter: Payer: Self-pay | Admitting: Nurse Practitioner

## 2022-11-21 NOTE — Assessment & Plan Note (Signed)
-   Ambulatory referral to Psychiatry   2. Primary hypertension  - AMB Referral to Pharmacy Medication Management   Follow up:  Follow up in 3 months

## 2022-11-21 NOTE — Addendum Note (Signed)
Addended by: Renelda Loma on: 11/21/2022 12:22 PM   Modules accepted: Orders

## 2022-11-25 NOTE — Progress Notes (Signed)
Care Guide Note  11/25/2022 Name: Christy Price MRN: [id number] DOB: 1971-02-01  Referred by: Ivonne Andrew, NP Reason for referral : Care Coordination (Outreach to schedule with Pharm d )   Christy Price is a 52 y.o. year old female who is a primary care patient of Ivonne Andrew, NP. Christy Price was referred to the pharmacist for assistance related to HTN.    Successful contact was made with the patient to discuss pharmacy services including being ready for the pharmacist to call at least 5 minutes before the scheduled appointment time, to have medication bottles and any blood sugar or blood pressure readings ready for review. The patient agreed to meet with the pharmacist via with the pharmacist via face to face  on (date/time).   12/25/2022  Penne Lash, RMA Care Guide [facility]  [place], [place] [address] Direct Dial: [phone] [email or web address]

## 2022-11-26 ENCOUNTER — Telehealth: Payer: Self-pay

## 2022-11-26 NOTE — Progress Notes (Unsigned)
Care Guide Note  11/26/2022 Name: [name] MRN: [id number] DOB: 15-Aug-1970  Referred by: Ivonne Andrew, NP Reason for referral : Care Coordination (Outreach to reschedule with Pharm d on a Thursday )   Christy Price is a 52 y.o. year old female who is a primary care patient of Ivonne Andrew, NP. Vernon Gwendolyne Coppa was referred to the pharmacist for assistance related to HTN.    An unsuccessful telephone outreach was attempted today to contact the patient who was referred to the pharmacy team for assistance with medication management. Additional attempts will be made to contact the patient.   Penne Lash, RMA Care Guide [facility]  [place], [place] [address] Direct Dial: [phone] [email or web address]

## 2022-11-27 NOTE — Progress Notes (Signed)
Care Guide Note  11/27/2022 Name: Christy Price MRN: [id number] DOB: 27-Feb-1971  Referred by: Ivonne Andrew, NP Reason for referral : Care Coordination (Outreach to reschedule with Pharm d on a Thursday )   Christy Price is a 52 y.o. year old female who is a primary care patient of Ivonne Andrew, NP. Christy Price was referred to the pharmacist for assistance related to HTN.    A second unsuccessful telephone outreach was attempted today to contact the patient who was referred to the pharmacy team for assistance with medication management. Additional attempts will be made to contact the patient.  Penne Lash, RMA Care Guide [facility]  [place], [place] [address] Direct Dial: [phone] [email or web address]

## 2022-11-27 NOTE — Progress Notes (Signed)
Care Guide Note  11/27/2022 Name: Christy Price MRN: [id number] DOB: 20-Nov-1970  Referred by: Ivonne Andrew, NP Reason for referral : Care Coordination (Outreach to reschedule with Pharm d on a Thursday )   [name] is a 52 y.o. year old female who is a primary care patient of Ivonne Andrew, NP. Megumi Yenia Rigano was referred to the pharmacist for assistance related to HTN.    Successful contact was made with the patient to discuss pharmacy services including being ready for the pharmacist to call at least 5 minutes before the scheduled appointment time, to have medication bottles and any blood sugar or blood pressure readings ready for review. The patient agreed to meet with the pharmacist via with the pharmacist via face to face 12/26/2022 on (date/time).    Penne Lash, RMA Care Guide [facility]  [place], [place] [address] Direct Dial: [phone] [email or web address]

## 2022-12-25 ENCOUNTER — Ambulatory Visit: Payer: Self-pay

## 2022-12-26 ENCOUNTER — Ambulatory Visit: Payer: Self-pay

## 2022-12-27 ENCOUNTER — Telehealth: Payer: Self-pay | Admitting: Pharmacist

## 2022-12-27 NOTE — Progress Notes (Signed)
Patient did not present for appointment. Will collaborate with team to reschedule.   Catie Eppie Gibson, PharmD, BCACP, CPP Clinical Pharmacist [facility] [phone]

## 2023-01-01 ENCOUNTER — Telehealth: Payer: Self-pay

## 2023-01-01 NOTE — Progress Notes (Signed)
   Care Guide Note  01/01/2023 Name: Articia Holtzinger MRN: [id number] DOB: [birth year]  Referred by: Ivonne Andrew, NP Reason for referral : No chief complaint on file.   Sangeeta Normandy Zittel is a 52 y.o. year old female who is a primary care patient of Ivonne Andrew, NP. Shalanda Leza Ashdown was referred to the pharmacist for assistance related to HTN.    An unsuccessful telephone outreach was attempted today to contact the patient who was referred to the pharmacy team for assistance with medication management. Additional attempts will be made to contact the patient.   Penne Lash, RMA Care Guide [facility]  [place], [place] [address] Direct Dial: [phone] [email or web address]

## 2023-01-08 NOTE — Progress Notes (Signed)
   Care Guide Note  01/08/2023 Name: Christy Price MRN: [id number] DOB: 12-14-1970  Referred by: Ivonne Andrew, NP Reason for referral : Care Coordination (Outreach to reschedule with Pharm d )   Christy Price is a 52 y.o. year old female who is a primary care patient of Ivonne Andrew, NP. Christy Price was referred to the pharmacist for assistance related to HTN.    Successful contact was made with the patient to discuss pharmacy services including being ready for the pharmacist to call at least 5 minutes before the scheduled appointment time, to have medication bottles and any blood sugar or blood pressure readings ready for review. The patient agreed to meet with the pharmacist via with the pharmacist via face to face 02/04/2023 on (date/time).    Christy Price, RMA Care Guide [facility]  [place], [place] [address] Direct Dial: [phone] [email or web address]

## 2023-02-03 NOTE — Progress Notes (Unsigned)
02/04/2023 Name: Christy Price MRN: [id number] DOB: [birth year]  Chief Complaint  Patient presents with   Hypertension   Hyperlipidemia   Medication Management   Nicotine Dependence    Christy Price is a 52 y.o. year old female who was referred for medication management by their primary care provider, Ivonne Andrew, NP. They presented for a face to face visit today. In-person ASL interpreter used today.    They were referred to the pharmacist by their PCP for assistance in managing hypertension   Subjective:  Care Team: Primary Care Provider: Ivonne Andrew, NP ; Next Scheduled Visit: 03/28/2023  Medication Access/Adherence  Current Pharmacy:  [id number] [place], [place] - [address] [place] [address] Phone: [phone] Fax: [phone]  Gerri Spore LONG - [facility] [address] [place] [address] Phone: [phone] Fax: [phone]  [facility] - [place], [place] - [address] [place] [address] Phone: [phone] Fax: [phone]  [facility] [address] [place] [address] Phone: [phone] Fax: [phone]   Hypertension:  Current medications: hydrochlorothiazide 25 mg, furosemide 20 mg daily  Medications previously tried: lisinopril (no side effects - just stopped it but unsure why)  Current smoker - smokes 2-3 cigarettes/day since she was 17. Patient also reports some blurriness in her vision after taking the Lasix occasionally. Does not report this to be a major concern.  Patient has a validated, automated, upper arm home BP cuff Current blood pressure readings readings: hasn't been checking   Patient denies hypotensive s/sx including dizziness, lightheadedness.  Patient denies hypertensive symptoms including headache, chest pain, shortness  of breath  Current meal patterns: don't salt food, looks at salt on nutrition labels, eats frozen vegetables, and doesn't eat fast food or frozen meals  - sometimes goes to [employer] or [employer] if she does eat out   Current physical activity: walks dog daily  Hyperlipidemia/ASCVD Risk Reduction  Current lipid lowering medications: none  ASCVD History: none Family History: none Risk Factors: HTN  The 10-year ASCVD risk score (Arnett DK, et al., 2019) is: 5.2%   Values used to calculate the score:     Age: 68 years     Sex: Female     Is Non-Hispanic African American: No     Diabetic: No     Tobacco smoker: Yes     Systolic Blood Pressure: 137 mmHg     Is BP treated: Yes     HDL Cholesterol: 76 mg/dL     Total Cholesterol: 205 mg/dL    Objective:  Lab Results  Component Value Date   HGBA1C 4.8 05/07/2019    Lab Results  Component Value Date   CREATININE 0.69 09/20/2022   BUN 13 09/20/2022   NA 140 09/20/2022   K 3.7 09/20/2022   CL 100 09/20/2022   CO2 25 09/20/2022    Lab Results  Component Value Date   CHOL 205 (H) 09/20/2022   HDL 76 09/20/2022   LDLCALC 116 (H) 09/20/2022   TRIG 71 09/20/2022   CHOLHDL 2.7 09/20/2022    Medications Reviewed Today     Reviewed by Roslyn Smiling, [facility] (Pharmacist) on 02/04/23 at 1427  Med List Status: <None>   Medication Order Taking? Sig Documenting Provider Last Dose Status Informant  Blood Pressure Monitor MISC [id number]  Use as directed daily  Patient  not taking: Reported on 09/20/2022   Barbette Merino, NP  Active   Cholecalciferol (D3 PO) [id number] Yes Take by mouth. [provider] Taking Active   cyclobenzaprine (FLEXERIL) 10 MG tablet [id number] Yes Take 1 tablet (10 mg total) by mouth 3 (three) times daily as needed for muscle spasms. Barbette Merino, NP Taking Active            Med Note (LONG, ASHLEY L   Mon Nov 06, 2020 11:46 AM) As needed  escitalopram (LEXAPRO) 10 MG tablet [id number] No TAKE 1  TABLET EVERY DAY  Patient not taking: Reported on 09/20/2022   Ivonne Andrew, NP Not Taking Active   furosemide (LASIX) 20 MG tablet [id number] Yes Take 1 tablet (20 mg total) by mouth daily. Ivonne Andrew, NP Taking Active   gabapentin (NEURONTIN) 300 MG capsule [id number] Yes Take 1 capsule (300 mg total) by mouth 2 (two) times daily for 10 days. Ivonne Andrew, NP Taking Active            Med Note Cornelius Moras, Malachi Kinzler   Tue Feb 04, 2023  2:13 PM) As needed   hydrochlorothiazide (HYDRODIURIL) 25 MG tablet [id number] Yes Take 1 tablet (25 mg total) by mouth daily. Ivonne Andrew, NP Taking Active   hydrOXYzine (ATARAX/VISTARIL) 10 MG tablet [id number] Yes Take 1 tablet (10 mg total) by mouth 3 (three) times daily as needed. Orion Crook I, NP Taking Active            Med Note Sherilyn Cooter, [facility]   Fri Jun 07, 2022  8:40 AM) Prn   lisinopril (ZESTRIL) 10 MG tablet [id number] No Take 1 tablet (10 mg total) by mouth daily.  Patient not taking: Reported on 06/07/2022   Kallie Locks, FNP Not Taking Active   methylPREDNISolone (MEDROL DOSEPAK) 4 MG TBPK tablet [id number] No Take per packet instructions  Patient not taking: Reported on 09/20/2022   Amador Cunas, PA-C Not Taking Active   pantoprazole (PROTONIX) 40 MG tablet [id number] Yes TAKE 1 TABLET EVERY DAY Ivonne Andrew, NP Taking Active   predniSONE (DELTASONE) 10 MG tablet [id number] No Take 1 tablet (10 mg total) by mouth daily with breakfast.  Patient not taking: Reported on 06/07/2022   Nadara Mustard, MD Not Taking Active   terbinafine (LAMISIL) 250 MG tablet [id number] Yes Take 1 tablet (250 mg total) by mouth daily. Ivonne Andrew, NP Taking Active   zolpidem (AMBIEN) 10 MG tablet [id number] No Take 10 mg by mouth at bedtime as needed for sleep.  Patient not taking: Reported on 06/07/2022   [provider] Not Taking Active             Assessment/Plan:   Hypertension: - Currently uncontrolled - BP today was  137/75 mmHg (goal BP <130/80 mmHg) - Reviewed long term cardiovascular and renal outcomes of uncontrolled blood pressure - Reviewed appropriate blood pressure monitoring technique and reviewed goal blood pressure. Recommended to check home blood pressure and heart rate daily.  - Recommend to continue hydrochlorothiazide 25 mg and begin lisinopril 10 mg daily  - Recommend to continue the Lasix but monitor vision. Suggested that patient could try taking the Lasix as needed for her swelling as well.  - Recommend obtaining a BMP at next visit to monitor electrolytes and kidney function with resuming lisinopril - Will collaborate with PCP to send in new prescription for lisinopril   Hyperlipidemia/ASCVD Risk Reduction: - Currently uncontrolled based on last LDL of  116 (LDL goal <100) - Reviewed long term complications of uncontrolled cholesterol - Recommend to continue without medication and work on lifestyle modifications based on low ASCVD risk, family history, and risk factors.Could consider adding statin therapy if LDL remains elevated at repeat lipid panel   Tobacco Cessation: - Patient reports trying nicotine gum and patches in the past. The gum was tolerated okay but the patches gave her a bad headache. Would avoid Chantix at this time due to concerns for uncontrolled mental health. Patient is interested in quitting smoking.  - Recommend trying lozenges to aid in smoking cessation . Behavioral interviewing provided.   Social Work Need - Will place a referral for social work for depression/anxiety and stress assistance   Medication Access: - Will collaborate with PCP to refill hydroxyzine   Follow Up Plan: 03/11/2023 with pharmacist in-person  Roslyn Smiling, PharmD PGY1 Pharmacy Resident 02/04/2023 3:15 PM   I have reviewed the pharmacist's encounter and agree with their documentation.   Catie Eppie Gibson, PharmD, BCACP, CPP [facility] [phone]

## 2023-02-04 ENCOUNTER — Ambulatory Visit (INDEPENDENT_AMBULATORY_CARE_PROVIDER_SITE_OTHER): Payer: Medicare HMO | Admitting: Pharmacist

## 2023-02-04 ENCOUNTER — Encounter: Payer: Self-pay | Admitting: Pharmacist

## 2023-02-04 VITALS — BP 137/75 | HR 65

## 2023-02-04 DIAGNOSIS — I10 Essential (primary) hypertension: Secondary | ICD-10-CM | POA: Diagnosis not present

## 2023-02-04 DIAGNOSIS — F32A Depression, unspecified: Secondary | ICD-10-CM | POA: Diagnosis not present

## 2023-02-04 MED ORDER — LISINOPRIL 10 MG PO TABS: 10.0000 mg | ORAL_TABLET | Freq: Every day | ORAL | 3 refills | Status: DC

## 2023-02-04 MED ORDER — NICOTINE POLACRILEX 2 MG MT LOZG: 2.0000 mg | LOZENGE | OROMUCOSAL | 0 refills | Status: AC | PRN

## 2023-02-04 NOTE — Patient Instructions (Addendum)
Hi Ms. Christy Price, Pello hydrochlorothiazide 25 mg daily and BEGIN taking lisinopril 10 mg daily for your blood pressure.   Today, your blood pressure was 137/75 mmHg.   Check your blood pressure periodically, and any time you have concerning symptoms like headache, chest pain, dizziness, shortness of breath, or vision changes.   Our goal is less than 130/80.  To appropriately check your blood pressure, make sure you do the following:  1) Avoid caffeine, exercise, or tobacco products for 30 minutes before checking. Empty your bladder. 2) Sit with your back supported in a flat-backed chair. Rest your arm on something flat (arm of the chair, table, etc). 3) Sit still with your feet flat on the floor, resting, for at least 5 minutes.  4) Check your blood pressure. Take 1-2 readings.  5) Write down these readings and bring with you to any provider appointments.  Bring your home blood pressure machine with you to a provider's office for accuracy comparison at least once a year.   Make sure you take your blood pressure medications before you come to any office visit, even if you were asked to fast for labs.   Thank you, Christy Price, PharmD

## 2023-02-05 ENCOUNTER — Telehealth: Payer: Self-pay | Admitting: *Deleted

## 2023-02-05 NOTE — Progress Notes (Signed)
  Care Coordination   Note   02/05/2023 Name: Wylee Rivere MRN: [id number] DOB: March 06, 1971  Elmarie Shiley Kaehler is a 52 y.o. year old female who sees Ivonne Andrew, NP for primary care. I reached out to [facility] by phone today to offer care coordination services.  Ms. Munsen was given information about Care Coordination services today including:   The Care Coordination services include support from the care team which includes your Nurse Coordinator, Clinical Social Worker, or Pharmacist.  The Care Coordination team is here to help remove barriers to the health concerns and goals most important to you. Care Coordination services are voluntary, and the patient may decline or stop services at any time by request to their care team member.   Care Coordination Consent Status: Patient agreed to services and verbal consent obtained.   Follow up plan:  Telephone appointment with care coordination team member scheduled for:  02/10/2023  Encounter Outcome:  Patient Scheduled from referral   Burman Nieves, [facility] Care Coordination Care Guide Direct Dial: [phone]

## 2023-02-05 NOTE — Progress Notes (Signed)
  Care Coordination  Outreach Note  02/05/2023 Name: Ayriel Kovalsky MRN: [id number] DOB: 1970/07/31   Care Coordination Outreach Attempts: An unsuccessful telephone outreach was attempted today to offer the patient information about available care coordination services.  Follow Up Plan:  Additional outreach attempts will be made to offer the patient care coordination information and services.   Encounter Outcome:  No Answer  Burman Nieves, CCMA Care Coordination Care Guide Direct Dial: [phone]

## 2023-02-10 ENCOUNTER — Ambulatory Visit: Payer: Self-pay | Admitting: Licensed Clinical Social Worker

## 2023-02-10 NOTE — Patient Outreach (Signed)
  Care Coordination   02/10/2023 Name: Christy Price MRN: [id number] DOB: 01/08/1971   Care Coordination Outreach Attempts:  An unsuccessful telephone outreach was attempted today to offer the patient information about available care coordination services.  Follow Up Plan:  Additional outreach attempts will be made to offer the patient care coordination information and services.   Encounter Outcome:  No Answer   Care Coordination Interventions:  No, not indicated   Gwyndolyn Saxon MSW, LCSW Licensed Clinical Social Worker  [facility], Population Health Direct Dial: [phone]  Fax: [phone]

## 2023-02-12 ENCOUNTER — Ambulatory Visit: Payer: Self-pay | Admitting: Licensed Clinical Social Worker

## 2023-02-12 NOTE — Patient Outreach (Signed)
  Care Coordination   02/12/2023 Name: Christy Price MRN: [id number] DOB: Sep 21, 1970   Care Coordination Outreach Attempts:  An unsuccessful telephone outreach was attempted today to offer the patient information about available care coordination services.  Follow Up Plan:  Additional outreach attempts will be made to offer the patient care coordination information and services.   Encounter Outcome:  No Answer   Care Coordination Interventions:  No, not indicated   Gwyndolyn Saxon MSW, LCSW Licensed Clinical Social Worker  [facility], Population Health Direct Dial: [phone]  Fax: [phone]

## 2023-02-18 ENCOUNTER — Telehealth: Payer: Self-pay | Admitting: *Deleted

## 2023-02-18 NOTE — Progress Notes (Signed)
  Care Coordination Note  02/18/2023 Name: Christy Price MRN: [id number] DOB: December 09, 1970  [name] is a 52 y.o. year old female who is a primary care patient of Ivonne Andrew, NP and is actively engaged with the care management team. I reached out to Priscille Heidelberg by phone today to assist with re-scheduling an initial visit with the Licensed Clinical Social Worker  Follow up plan: We have been unable to make contact with the patient for follow up.   Burman Nieves, CCMA Care Coordination Care Guide Direct Dial: [phone]

## 2023-03-11 ENCOUNTER — Ambulatory Visit (INDEPENDENT_AMBULATORY_CARE_PROVIDER_SITE_OTHER): Payer: Self-pay | Admitting: Pharmacist

## 2023-03-11 VITALS — BP 146/70 | HR 60

## 2023-03-11 DIAGNOSIS — I10 Essential (primary) hypertension: Secondary | ICD-10-CM

## 2023-03-11 DIAGNOSIS — Z23 Encounter for immunization: Secondary | ICD-10-CM | POA: Diagnosis not present

## 2023-03-11 MED ORDER — VALSARTAN 80 MG PO TABS: 80.0000 mg | ORAL_TABLET | Freq: Every day | ORAL | 3 refills | Status: DC

## 2023-03-11 NOTE — Patient Instructions (Addendum)
 Rico,   It was great talking to you today!  Start valsartan  80 mg daily. Continue hydrochlorothiazide  25 mg daily.   [facility]  - Help you find nicotine  lozenges that are 4 mg and buy 1 box. I want you to chew one of these when you are craving a cigarette. When you feel it tingle in your mouth, stop chewing and just park it inside your gum.  - COVID booster  I will ask the social work team about how to do an in person visit and we will call you.   Your MyChart User Name is: [email or web address] Your password is: [email or web address]  Give it about an hour. If that does not work, see if someone can help you call the Help Desk to reset your password- [phone]  Check your blood pressure twice weekly, and any time you have concerning symptoms like headache, chest pain, dizziness, shortness of breath, or vision changes.   Our goal is less than 130/80.  To appropriately check your blood pressure, make sure you do the following:  1) Avoid caffeine, exercise, or tobacco products for 30 minutes before checking. Empty your bladder. 2) Sit with your back supported in a flat-backed chair. Rest your arm on something flat (arm of the chair, table, etc). 3) Sit still with your feet flat on the floor, resting, for at least 5 minutes.  4) Check your blood pressure. Take 1-2 readings.  5) Write down these readings and bring with you to any provider appointments.  Bring your home blood pressure machine with you to a provider's office for accuracy comparison at least once a year.   Make sure you take your blood pressure medications before you come to any office visit, even if you were asked to fast for labs.

## 2023-03-11 NOTE — Progress Notes (Signed)
 03/11/2023 Name: Syriana Croslin MRN: [id number] DOB: 1970-07-19  No chief complaint on file.   Lurae Careena Degraffenreid is a 52 y.o. year old female who was referred for medication management by their primary care provider, Oley Bascom RAMAN, NP. They presented for a face to face visit today.   They were referred to the pharmacist by their PCP for assistance in managing hypertension    Subjective:  Care Team: Primary Care Provider: Oley Bascom RAMAN, NP ; Next Scheduled Visit:   Medication Access/Adherence  [facility]acy[facility]  [id number] [place], [place] - [address] [place] [address] Phone: [phone] Fax: [phone]  DARRYLE LONG - [facility] [address] [place] [address] Phone: [phone] Fax: [phone]  [facility] - [place], [place] - [address] [place] [address] Phone: [phone] Fax: [phone]  [facility] [address] [place] [address] Phone: [phone] Fax: [phone]   Patient reports affordability concerns with their medications: No  Patient reports access/transportation concerns to their pharmacy: No  Patient reports adherence concerns with their medications:  No     Hypertension:  Current medications: hydrochlorothiazide  25 mg daily, stopped lisinopril  as she said it made her feel funny - difficult for her to describe what she means by funny. Does not describe any tongue swelling/itching.   Also has pedal edema today - notes this is chronic, improves with elevation. Has tried compression stockings before and they have not helped.   Patient has a validated, automated, upper arm home BP cuff, but has not been checking - notes she doesn't like how much her home cuff squeezes her arm  Patient denies hypotensive s/sx including dizziness,  lightheadedness.  Patient denies hypertensive symptoms including headache, chest pain, shortness of breath  Tobacco Abuse:  Tobacco Use History: Number of cigarettes per day: most 4 per day; has not picked up lozenges. Likely not covered by insurance.    Objective:  Lab Results  Component Value Date   HGBA1C 4.8 05/07/2019    Lab Results  Component Value Date   CREATININE 0.69 09/20/2022   BUN 13 09/20/2022   NA 140 09/20/2022   K 3.7 09/20/2022   CL 100 09/20/2022   CO2 25 09/20/2022    Lab Results  Component Value Date   CHOL 205 (H) 09/20/2022   HDL 76 09/20/2022   LDLCALC 116 (H) 09/20/2022   TRIG 71 09/20/2022   CHOLHDL 2.7 09/20/2022    Medications Reviewed Today     Reviewed by Rudy Dorothyann DASEN, RPH-CPP (Pharmacist) on 03/11/23 at 1217  Med List Status: <None>   Medication Order Taking? Sig Documenting Provider Last Dose Status Informant   Patient not taking:   Discontinued 03/11/23 1156   Cholecalciferol (D3 PO) [id number]  Take by mouth. [provider]  Active   cyclobenzaprine  (FLEXERIL ) 10 MG tablet [id number]  Take 1 tablet (10 mg total) by mouth 3 (three) times daily as needed for muscle spasms. Myrna Camelia HERO, NP  Active            Med Note MARCHA, ROSINA LITTIE Kitchens Nov 06, 2020 11:46 AM) As needed  escitalopram  (LEXAPRO ) 10 MG tablet [id number]  TAKE 1 TABLET EVERY DAY  Patient not taking: Reported on 09/20/2022   Oley Bascom RAMAN, NP  Active   furosemide  (LASIX ) 20 MG tablet [id number] Yes  Take 1 tablet (20 mg total) by mouth daily. Oley Bascom RAMAN, NP Taking Active   gabapentin  (NEURONTIN ) 300 MG capsule [id number]  Take 1 capsule (300 mg total) by mouth 2 (two) times daily for 10 days. Oley Bascom RAMAN, NP  Expired 02/04/23 2359            Med Note MARZETTA, MADISON   Tue Feb 04, 2023  2:13 PM) As needed   hydrochlorothiazide  (HYDRODIURIL ) 25 MG tablet [id number] Yes Take 1 tablet (25 mg total) by mouth daily. Oley Bascom RAMAN, NP Taking Active    hydrOXYzine  (ATARAX /VISTARIL ) 10 MG tablet [id number] No Take 1 tablet (10 mg total) by mouth 3 (three) times daily as needed.  Patient not taking: Reported on 03/11/2023   Shannan Christain FERNS, NP Not Taking Active            Med Note SAMULE IHA   Fri Jun 07, 2022  8:40 AM) Prn   Patient not taking:  Discontinued 03/11/23 1155    Patient not taking:   Discontinued 03/11/23 1124 (Completed Course)   nicotine  polacrilex (NICOTINE  MINI) 2 MG lozenge [id number] Yes Take 1 lozenge (2 mg total) by mouth as needed for smoking cessation. Oley Bascom RAMAN, NP Taking Active   pantoprazole  (PROTONIX ) 40 MG tablet [id number]  TAKE 1 TABLET EVERY DAY Nichols, Tonya S, NP  Active    Patient not taking:   Discontinued 03/11/23 1127 (Completed Course)   terbinafine  (LAMISIL ) 250 MG tablet [id number]  Take 1 tablet (250 mg total) by mouth daily. Oley Bascom RAMAN, NP  Active   valsartan  (DIOVAN ) 80 MG tablet [id number] Yes Take 1 tablet (80 mg total) by mouth daily. Oley Bascom RAMAN, NP  Active   zolpidem (AMBIEN) 10 MG tablet [id number]  Take 10 mg by mouth at bedtime as needed for sleep.  Patient not taking: Reported on 06/07/2022   [provider]  Active               Assessment/Plan:   Patient also requested flu shot today, provided.   Hypertension: - Currently uncontrolled - Recommend to start valsartan  80 mg daily, continue hydrochlorothiazide  25 mg daily. Would avoid amlodipine due to existing edema. Will collaborate with PCP on this recommendation.  - Reviewed appropriate blood pressure monitoring technique and reviewed goal blood pressure. Recommended to check home blood pressure and heart rate daily, document, and provide at future appointments. Will place order for XL cuff through Medicaid through [facility] - [facility] to obtain nicotine  4 mg lozenges. Counseled on technique.   Patient requests in person therapy services for  anxiety/depression. Will collaborate with PCP to place referral.   Follow Up Plan: follow up with PCP in 2 weeks + BMP  Catie IVAR Centers, PharmD, BCACP, CPP Clinical Pharmacist [facility] [phone]

## 2023-03-28 ENCOUNTER — Ambulatory Visit: Payer: Self-pay | Admitting: Nurse Practitioner

## 2023-04-02 ENCOUNTER — Ambulatory Visit: Payer: Self-pay | Admitting: Nurse Practitioner

## 2023-04-11 ENCOUNTER — Other Ambulatory Visit: Payer: Self-pay | Admitting: Nurse Practitioner

## 2023-04-11 NOTE — Telephone Encounter (Signed)
 Please advise [facility]

## 2023-04-16 ENCOUNTER — Telehealth: Payer: Self-pay | Admitting: Nurse Practitioner

## 2023-04-16 NOTE — Telephone Encounter (Signed)
 Copied from CRM [id number]. Topic: Appointments - Appointment Info/Confirmation >> Apr 16, 2023  2:29 PM Suzette B wrote: Patient/patient representative is calling for information regarding an appointment. Intrepreter called with patient on the line for Sign language and advised she will be in office tomorrow for appt, and reminding the office she needs an interpreter there for appt.

## 2023-04-17 ENCOUNTER — Ambulatory Visit: Payer: Medicare HMO | Admitting: Nurse Practitioner

## 2023-04-17 ENCOUNTER — Encounter: Payer: Self-pay | Admitting: Nurse Practitioner

## 2023-04-17 VITALS — BP 151/75 | HR 64 | Temp 97.9°F | Wt 158.0 lb

## 2023-04-17 DIAGNOSIS — J069 Acute upper respiratory infection, unspecified: Secondary | ICD-10-CM | POA: Diagnosis not present

## 2023-04-17 MED ORDER — AZITHROMYCIN 250 MG PO TABS: ORAL_TABLET | ORAL | 0 refills | Status: AC

## 2023-04-17 MED ORDER — BENZONATATE 200 MG PO CAPS: 200.0000 mg | ORAL_CAPSULE | Freq: Two times a day (BID) | ORAL | 0 refills | Status: AC | PRN

## 2023-04-17 NOTE — Patient Instructions (Signed)
 1. Viral upper respiratory tract infection (Primary)  - azithromycin  (ZITHROMAX ) 250 MG tablet; Take 2 tablets on day 1, then 1 tablet daily on days 2 through 5  Dispense: 6 tablet; Refill: 0 - benzonatate  (TESSALON ) 200 MG capsule; Take 1 capsule (200 mg total) by mouth 2 (two) times daily as needed for cough.  Dispense: 20 capsule; Refill: 0

## 2023-04-17 NOTE — Progress Notes (Signed)
 Subjective   Patient ID: Christy Price, female    DOB: 1970-09-18, 53 y.o.   MRN: [id number]  Chief Complaint  Patient presents with   Influenza    My husband has the flu and gave it to me on Saturday     Referring provider: Oley Bascom RAMAN, NP  Christy Rakeisha Nyce is a 53 y.o. female with Past Medical History: No date: Allergy     Comment:  seasonal No date: Anxiety No date: Arthritis     Comment:  shoulder,knees,feet No date: Back spasm No date: Cancer (HCC)     Comment:   hx cervical cx  No date: Depression No date: GERD (gastroesophageal reflux disease) No date: Hypertension No date: Meningitis No date: Seizures (HCC)     Comment:  greater than 25 years ago   HPI  Patient presents today for an acute visit.  Patient does have swelling which interpreter present today.  She states that her husband recently tested positive for flu.  She has been having URI symptoms for over a week now.  She is having congestion and cough.  We will trial azithromycin  and cough medicine. Denies f/c/s, n/v/d, hemoptysis, PND, leg swelling Denies chest pain or edema    Allergies  Allergen Reactions   Epinephrine Other (See Comments)   Oxycodone    Rifampin     Immunization History  Administered Date(s) Administered   Influenza, Seasonal, Injecte, Preservative Fre 03/11/2023   Influenza,inj,Quad PF,6+ Mos 11/04/2018, 12/28/2019   PFIZER(Purple Top)SARS-COV-2 Vaccination 06/10/2019, 07/06/2019, 04/07/2020   Tdap 04/09/2017    Tobacco History: Social History   Tobacco Use  Smoking Status Every Day   Current packs/day: 1.50   Types: Cigarettes   Passive exposure: Never  Smokeless Tobacco Never  Tobacco Comments   1 pack a week.   Ready to quit: Yes Counseling given: Yes Tobacco comments: 1 pack a week.   Outpatient Encounter Medications as of 04/17/2023  Medication Sig   azithromycin  (ZITHROMAX ) 250 MG tablet Take 2 tablets on day 1, then 1 tablet daily on days 2  through 5   benzonatate  (TESSALON ) 200 MG capsule Take 1 capsule (200 mg total) by mouth 2 (two) times daily as needed for cough.   Cholecalciferol (D3 PO) Take by mouth.   cyclobenzaprine  (FLEXERIL ) 10 MG tablet Take 1 tablet (10 mg total) by mouth 3 (three) times daily as needed for muscle spasms.   furosemide  (LASIX ) 20 MG tablet Take 1 tablet (20 mg total) by mouth daily.   hydrochlorothiazide  (HYDRODIURIL ) 25 MG tablet Take 1 tablet (25 mg total) by mouth daily.   pantoprazole  (PROTONIX ) 40 MG tablet TAKE 1 TABLET EVERY DAY   terbinafine  (LAMISIL ) 250 MG tablet Take 1 tablet (250 mg total) by mouth daily.   valsartan  (DIOVAN ) 80 MG tablet Take 1 tablet (80 mg total) by mouth daily.   buPROPion  ER (WELLBUTRIN  SR) 100 MG 12 hr tablet TAKE 1 TABLET BY MOUTH TWICE A DAY (Patient not taking: Reported on 04/17/2023)   escitalopram  (LEXAPRO ) 10 MG tablet TAKE 1 TABLET EVERY DAY (Patient not taking: Reported on 04/17/2023)   gabapentin  (NEURONTIN ) 300 MG capsule Take 1 capsule (300 mg total) by mouth 2 (two) times daily for 10 days. (Patient not taking: Reported on 04/17/2023)   hydrOXYzine  (ATARAX /VISTARIL ) 10 MG tablet Take 1 tablet (10 mg total) by mouth 3 (three) times daily as needed. (Patient not taking: Reported on 04/17/2023)   nicotine  polacrilex (NICOTINE  MINI) 2 MG lozenge Take 1  lozenge (2 mg total) by mouth as needed for smoking cessation. (Patient not taking: Reported on 04/17/2023)   zolpidem (AMBIEN) 10 MG tablet Take 10 mg by mouth at bedtime as needed for sleep. (Patient not taking: Reported on 04/17/2023)   No facility-administered encounter medications on file as of 04/17/2023.    Review of Systems  Review of Systems  Constitutional: Negative.   HENT:  Positive for congestion and sinus pain.   Respiratory:  Positive for cough.   Cardiovascular: Negative.   Gastrointestinal: Negative.   Allergic/Immunologic: Negative.   Neurological: Negative.   Psychiatric/Behavioral: Negative.        Objective:   BP (!) 145/72   Pulse 64   Temp 97.9 F (36.6 C)   Wt 158 lb (71.7 kg)   SpO2 98%   BMI 31.91 kg/m   Wt Readings from Last 5 Encounters:  04/17/23 158 lb (71.7 kg)  11/20/22 155 lb (70.3 kg)  09/20/22 156 lb 12.8 oz (71.1 kg)  07/05/22 154 lb 3.2 oz (69.9 kg)  06/07/22 155 lb 12.8 oz (70.7 kg)     Physical Exam Vitals and nursing note reviewed.  Constitutional:      General: She is not in acute distress.    Appearance: She is well-developed.  Cardiovascular:     Rate and Rhythm: Normal rate and regular rhythm.  Pulmonary:     Effort: Pulmonary effort is normal.     Breath sounds: Normal breath sounds.  Neurological:     Mental Status: She is alert and oriented to person, place, and time.       Assessment & Plan:   Viral upper respiratory tract infection -     Azithromycin ; Take 2 tablets on day 1, then 1 tablet daily on days 2 through 5  Dispense: 6 tablet; Refill: 0 -     Benzonatate ; Take 1 capsule (200 mg total) by mouth 2 (two) times daily as needed for cough.  Dispense: 20 capsule; Refill: 0     Return in about 3 months (around 07/15/2023).   Bascom GORMAN Borer, NP 04/17/2023

## 2023-04-23 ENCOUNTER — Ambulatory Visit: Payer: Self-pay | Admitting: Nurse Practitioner

## 2023-04-23 NOTE — Telephone Encounter (Signed)
Copied from CRM [id number]. Topic: Clinical - Red Word Triage >> Apr 23, 2023  1:19 PM Ivette P wrote: [facility] that prompted transfer to Nurse Triage:  right thigh, back area are in alot of pain. and close to Kidney. Drinking lots of water all day. been over a week. Pt had the Flu about 2 weeks ago.  Chief Complaint: right flank pain Symptoms: pain by kidney on right. Frequency: constant Pertinent Negatives: Patient denies fever, rash, burning with urination Disposition: [] ED /[x] Urgent Care (no appt availability in office) / [] Appointment(In office/virtual)/ []  [facility]/ [] Home Care/ [] Refused Recommended Disposition /[] [facility]/ []  Follow-up with PCP Additional Notes: No apt. Available; instructed to go to uc.  Care advice given, denies questions, instructed to go to er if becomes worse.   Reason for Disposition  MODERATE pain (e.g., interferes with normal activities or awakens from sleep)  Answer Assessment - Initial Assessment Questions 1. LOCATION: "Where does it hurt?" (e.g., left, right)     Pain in the right side in my back by my kidney 2. ONSET: "When did the pain start?"     "Hurts really bad", 2 days ago 3. SEVERITY: "How bad is the pain?" (e.g., Scale 1-10; mild, moderate, or severe)   - MILD (1-3): doesn't interfere with normal activities    - MODERATE (4-7): interferes with normal activities or awakens from sleep    - SEVERE (8-10): excruciating pain and patient unable to do normal activities (stays in bed)       7-8/10 4. PATTERN: "Does the pain come and go, or is it constant?"      constant 5. CAUSE: "What do you think is causing the pain?"     unknown 6. OTHER SYMPTOMS:  "Do you have any other symptoms?" (e.g., fever, abdomen pain, vomiting, leg weakness, burning with urination, blood in urine)     Can't sleep on right 7. PREGNANCY:  "Is there any chance you are pregnant?" "When was your last menstrual period?"     na  Protocols used:  Flank Pain-A-AH

## 2023-04-26 ENCOUNTER — Other Ambulatory Visit: Payer: Self-pay | Admitting: Nurse Practitioner

## 2023-04-26 DIAGNOSIS — R600 Localized edema: Secondary | ICD-10-CM

## 2023-04-28 ENCOUNTER — Other Ambulatory Visit: Payer: Self-pay | Admitting: Nurse Practitioner

## 2023-04-28 DIAGNOSIS — I10 Essential (primary) hypertension: Secondary | ICD-10-CM

## 2023-05-16 ENCOUNTER — Other Ambulatory Visit: Payer: Self-pay | Admitting: Nurse Practitioner

## 2023-05-16 NOTE — Telephone Encounter (Signed)
 Please advise [facility]

## 2023-07-11 ENCOUNTER — Other Ambulatory Visit: Payer: Self-pay | Admitting: Nurse Practitioner

## 2023-07-11 NOTE — Telephone Encounter (Signed)
 Please advise [facility]

## 2023-07-17 ENCOUNTER — Encounter: Payer: Self-pay | Admitting: Nurse Practitioner

## 2023-07-17 ENCOUNTER — Ambulatory Visit (INDEPENDENT_AMBULATORY_CARE_PROVIDER_SITE_OTHER): Payer: Self-pay | Admitting: Nurse Practitioner

## 2023-07-17 VITALS — BP 134/79 | HR 64 | Temp 97.9°F | Wt 163.4 lb

## 2023-07-17 DIAGNOSIS — I10 Essential (primary) hypertension: Secondary | ICD-10-CM

## 2023-07-17 DIAGNOSIS — M25551 Pain in right hip: Secondary | ICD-10-CM

## 2023-07-17 DIAGNOSIS — Z13220 Encounter for screening for lipoid disorders: Secondary | ICD-10-CM | POA: Diagnosis not present

## 2023-07-17 NOTE — Patient Instructions (Signed)
 1. Essential hypertension (Primary)  - CBC - Comprehensive metabolic panel with GFR  2. Lipid screening  - Lipid Panel

## 2023-07-17 NOTE — Progress Notes (Signed)
 Subjective   Patient ID: Christy Price, female    DOB: [birth year], 53 y.o.   MRN: [id number]  Chief Complaint  Patient presents with   Medical Management of Chronic Issues    Referring provider: Jerrlyn Morel, NP  [name] is a 53 y.o. female with Past Medical History: No date: Allergy     Comment:  seasonal No date: Anxiety No date: Arthritis     Comment:  shoulder,knees,feet No date: Back spasm No date: Cancer (HCC)     Comment:   hx cervical cx  No date: Depression No date: GERD (gastroesophageal reflux disease) No date: Hypertension No date: Meningitis No date: Seizures (HCC)     Comment:  greater than 25 years ago   HPI  Patient presents today for a follow-up visit.  She states that overall she is doing well.  She has stopped taking Wellbutrin .  Patient is requesting referral to physical therapy for right hip pain. Denies f/c/s, n/v/d, hemoptysis, PND, leg swelling Denies chest pain or edema.       Allergies  Allergen Reactions   Epinephrine Other (See Comments)   Oxycodone    Rifampin     Immunization History  Administered Date(s) Administered   Influenza, Seasonal, Injecte, Preservative Fre 03/11/2023   Influenza,inj,Quad PF,6+ Mos 11/04/2018, 12/28/2019   PFIZER(Purple Top)SARS-COV-2 Vaccination 06/10/2019, 07/06/2019, 04/07/2020   Tdap 04/09/2017    Tobacco History: Social History   Tobacco Use  Smoking Status Every Day   Current packs/day: 1.50   Types: Cigarettes   Passive exposure: Never  Smokeless Tobacco Never  Tobacco Comments   1 pack a week.   Ready to quit: Yes Counseling given: Yes Tobacco comments: 1 pack a week.   Outpatient Encounter Medications as of 07/17/2023  Medication Sig   Cholecalciferol (D3 PO) Take by mouth.   cyclobenzaprine  (FLEXERIL ) 10 MG tablet Take 1 tablet (10 mg total) by mouth 3 (three) times daily as needed for muscle spasms.   escitalopram  (LEXAPRO ) 10 MG tablet TAKE 1 TABLET  EVERY DAY   furosemide  (LASIX ) 20 MG tablet TAKE 1 TABLET EVERY DAY   hydrochlorothiazide  (HYDRODIURIL ) 25 MG tablet TAKE 1 TABLET EVERY DAY   pantoprazole  (PROTONIX ) 40 MG tablet TAKE 1 TABLET EVERY DAY   terbinafine  (LAMISIL ) 250 MG tablet TAKE 1 TABLET BY MOUTH EVERY DAY   valsartan  (DIOVAN ) 80 MG tablet Take 1 tablet (80 mg total) by mouth daily.   benzonatate  (TESSALON ) 200 MG capsule Take 1 capsule (200 mg total) by mouth 2 (two) times daily as needed for cough. (Patient not taking: Reported on 07/17/2023)   buPROPion  ER (WELLBUTRIN  SR) 100 MG 12 hr tablet TAKE 1 TABLET BY MOUTH TWICE A DAY (Patient not taking: Reported on 07/17/2023)   gabapentin  (NEURONTIN ) 300 MG capsule Take 1 capsule (300 mg total) by mouth 2 (two) times daily for 10 days. (Patient not taking: Reported on 04/17/2023)   hydrOXYzine  (ATARAX /VISTARIL ) 10 MG tablet Take 1 tablet (10 mg total) by mouth 3 (three) times daily as needed. (Patient not taking: Reported on 03/11/2023)   nicotine  polacrilex (NICOTINE  MINI) 2 MG lozenge Take 1 lozenge (2 mg total) by mouth as needed for smoking cessation. (Patient not taking: Reported on 04/17/2023)   zolpidem (AMBIEN) 10 MG tablet Take 10 mg by mouth at bedtime as needed for sleep. (Patient not taking: Reported on 04/17/2023)   No facility-administered encounter medications on file as of 07/17/2023.    Review of Systems  Review of  Systems  Constitutional: Negative.   HENT: Negative.    Cardiovascular: Negative.   Gastrointestinal: Negative.   Allergic/Immunologic: Negative.   Neurological: Negative.   Psychiatric/Behavioral: Negative.       Objective:   BP 134/79   Pulse 64   Temp 97.9 F (36.6 C) (Oral)   Wt 163 lb 6.4 oz (74.1 kg)   SpO2 99%   BMI 33.00 kg/m   Wt Readings from Last 5 Encounters:  07/17/23 163 lb 6.4 oz (74.1 kg)  04/17/23 158 lb (71.7 kg)  11/20/22 155 lb (70.3 kg)  09/20/22 156 lb 12.8 oz (71.1 kg)  07/05/22 154 lb 3.2 oz (69.9 kg)     Physical  Exam Vitals and nursing note reviewed.  Constitutional:      General: She is not in acute distress.    Appearance: She is well-developed.  Cardiovascular:     Rate and Rhythm: Normal rate and regular rhythm.  Pulmonary:     Effort: Pulmonary effort is normal.     Breath sounds: Normal breath sounds.  Musculoskeletal:     Right hip: Tenderness present. Decreased range of motion.  Neurological:     Mental Status: She is alert and oriented to person, place, and time.       Assessment & Plan:   Essential hypertension -     CBC -     Comprehensive metabolic panel with GFR  Lipid screening -     Lipid panel  Right hip pain -     Ambulatory referral to Physical Therapy     Return in about 6 months (around 01/17/2024).   Jerrlyn Morel, NP 07/17/2023

## 2023-07-18 LAB — COMPREHENSIVE METABOLIC PANEL WITH GFR
ALT: 16 IU/L (ref 0–32)
AST: 24 IU/L (ref 0–40)
Albumin: 4.5 g/dL (ref 3.8–4.9)
Alkaline Phosphatase: 76 IU/L (ref 44–121)
BUN/Creatinine Ratio: 26 — ABNORMAL HIGH (ref 9–23)
BUN: 14 mg/dL (ref 6–24)
Bilirubin Total: <0.2 mg/dL (ref 0.0–1.2)
CO2: 23 mmol/L (ref 20–29)
Calcium: 9.3 mg/dL (ref 8.7–10.2)
Chloride: 101 mmol/L (ref 96–106)
Creatinine, Ser: 0.54 mg/dL — ABNORMAL LOW (ref 0.57–1.00)
Globulin, Total: 2.1 g/dL (ref 1.5–4.5)
Glucose: 73 mg/dL (ref 70–99)
Potassium: 4.2 mmol/L (ref 3.5–5.2)
Sodium: 140 mmol/L (ref 134–144)
Total Protein: 6.6 g/dL (ref 6.0–8.5)
eGFR: 109 mL/min/{1.73_m2} (ref >59)

## 2023-07-18 LAB — CBC
Hematocrit: 40.3 % (ref 34.0–46.6)
Hemoglobin: 13.2 g/dL (ref 11.1–15.9)
MCH: 31.1 pg (ref 26.6–33.0)
MCHC: 32.8 g/dL (ref 31.5–35.7)
MCV: 95 fL (ref 79–97)
Platelets: 282 10*3/uL (ref 150–450)
RBC: 4.25 x10E6/uL (ref 3.77–5.28)
RDW: 13 % (ref 11.7–15.4)
WBC: 6.1 10*3/uL (ref 3.4–10.8)

## 2023-07-18 LAB — LIPID PANEL
Chol/HDL Ratio: 2.9 ratio (ref 0.0–4.4)
Cholesterol, Total: 192 mg/dL (ref 100–199)
HDL: 67 mg/dL (ref >39)
LDL Chol Calc (NIH): 113 mg/dL — ABNORMAL HIGH (ref 0–99)
Triglycerides: 65 mg/dL (ref 0–149)
VLDL Cholesterol Cal: 12 mg/dL (ref 5–40)

## 2023-07-30 ENCOUNTER — Other Ambulatory Visit: Payer: Self-pay | Admitting: Nurse Practitioner

## 2023-07-30 DIAGNOSIS — R1012 Left upper quadrant pain: Secondary | ICD-10-CM

## 2023-08-01 ENCOUNTER — Ambulatory Visit: Attending: Nurse Practitioner

## 2023-08-01 DIAGNOSIS — R2689 Other abnormalities of gait and mobility: Secondary | ICD-10-CM | POA: Diagnosis not present

## 2023-08-01 DIAGNOSIS — M6281 Muscle weakness (generalized): Secondary | ICD-10-CM | POA: Diagnosis not present

## 2023-08-01 DIAGNOSIS — M25551 Pain in right hip: Secondary | ICD-10-CM | POA: Diagnosis not present

## 2023-08-01 NOTE — Therapy (Signed)
 OUTPATIENT PHYSICAL THERAPY LOWER EXTREMITY EVALUATION   Patient Name: Christy Price MRN: [id number] DOB:Jan 10, 1971, 52 y.o., female Today's Date: 08/01/2023  END OF SESSION:  PT End of Session - 08/01/23 1152     Visit Number 1    Number of Visits 9    Date for PT Re-Evaluation 09/26/23    Authorization Type [employer] medicare    PT Start Time 1151   patient late   PT Stop Time 1229    PT Time Calculation (min) 38 min    Activity Tolerance Patient tolerated treatment well    Behavior During Therapy WFL for tasks assessed/performed             Past Medical History:  Diagnosis Date   Allergy    seasonal   Anxiety    Arthritis    shoulder,knees,feet   Back spasm    Cancer (HCC)     hx cervical cx    Depression    GERD (gastroesophageal reflux disease)    Hypertension    Meningitis    Seizures (HCC)    greater than 25 years ago   Past Surgical History:  Procedure Laterality Date   BREAST BIOPSY Left 10/30/2010   carpal tunnel Left    LAPAROSCOPIC HYSTERECTOMY     2000   SHUNT REMOVAL     2007   SHUNT REPLACEMENT     x 4   Patient Active Problem List   Diagnosis Date Noted   Lipid screening 09/20/2022   Left upper quadrant abdominal pain 06/07/2022   Anxiety and depression 02/27/2022   Language barrier 05/09/2019   Chronic pain of right knee 05/09/2019   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   Peripheral edema 11/04/2018   Essential hypertension 11/04/2018   Rash 11/04/2018   Pain in both lower legs 12/12/2016   Blepharitis of upper and lower eyelids of both eyes 09/24/2016   Keratoconjunctivitis sicca of both eyes not specified as Sjogren's 09/24/2016   Presbyopia of both eyes 09/24/2016   Vitreous floater, bilateral 09/24/2016   Partial nontraumatic tear of right rotator cuff 04/03/2016   Nail fungus 03/30/2015   Toe pain 03/30/2015   Insomnia 12/19/2014   Pedal edema 11/06/2014   Depression 11/06/2014   Genital herpes 11/06/2014   Back pain  11/06/2014    PCP: Abbey Hobby, NP  REFERRING PROVIDER: Abbey Hobby, NP  REFERRING DIAG: M25.551 (ICD-10-CM) - Right hip pain   THERAPY DIAG:  Pain in right hip - Plan: PT plan of care cert/re-cert  Muscle weakness (generalized) - Plan: PT plan of care cert/re-cert  Other abnormalities of gait and mobility - Plan: PT plan of care cert/re-cert  Rationale for Evaluation and Treatment: Rehabilitation  ONSET DATE: 07/17/23  SUBJECTIVE:   SUBJECTIVE STATEMENT: Patient arrives to clinic without AD. Last year was going to PT on [address] for same issue with good relief, but the pain is back. This pain started ~2 years ago. Denies MOI. Has tried heat and ice, but that doesn't help. Has still been doing her exercises from previous PT and that has not helped. Sometimes pain travels down her leg and sometimes up to her mid low back.   PERTINENT HISTORY: Anxiety, cervical CA, GERD, HTN, meningitis, seizures PAIN:  Are you having pain? Yes: NPRS scale: 5-6/10 Pain location: low back, R hip Pain description: pinching, tingling, sharp Aggravating factors: sitting, mowing the lawn  Relieving factors: walking  PRECAUTIONS: Fall  RED FLAGS: None   WEIGHT BEARING RESTRICTIONS: No  FALLS:  Has patient fallen in last 6 months? No  LIVING ENVIRONMENT: Lives with: lives with their family Lives in: House/apartment Has following equipment at home: Single point cane and Environmental consultant - 2 wheeled  OCCUPATION: waiting on voc rehab  PLOF: Independent  PATIENT GOALS: "to minimize pain"  OBJECTIVE:  Note: Objective measures were completed at Evaluation unless otherwise noted.  DIAGNOSTIC FINDINGS: IMPRESSION: 1. Mild osteoarthritis of the right hip. 2. Mild tendinosis of the right gluteus minimus tendon. Mild peritendinous edema of the right gluteus minimus and medius tendons. 3. Sigmoid diverticulosis.  IMPRESSION: 1. Transitional lumbosacral anatomy as described above. 2. Moderate  facet degenerative disease at L5-S1 is worse on the left. The central canal and foramina are open at this level. 3. Minimal disc bulge and mild facet arthropathy at L4-5 without stenosis.  PATIENT SURVEYS:  Modified Oswestry 22  LEFS  Extreme difficulty/unable (0), Quite a bit of difficulty (1), Moderate difficulty (2), Little difficulty (3), No difficulty (4) Survey date:    Any of your usual work, housework or school activities 2  2. Usual hobbies, recreational or sporting activities 2  3. Getting into/out of the bath 4  4. Walking between rooms 4  5. Putting on socks/shoes 4  6. Squatting  3  7. Lifting an object, like a bag of groceries from the floor 3  8. Performing light activities around your home 3  9. Performing heavy activities around your home 2  10. Getting into/out of a car 2  11. Walking 2 blocks 4  12. Walking 1 mile 3  13. Going up/down 10 stairs (1 flight) 2  14. Standing for 1 hour 1  15.  sitting for 1 hour 1  16. Running on even ground 0  17. Running on uneven ground 0  18. Making sharp turns while running fast 0  19. Hopping  0  20. Rolling over in bed 4  Score total:  42     COGNITION: Overall cognitive status: Within functional limits for tasks assessed     SENSATION: Intermittent "tingling"  POSTURE: rounded shoulders and forward head   GAIT: Distance walked: clinic Assistive device utilized: None Level of assistance: Modified independence Comments: slow gait speed, trendelenburg, antalgic, WBOS                                                                                                                                TREATMENT N/a eval   PATIENT EDUCATION:  Education details: PT POC, exam findings Person educated: Patient Education method: Explanation Education comprehension: verbalized understanding  HOME EXERCISE PROGRAM: To be provided  ASSESSMENT:  CLINICAL IMPRESSION: Patient is a 53 y.o. female who was seen today for  physical therapy evaluation and treatment for R hip and low back pain. This pain has been an ongoing issue for a few years, but has recently flared up. She denies MOI. Given pain referral pattern, PT questions sciatic involvement. Time spent during eval  dictating questionnaires for interpretation. She scored a 22/50 on the Oswestry and 43/80 on the LEFS both indicating a moderate level of disability related to her pain. She would benefit from skilled PT services to address the above mentioned deficits.   OBJECTIVE IMPAIRMENTS: Abnormal gait, decreased knowledge of condition, decreased mobility, decreased strength, impaired sensation, improper body mechanics, postural dysfunction, and pain.   ACTIVITY LIMITATIONS: carrying, lifting, sitting, stairs, locomotion level, and caring for others  PARTICIPATION LIMITATIONS: meal prep, cleaning, laundry, interpersonal relationship, driving, shopping, community activity, occupation, and yard work  PERSONAL FACTORS: Age, Past/current experiences, and Time since onset of injury/illness/exacerbation are also affecting patient's functional outcome.   REHAB POTENTIAL: Fair time since onset  CLINICAL DECISION MAKING: Stable/uncomplicated  EVALUATION COMPLEXITY: Low   GOALS: Goals reviewed with patient? Yes  SHORT TERM GOALS: Target date: 60/20/25 Pt will be independent with initial HEP for improved functional strength and mobility  Baseline: to be provided Goal status: INITIAL  2.  Patient will improve her Oswestry score to </=18 to demonstrate a reduction in her symptoms  Baseline: 22 Goal status: INITIAL  3.  Patient will improve her LEFS score to >/= 52 to demonstrate a reduction in her symptoms Baseline: 43 Goal status: INITIAL  4.  Gait speed goal Baseline: to be assessed Goal status: INITIAL  5.  5x STS goal Baseline: to be assessed  Goal status: INITIAL   LONG TERM GOALS: Target date: 09/26/23  Pt will be independent with final HEP  for improved functional strength and mobility  Baseline: to be provided Goal status: INITIAL  Patient will improve her Oswestry score to </=14 to demonstrate a reduction in her symptoms  Baseline: 22 Goal status: INITIAL  Patient will improve her LEFS score to >/= 61 to demonstrate a reduction in her symptoms Baseline: 43 Goal status: INITIAL  Gait speed goal Baseline: to be assessed Goal status: INITIAL  5x STS goal Baseline: to be assessed  Goal status: INITIAL    6. Patient will be independent with aquatic HEP for improved mobility     Baseline: to be provided    Goal status: INITIAL  PLAN:  PT FREQUENCY: 1x/week  PT DURATION: 8 weeks  PLANNED INTERVENTIONS: 97164- PT Re-evaluation, 97750- Physical Performance Testing, 97110-Therapeutic exercises, 97530- Therapeutic activity, [id number]- Neuromuscular re-education, 97535- Self Care, [id number]- Manual therapy, [id number]- Gait training, [phone]- Orthotic Initial, [phone]- Orthotic/Prosthetic subsequent, [phone]- Aquatic Therapy, [phone]- Electrical stimulation (manual), Patient/Family education, Balance training, Stair training, Taping, Dry Needling, Joint mobilization, Spinal mobilization, Vestibular training, Visual/preceptual remediation/compensation, DME instructions, Cryotherapy, and Moist heat  PLAN FOR NEXT SESSION: HEP, 5xSTS, Gait speed, assess R LE slump for sciatic involvement?    Rebecca Campus, PT Rebecca Campus, PT, DPT, CBIS  08/01/2023, 12:56 PM

## 2023-08-06 ENCOUNTER — Ambulatory Visit

## 2023-08-06 DIAGNOSIS — R2689 Other abnormalities of gait and mobility: Secondary | ICD-10-CM | POA: Diagnosis not present

## 2023-08-06 DIAGNOSIS — M6281 Muscle weakness (generalized): Secondary | ICD-10-CM | POA: Diagnosis not present

## 2023-08-06 DIAGNOSIS — M25551 Pain in right hip: Secondary | ICD-10-CM | POA: Diagnosis not present

## 2023-08-06 NOTE — Therapy (Signed)
 OUTPATIENT PHYSICAL THERAPY LOWER EXTREMITY TREATMENT   Patient Name: Christy Price MRN: [id number] DOB:Jun 20, 1970, 53 y.o., female Today's Date: 08/06/2023  END OF SESSION:  PT End of Session - 08/06/23 1441     Visit Number 2    Number of Visits 9    Date for PT Re-Evaluation 09/26/23    Authorization Type [employer] medicare    PT Start Time 1445    PT Stop Time 1532    PT Time Calculation (min) 47 min    Equipment Utilized During Treatment Gait belt    Activity Tolerance Patient tolerated treatment well    Behavior During Therapy WFL for tasks assessed/performed             Past Medical History:  Diagnosis Date   Allergy    seasonal   Anxiety    Arthritis    shoulder,knees,feet   Back spasm    Cancer (HCC)     hx cervical cx    Depression    GERD (gastroesophageal reflux disease)    Hypertension    Meningitis    Seizures (HCC)    greater than 25 years ago   Past Surgical History:  Procedure Laterality Date   BREAST BIOPSY Left 10/30/2010   carpal tunnel Left    LAPAROSCOPIC HYSTERECTOMY     2000   SHUNT REMOVAL     2007   SHUNT REPLACEMENT     x 4   Patient Active Problem List   Diagnosis Date Noted   Lipid screening 09/20/2022   Left upper quadrant abdominal pain 06/07/2022   Anxiety and depression 02/27/2022   Language barrier 05/09/2019   Chronic pain of right knee 05/09/2019   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   Peripheral edema 11/04/2018   Essential hypertension 11/04/2018   Rash 11/04/2018   Pain in both lower legs 12/12/2016   Blepharitis of upper and lower eyelids of both eyes 09/24/2016   Keratoconjunctivitis sicca of both eyes not specified as Sjogren's 09/24/2016   Presbyopia of both eyes 09/24/2016   Vitreous floater, bilateral 09/24/2016   Partial nontraumatic tear of right rotator cuff 04/03/2016   Nail fungus 03/30/2015   Toe pain 03/30/2015   Insomnia 12/19/2014   Pedal edema 11/06/2014   Depression 11/06/2014   Genital  herpes 11/06/2014   Back pain 11/06/2014    PCP: Abbey Hobby, NP  REFERRING PROVIDER: Abbey Hobby, NP  REFERRING DIAG: M25.551 (ICD-10-CM) - Right hip pain   THERAPY DIAG:  Pain in right hip  Muscle weakness (generalized)  Other abnormalities of gait and mobility  Rationale for Evaluation and Treatment: Rehabilitation  ONSET DATE: 07/17/23  SUBJECTIVE:   SUBJECTIVE STATEMENT: Patient reports doing fair. Did have a strog exacerbation of pain yesterday with a "nervy" pain. Denies falls.   PERTINENT HISTORY: Anxiety, cervical CA, GERD, HTN, meningitis, seizures PAIN:  Are you having pain? Yes: NPRS scale: 8/10 Pain location: low back, R hip Pain description: pinching, tingling, sharp Aggravating factors: sitting, mowing the lawn  Relieving factors: walking  PRECAUTIONS: Fall   PATIENT GOALS: "to minimize pain"  OBJECTIVE:  Note: Objective measures were completed at Evaluation unless otherwise noted.  DIAGNOSTIC FINDINGS: IMPRESSION: 1. Mild osteoarthritis of the right hip. 2. Mild tendinosis of the right gluteus minimus tendon. Mild peritendinous edema of the right gluteus minimus and medius tendons. 3. Sigmoid diverticulosis.  IMPRESSION: 1. Transitional lumbosacral anatomy as described above. 2. Moderate facet degenerative disease at L5-S1 is worse on the left. The central canal and foramina  are open at this level. 3. Minimal disc bulge and mild facet arthropathy at L4-5 without stenosis.                                                                                                                               TREATMENT  [facility] PT Assessment - 08/06/23 0001       Standardized Balance Assessment   Standardized Balance Assessment 10 meter walk test    Five times sit to stand comments  31.69    10 Meter Walk .60m/s           -R LE slump test (+)  Therex: -scifit hills level 3x8 mins B UE/LE -HEP (see below)    PATIENT EDUCATION:  Education  details: PT POC, aquatic PT info, initial land HEP Person educated: Patient Education method: Explanation, Demonstration, and Handouts Education comprehension: verbalized understanding  HOME EXERCISE PROGRAM: Access Code: [email or web address] URL: [email or web address] Date: 08/06/2023 Prepared by: Nickola Baron  Exercises - Supine Bridge  - 1 x daily - 7 x weekly - 3 sets - 10 reps - Clamshell  - 1 x daily - 7 x weekly - 3 sets - 10 reps - Supine Lower Trunk Rotation  - 1 x daily - 7 x weekly - 3 sets - 10 reps - Supine Posterior Pelvic Tilt  - 1 x daily - 7 x weekly - 3 sets - 10 reps - Supine March  - 1 x daily - 7 x weekly - 3 sets - 10 reps - Seated Slump Nerve Glide  - 1 x daily - 7 x weekly - 3 sets - 10 reps  ASSESSMENT:  CLINICAL IMPRESSION: Patient seen for skilled PT session with emphasis on outcome measure assessment and initial HEP. 10 Meter Walk Test: Patient instructed to walk 10 meters (32.8 ft) as quickly and as safely as possible at their normal speed x2 and at a fast speed x2. Time measured from 2 meter mark to 8 meter mark to accommodate ramp-up and ramp-down.  Normal speed:. 50m/s Cut off scores: <0.4 m/s = household Ambulator, 0.4-0.8 m/s = limited community Ambulator, >0.8 m/s = community Ambulator, >1.2 m/s = crossing a street, <1.0 = increased fall risk MCID 0.05 m/s (small), 0.13 m/s (moderate), 0.06 m/s (significant)  (ANPTA Core Set of Outcome Measures for Adults with Neurologic Conditions, 2018). Five times Sit to Stand Test (FTSS) Method: Use a straight back chair with a solid seat that is 17-18" high. Ask participant to sit on the chair with arms folded across their chest.   Instructions: "Stand up and sit down as quickly as possible 5 times, keeping your arms folded across your chest."   Measurement: Stop timing when the participant touches the chair in sitting the 5th time.  TIME: 31.69 sec  Cut off scores indicative of increased fall risk: >12  sec CVA, >16 sec PD, >13 sec vestibular (ANPTA Core Set of Outcome Measures for Adults with Neurologic  Conditions, 2018). Patient tolerated initial HEP well with slight modifications to not lay directly on R hip on hard therapy mat. Continue POC.     OBJECTIVE IMPAIRMENTS: Abnormal gait, decreased knowledge of condition, decreased mobility, decreased strength, impaired sensation, improper body mechanics, postural dysfunction, and pain.   ACTIVITY LIMITATIONS: carrying, lifting, sitting, stairs, locomotion level, and caring for others  PARTICIPATION LIMITATIONS: meal prep, cleaning, laundry, interpersonal relationship, driving, shopping, community activity, occupation, and yard work  PERSONAL FACTORS: Age, Past/current experiences, and Time since onset of injury/illness/exacerbation are also affecting patient's functional outcome.   REHAB POTENTIAL: Fair time since onset  CLINICAL DECISION MAKING: Stable/uncomplicated  EVALUATION COMPLEXITY: Low   GOALS: Goals reviewed with patient? Yes  SHORT TERM GOALS: Target date: 60/20/25 Pt will be independent with initial HEP for improved functional strength and mobility  Baseline: to be provided Goal status: INITIAL  2.  Patient will improve her Oswestry score to </=18 to demonstrate a reduction in her symptoms  Baseline: 22 Goal status: INITIAL  3.  Patient will improve her LEFS score to >/= 52 to demonstrate a reduction in her symptoms Baseline: 43 Goal status: INITIAL  4. Pt will improve gait speed to >/= .28m/s to demonstrate improved community ambulation  Baseline: .86m/s Goal status: REVISED  5.  Pt will improve 5x STS to </= 25 sec to demo improved functional LE strength and balance   Baseline: 31.69s Goal status: REVISED   LONG TERM GOALS: Target date: 09/26/23  Pt will be independent with final HEP for improved functional strength and mobility  Baseline: to be provided Goal status: INITIAL  Patient will improve her  Oswestry score to </=14 to demonstrate a reduction in her symptoms  Baseline: 22 Goal status: INITIAL  Patient will improve her LEFS score to >/= 61 to demonstrate a reduction in her symptoms Baseline: 43 Goal status: INITIAL  Pt will improve gait speed to >/= 1.17m/s to demonstrate improved community ambulation Baseline: .62m/s Goal status: REVISED  Pt will improve 5x STS to </= 20 sec to demo improved functional LE strength and balance   Baseline: 31.69s Goal status: REVISED    6. Patient will be independent with aquatic HEP for improved mobility     Baseline: to be provided    Goal status: INITIAL  PLAN:  PT FREQUENCY: 1x/week  PT DURATION: 8 weeks  PLANNED INTERVENTIONS: 97164- PT Re-evaluation, 97750- Physical Performance Testing, 97110-Therapeutic exercises, 97530- Therapeutic activity, [id number]- Neuromuscular re-education, 97535- Self Care, 56213- Manual therapy, [phone]- Gait training, [phone]- Orthotic Initial, [phone]- Orthotic/Prosthetic subsequent, [phone]- Aquatic Therapy, [phone]- Electrical stimulation (manual), Patient/Family education, Balance training, Stair training, Taping, Dry Needling, Joint mobilization, Spinal mobilization, Vestibular training, Visual/preceptual remediation/compensation, DME instructions, Cryotherapy, and Moist heat  PLAN FOR NEXT SESSION: functional strength, low back mobility, PT believes sciatic involvement    Rebecca Campus, PT Rebecca Campus, PT, DPT, CBIS  08/06/2023, 3:43 PM

## 2023-08-11 ENCOUNTER — Ambulatory Visit: Admitting: Physical Therapy

## 2023-08-14 ENCOUNTER — Ambulatory Visit: Attending: Nurse Practitioner | Admitting: Physical Therapy

## 2023-08-14 DIAGNOSIS — M5459 Other low back pain: Secondary | ICD-10-CM | POA: Insufficient documentation

## 2023-08-14 DIAGNOSIS — M25551 Pain in right hip: Secondary | ICD-10-CM | POA: Diagnosis present

## 2023-08-14 DIAGNOSIS — R2689 Other abnormalities of gait and mobility: Secondary | ICD-10-CM | POA: Insufficient documentation

## 2023-08-14 DIAGNOSIS — M6281 Muscle weakness (generalized): Secondary | ICD-10-CM | POA: Diagnosis present

## 2023-08-18 ENCOUNTER — Ambulatory Visit: Admitting: Physical Therapy

## 2023-08-19 ENCOUNTER — Encounter: Payer: Self-pay | Admitting: Physical Therapy

## 2023-08-19 NOTE — Therapy (Signed)
 OUTPATIENT PHYSICAL THERAPY LOWER EXTREMITY TREATMENT   Patient Name: Christy Price MRN: [id number] DOB:Sep 02, 1970, 53 y.o., female Today's Date: 08/19/2023  END OF SESSION:   08/14/23 0930  PT Visits / Re-Eval  Visit Number 3  Number of Visits 9  Date for PT Re-Evaluation 09/26/23  Authorization  Authorization Type [employer]  PT Time Calculation  PT Start Time 0933  PT Stop Time 1021  PT Time Calculation (min) 45 min  PT - End of Session  Equipment Utilized During Treatment Gait belt  Activity Tolerance Patient tolerated treatment well  Behavior During Therapy WFL for tasks assessed/performed     Past Medical History:  Diagnosis Date   Allergy    seasonal   Anxiety    Arthritis    shoulder,knees,feet   Back spasm    Cancer (HCC)     hx cervical cx    Depression    GERD (gastroesophageal reflux disease)    Hypertension    Meningitis    Seizures (HCC)    greater than 25 years ago   Past Surgical History:  Procedure Laterality Date   BREAST BIOPSY Left 10/30/2010   carpal tunnel Left    LAPAROSCOPIC HYSTERECTOMY     2000   SHUNT REMOVAL     2007   SHUNT REPLACEMENT     x 4   Patient Active Problem List   Diagnosis Date Noted   Lipid screening 09/20/2022   Left upper quadrant abdominal pain 06/07/2022   Anxiety and depression 02/27/2022   Language barrier 05/09/2019   Chronic pain of right knee 05/09/2019   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   Peripheral edema 11/04/2018   Essential hypertension 11/04/2018   Rash 11/04/2018   Pain in both lower legs 12/12/2016   Blepharitis of upper and lower eyelids of both eyes 09/24/2016   Keratoconjunctivitis sicca of both eyes not specified as Sjogren's 09/24/2016   Presbyopia of both eyes 09/24/2016   Vitreous floater, bilateral 09/24/2016   Partial nontraumatic tear of right rotator cuff 04/03/2016   Nail fungus 03/30/2015   Toe pain 03/30/2015   Insomnia 12/19/2014   Pedal edema 11/06/2014    Depression 11/06/2014   Genital herpes 11/06/2014   Back pain 11/06/2014    PCP: Abbey Hobby, NP  REFERRING PROVIDER: Abbey Hobby, NP  REFERRING DIAG: M25.551 (ICD-10-CM) - Right hip pain   THERAPY DIAG:  Pain in right hip  Muscle weakness (generalized)  Other abnormalities of gait and mobility  Rationale for Evaluation and Treatment: Rehabilitation  ONSET DATE: 07/17/23  SUBJECTIVE:   SUBJECTIVE STATEMENT: "Christy Price"  Patient presents with interpreter to [facility] pool.  She reports ongoing nerve pain.  Denies recent falls or acute changes.  Interpreter:  Bridgette Campus  PERTINENT HISTORY: Anxiety, cervical CA, GERD, HTN, meningitis, seizures PAIN:  Are you having pain? Yes: NPRS scale: 2/10 Pain location: low back, R hip Pain description: pinching, tingling, sharp Aggravating factors: sitting, mowing the lawn  Relieving factors: walking  PRECAUTIONS: Fall   PATIENT GOALS: "to minimize pain"  OBJECTIVE:  Note: Objective measures were completed at Evaluation unless otherwise noted.  DIAGNOSTIC FINDINGS: IMPRESSION: 1. Mild osteoarthritis of the right hip. 2. Mild tendinosis of the right gluteus minimus tendon. Mild peritendinous edema of the right gluteus minimus and medius tendons. 3. Sigmoid diverticulosis.  IMPRESSION: 1. Transitional lumbosacral anatomy as described above. 2. Moderate facet degenerative disease at L5-S1 is worse on the left. The central canal and foramina are open at this level. 3. Minimal disc bulge  and mild facet arthropathy at L4-5 without stenosis.                                                                                                                               TREATMENT  Aquatic therapy at [facility] - pool temperature 92 degrees   Patient seen for aquatic therapy today.  Treatment took place in water 3.6-4.8 feet deep depending upon activity.  Patient entered and exited the pool via stairs using reciprocal mobility and rails  for support at SBA level.   Exercises: -Water walking warmup:  4x18 ft forward > laterally > backwards unsupported in shallow water, increased LBP w/ retro-stepping requiring seated rest x2 minutes -Stretching:  Runner's stretch 2x1 minute each > hamstring stretch on step x30 seconds each side then with noodle x1 minute each side, minA for placement, added sciatic nerve glide component with ankle movement x20 each side > Seated SKTC stretch 2x30 seconds each side > seated piriformis stretch w/ minA for LE placement and hold in place x1 minute each side; pt walks 1-2 laps between standing stretches due to symptom flare up  -Standing marches 2x1 minute -Core:  straddle sit on noodle, pt has increased difficulty sitting upright vs swimming forward even with return demo  Patient requires buoyancy of the water for support for reduced fall risk with gait training and balance exercises as well as pain management with no more than minA support for positioning for tasks. Exercises able to be performed safely in water without the risk of fall compared to those same exercises performed on land; viscosity of water needed for resistance for strengthening. Current of water provides perturbations for challenging static and dynamic balance.   PATIENT EDUCATION:  Education details: PT POC, aquatic HEP in future, anatomy, and appts Person educated: Patient Education method: Explanation, Demonstration, and Handouts Education comprehension: verbalized understanding  HOME EXERCISE PROGRAM: Access Code: [email or web address] URL: [email or web address] Date: 08/06/2023 Prepared by: Nickola Baron  Exercises - Supine Bridge  - 1 x daily - 7 x weekly - 3 sets - 10 reps - Clamshell  - 1 x daily - 7 x weekly - 3 sets - 10 reps - Supine Lower Trunk Rotation  - 1 x daily - 7 x weekly - 3 sets - 10 reps - Supine Posterior Pelvic Tilt  - 1 x daily - 7 x weekly - 3 sets - 10 reps - Supine March  - 1 x daily - 7 x  weekly - 3 sets - 10 reps - Seated Slump Nerve Glide  - 1 x daily - 7 x weekly - 3 sets - 10 reps  ASSESSMENT:  CLINICAL IMPRESSION: Pt seen for initial aquatic session this visit.  She has moderately positive pain response, but does require breaking tasks up, especially retro-stepping and intense standing stretching due to symptoms.  She does appear to have potential fluctuating sciatic involvement and has several questions about this vs other congenital issues she  expresses having.  Pt continues to benefit from skilled PT in this and land settings to improve tolerance to activity and general mobility.  Continue per POC.    OBJECTIVE IMPAIRMENTS: Abnormal gait, decreased knowledge of condition, decreased mobility, decreased strength, impaired sensation, improper body mechanics, postural dysfunction, and pain.   ACTIVITY LIMITATIONS: carrying, lifting, sitting, stairs, locomotion level, and caring for others  PARTICIPATION LIMITATIONS: meal prep, cleaning, laundry, interpersonal relationship, driving, shopping, community activity, occupation, and yard work  PERSONAL FACTORS: Age, Past/current experiences, and Time since onset of injury/illness/exacerbation are also affecting patient's functional outcome.   REHAB POTENTIAL: Fair time since onset  CLINICAL DECISION MAKING: Stable/uncomplicated  EVALUATION COMPLEXITY: Low   GOALS: Goals reviewed with patient? Yes  SHORT TERM GOALS: Target date: 60/20/25 Pt will be independent with initial HEP for improved functional strength and mobility  Baseline: to be provided Goal status: INITIAL  2.  Patient will improve her Oswestry score to </=18 to demonstrate a reduction in her symptoms  Baseline: 22 Goal status: INITIAL  3.  Patient will improve her LEFS score to >/= 52 to demonstrate a reduction in her symptoms Baseline: 43 Goal status: INITIAL  4. Pt will improve gait speed to >/= .54m/s to demonstrate improved community  ambulation  Baseline: .66m/s Goal status: REVISED  5.  Pt will improve 5x STS to </= 25 sec to demo improved functional LE strength and balance   Baseline: 31.69s Goal status: REVISED   LONG TERM GOALS: Target date: 09/26/23  Pt will be independent with final HEP for improved functional strength and mobility  Baseline: to be provided Goal status: INITIAL  Patient will improve her Oswestry score to </=14 to demonstrate a reduction in her symptoms  Baseline: 22 Goal status: INITIAL  Patient will improve her LEFS score to >/= 61 to demonstrate a reduction in her symptoms Baseline: 43 Goal status: INITIAL  Pt will improve gait speed to >/= 1.48m/s to demonstrate improved community ambulation Baseline: .85m/s Goal status: REVISED  Pt will improve 5x STS to </= 20 sec to demo improved functional LE strength and balance   Baseline: 31.69s Goal status: REVISED    6. Patient will be independent with aquatic HEP for improved mobility     Baseline: to be provided    Goal status: INITIAL  PLAN:  PT FREQUENCY: 1x/week  PT DURATION: 8 weeks  PLANNED INTERVENTIONS: 97164- PT Re-evaluation, 97750- Physical Performance Testing, 97110-Therapeutic exercises, 97530- Therapeutic activity, [id number]- Neuromuscular re-education, 97535- Self Care, 21308- Manual therapy, [id number]- Gait training, [phone]- Orthotic Initial, [phone]- Orthotic/Prosthetic subsequent, [phone]- Aquatic Therapy, [phone]- Electrical stimulation (manual), Patient/Family education, Balance training, Stair training, Taping, Dry Needling, Joint mobilization, Spinal mobilization, Vestibular training, Visual/preceptual remediation/compensation, DME instructions, Cryotherapy, and Moist heat  PLAN FOR NEXT SESSION: functional strength, low back mobility, PT believes sciatic involvement   Aquatics:  HEP, plank kickbacks, further core on noodle vs DB/noodle UE   Earlean Glaze, PT, DPT  08/19/2023, 8:05 AM

## 2023-08-20 ENCOUNTER — Ambulatory Visit: Admitting: Physical Therapy

## 2023-08-20 DIAGNOSIS — M6281 Muscle weakness (generalized): Secondary | ICD-10-CM | POA: Diagnosis not present

## 2023-08-20 DIAGNOSIS — M25551 Pain in right hip: Secondary | ICD-10-CM

## 2023-08-20 DIAGNOSIS — R2689 Other abnormalities of gait and mobility: Secondary | ICD-10-CM | POA: Diagnosis not present

## 2023-08-20 DIAGNOSIS — M5459 Other low back pain: Secondary | ICD-10-CM

## 2023-08-20 NOTE — Therapy (Signed)
 OUTPATIENT PHYSICAL THERAPY LOWER EXTREMITY TREATMENT   Patient Name: Christy Price MRN: [id number] DOB:17-Oct-1970, 53 y.o., female Today's Date: 08/20/2023  END OF SESSION:  PT End of Session - 08/20/23 1447     Visit Number 4    Number of Visits 9    Date for PT Re-Evaluation 09/26/23    Authorization Type [employer] medicare    PT Start Time 1445    PT Stop Time 1532    PT Time Calculation (min) 47 min    Equipment Utilized During Treatment --    Activity Tolerance Patient tolerated treatment well    Behavior During Therapy WFL for tasks assessed/performed             Past Medical History:  Diagnosis Date   Allergy    seasonal   Anxiety    Arthritis    shoulder,knees,feet   Back spasm    Cancer (HCC)     hx cervical cx    Depression    GERD (gastroesophageal reflux disease)    Hypertension    Meningitis    Seizures (HCC)    greater than 25 years ago   Past Surgical History:  Procedure Laterality Date   BREAST BIOPSY Left 10/30/2010   carpal tunnel Left    LAPAROSCOPIC HYSTERECTOMY     2000   SHUNT REMOVAL     2007   SHUNT REPLACEMENT     x 4   Patient Active Problem List   Diagnosis Date Noted   Lipid screening 09/20/2022   Left upper quadrant abdominal pain 06/07/2022   Anxiety and depression 02/27/2022   Language barrier 05/09/2019   Chronic pain of right knee 05/09/2019   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   Peripheral edema 11/04/2018   Essential hypertension 11/04/2018   Rash 11/04/2018   Pain in both lower legs 12/12/2016   Blepharitis of upper and lower eyelids of both eyes 09/24/2016   Keratoconjunctivitis sicca of both eyes not specified as Sjogren's 09/24/2016   Presbyopia of both eyes 09/24/2016   Vitreous floater, bilateral 09/24/2016   Partial nontraumatic tear of right rotator cuff 04/03/2016   Nail fungus 03/30/2015   Toe pain 03/30/2015   Insomnia 12/19/2014   Pedal edema 11/06/2014   Depression 11/06/2014   Genital herpes  11/06/2014   Back pain 11/06/2014    PCP: Abbey Hobby, NP  REFERRING PROVIDER: Abbey Hobby, NP  REFERRING DIAG: M25.551 (ICD-10-CM) - Right hip pain   THERAPY DIAG:  Pain in right hip  Muscle weakness (generalized)  Other abnormalities of gait and mobility  Other low back pain  Rationale for Evaluation and Treatment: Rehabilitation  ONSET DATE: 07/17/23  SUBJECTIVE:   SUBJECTIVE STATEMENT: Christy Price  Patient presents with interpreter. Reports her low back and L foot has been really bothering her, especially over the past few days. Has been working on her HEP as able. Really enjoyed the pool and would like to continue this.   Interpreter:  Loetta Ringer   PERTINENT HISTORY: Anxiety, cervical CA, GERD, HTN, meningitis, seizures PAIN:  Are you having pain? Yes: NPRS scale: 5/10 Pain location: low back, R hip Pain description: pinching, tingling, sharp Aggravating factors: sitting, mowing the lawn  Relieving factors: walking  PRECAUTIONS: Fall   PATIENT GOALS: to minimize pain  OBJECTIVE:  Note: Objective measures were completed at Evaluation unless otherwise noted.  DIAGNOSTIC FINDINGS: IMPRESSION: 1. Mild osteoarthritis of the right hip. 2. Mild tendinosis of the right gluteus minimus tendon. Mild peritendinous edema of the right gluteus  minimus and medius tendons. 3. Sigmoid diverticulosis.  IMPRESSION: 1. Transitional lumbosacral anatomy as described above. 2. Moderate facet degenerative disease at L5-S1 is worse on the left. The central canal and foramina are open at this level. 3. Minimal disc bulge and mild facet arthropathy at L4-5 without stenosis.                                                                                                                               TREATMENT  Self-care/home management  Discussed PT POC moving forward, as pt would like to do more aquatic than land appointments. Informed pt that next available aquatic appointment  is end of July, but we can continue with land until then. Pt in agreement with this. Encouraged pt to join a local pool and work on her exercises there. Pt reports her mother has a pool she can use, so will plan on providing pt with exercises she can do in the pool in future appointments.  Pt reports new L foot pain that occurs across metatarsals and feels grippy. Assessed L foot and noted swelling along lateral aspect of foot, but no bruising or TTP noted other than at malleoli, which is baseline for pt. Negative anterior drawer and talar tilt test. Informed pt pain could be stemming from overuse of L side due to RLE pain, but will continue to assess in PT. Encouraged pt to use RICE method to assist w/swelling in BLEs and educated pt on where she can obtain compression socks and proper wear schedule.   Ther Act  SciFit multi-peaks level 2.0 for 8 minutes using BUE/BLEs for neural priming for reciprocal movement, dynamic cardiovascular warmup and increased amplitude of stepping. Attempted level 5.5, but pt reported being too heavy. RPE of 1-2/10 following activity and pt frequently stopping to engage in conversation w/interpreter. Cued pt to maintain steps/min > 65, but pt states therapist was messing with her and kept steps/min < 45.  With pt seated at edge of mat and aerobic step placed under feet, seated march overs, x10 reps on LLE and x6 reps on RLE using 12# KB, for improved functional hip strength and core stability. Mod multimodal cues to avoid stepping around KB. Increased difficulty performing on RLE > LLE but no increase in pain reported.  Assessed for leg length discrepancy in supine and noted pt's RLE shorter than LLE. Unsure if this is a fixed discrepancy, pt very TTP along malleoli. Discussed potential to obtain shoe lift for R side and briefly discussed this process, pt reports she would like to think about it.    PATIENT EDUCATION:  Education details: PT POC, leg length discrepancy,  doing some aquatic exercises in the pool.  Person educated: Patient Education method: Explanation, Demonstration, and Verbal cues Education comprehension: verbalized understanding and needs further education  HOME EXERCISE PROGRAM: Access Code: [email or web address] URL: [email or web address] Date: 08/06/2023 Prepared by: Nickola Baron  Exercises - Supine Bridge  - 1 x daily -  7 x weekly - 3 sets - 10 reps - Clamshell  - 1 x daily - 7 x weekly - 3 sets - 10 reps - Supine Lower Trunk Rotation  - 1 x daily - 7 x weekly - 3 sets - 10 reps - Supine Posterior Pelvic Tilt  - 1 x daily - 7 x weekly - 3 sets - 10 reps - Supine March  - 1 x daily - 7 x weekly - 3 sets - 10 reps - Seated Slump Nerve Glide  - 1 x daily - 7 x weekly - 3 sets - 10 reps  ASSESSMENT:  CLINICAL IMPRESSION: Emphasis of skilled PT session on discussing PT POC, functional hip strength and assessing for leg length discrepancy. Pt reports she would like to continue w/aquatics, but no availability until end of July. Pt reports she can use her mom's pool, so will plan on providing her w/aquatic HEP to perform there. Pt demonstrates a leg length discrepancy (RLE shorter than L) but unsure if this is fixed. Will continue to assess in PT and potentially pursue shoe lift in future. Continue POC.    OBJECTIVE IMPAIRMENTS: Abnormal gait, decreased knowledge of condition, decreased mobility, decreased strength, impaired sensation, improper body mechanics, postural dysfunction, and pain.   ACTIVITY LIMITATIONS: carrying, lifting, sitting, stairs, locomotion level, and caring for others  PARTICIPATION LIMITATIONS: meal prep, cleaning, laundry, interpersonal relationship, driving, shopping, community activity, occupation, and yard work  PERSONAL FACTORS: Age, Past/current experiences, and Time since onset of injury/illness/exacerbation are also affecting patient's functional outcome.   REHAB POTENTIAL: Fair time since  onset  CLINICAL DECISION MAKING: Stable/uncomplicated  EVALUATION COMPLEXITY: Low   GOALS: Goals reviewed with patient? Yes  SHORT TERM GOALS: Target date: 08/29/23 Pt will be independent with initial HEP for improved functional strength and mobility  Baseline: to be provided Goal status: INITIAL  2.  Patient will improve her Oswestry score to </=18 to demonstrate a reduction in her symptoms  Baseline: 22 Goal status: INITIAL  3.  Patient will improve her LEFS score to >/= 52 to demonstrate a reduction in her symptoms Baseline: 43 Goal status: INITIAL  4. Pt will improve gait speed to >/= .62m/s to demonstrate improved community ambulation  Baseline: .27m/s Goal status: REVISED  5.  Pt will improve 5x STS to </= 25 sec to demo improved functional LE strength and balance   Baseline: 31.69s Goal status: REVISED   LONG TERM GOALS: Target date: 09/26/23  Pt will be independent with final HEP for improved functional strength and mobility  Baseline: to be provided Goal status: INITIAL  Patient will improve her Oswestry score to </=14 to demonstrate a reduction in her symptoms  Baseline: 22 Goal status: INITIAL  Patient will improve her LEFS score to >/= 61 to demonstrate a reduction in her symptoms Baseline: 43 Goal status: INITIAL  Pt will improve gait speed to >/= 1.33m/s to demonstrate improved community ambulation Baseline: .63m/s Goal status: REVISED  Pt will improve 5x STS to </= 20 sec to demo improved functional LE strength and balance   Baseline: 31.69s Goal status: REVISED    6. Patient will be independent with aquatic HEP for improved mobility     Baseline: to be provided    Goal status: INITIAL  PLAN:  PT FREQUENCY: 1x/week  PT DURATION: 8 weeks  PLANNED INTERVENTIONS: 97164- PT Re-evaluation, 97750- Physical Performance Testing, 97110-Therapeutic exercises, 97530- Therapeutic activity, [id number]- Neuromuscular re-education, 97535- Self Care, 16109-  Manual therapy, [id number]- Gait training,  62130- Orthotic Initial, 86578- Orthotic/Prosthetic subsequent, [id number]- Aquatic Therapy, [phone]- Electrical stimulation (manual), Patient/Family education, Balance training, Stair training, Taping, Dry Needling, Joint mobilization, Spinal mobilization, Vestibular training, Visual/preceptual remediation/compensation, DME instructions, Cryotherapy, and Moist heat  PLAN FOR NEXT SESSION: functional strength, low back mobility, PT believes sciatic involvement, assess for leg length discrepancy again and potentially pursue shoe lift? Maybe try heel wedge in clinic and see how she feels? Pt reporting L foot pain, especially w/eversion, on 6/11.   Aquatics:  Establish HEP that she can do at [employer] pool [facility] can print and laminate next session if needed), plank kickbacks, further core on noodle vs DB/noodle UE   Mariesha Venturella E Vishnu Moeller, PT, DPT  08/20/2023, 3:43 PM

## 2023-08-27 ENCOUNTER — Ambulatory Visit: Admitting: Physical Therapy

## 2023-09-01 ENCOUNTER — Encounter: Payer: Self-pay | Admitting: Physical Therapy

## 2023-09-01 ENCOUNTER — Ambulatory Visit: Admitting: Physical Therapy

## 2023-09-01 DIAGNOSIS — M25551 Pain in right hip: Secondary | ICD-10-CM

## 2023-09-01 DIAGNOSIS — M5459 Other low back pain: Secondary | ICD-10-CM

## 2023-09-01 DIAGNOSIS — M6281 Muscle weakness (generalized): Secondary | ICD-10-CM | POA: Diagnosis not present

## 2023-09-01 DIAGNOSIS — R2689 Other abnormalities of gait and mobility: Secondary | ICD-10-CM | POA: Diagnosis not present

## 2023-09-01 NOTE — Therapy (Signed)
 OUTPATIENT PHYSICAL THERAPY LOWER EXTREMITY TREATMENT   Patient Name: Christy Price MRN: [id number] DOB:Jul 11, 1970, 53 y.o., female Today's Date: 09/01/2023  END OF SESSION:  PT End of Session - 09/01/23 1833     Visit Number 5    Number of Visits 9    Date for PT Re-Evaluation 09/26/23    Authorization Type [employer]are    Authorization Time Period 5-23 - 09-26-23    Authorization - Visit Number 5    Authorization - Number of Visits 9    PT Start Time 1530    PT Stop Time 1620    PT Time Calculation (min) 50 min    Equipment Utilized During Treatment Other (comment)   aquatic noodle, aquatic cuff   Activity Tolerance Patient tolerated treatment well    Behavior During Therapy WFL for tasks assessed/performed          Past Medical History:  Diagnosis Date   Allergy    seasonal   Anxiety    Arthritis    shoulder,knees,feet   Back spasm    Cancer (HCC)     hx cervical cx    Depression    GERD (gastroesophageal reflux disease)    Hypertension    Meningitis    Seizures (HCC)    greater than 25 years ago   Past Surgical History:  Procedure Laterality Date   BREAST BIOPSY Left 10/30/2010   carpal tunnel Left    LAPAROSCOPIC HYSTERECTOMY     2000   SHUNT REMOVAL     2007   SHUNT REPLACEMENT     x 4   Patient Active Problem List   Diagnosis Date Noted   Lipid screening 09/20/2022   Left upper quadrant abdominal pain 06/07/2022   Anxiety and depression 02/27/2022   Language barrier 05/09/2019   Chronic pain of right knee 05/09/2019   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   Peripheral edema 11/04/2018   Essential hypertension 11/04/2018   Rash 11/04/2018   Pain in both lower legs 12/12/2016   Blepharitis of upper and lower eyelids of both eyes 09/24/2016   Keratoconjunctivitis sicca of both eyes not specified as Sjogren's 09/24/2016   Presbyopia of both eyes 09/24/2016   Vitreous floater, bilateral 09/24/2016   Partial nontraumatic tear of right rotator  cuff 04/03/2016   Nail fungus 03/30/2015   Toe pain 03/30/2015   Insomnia 12/19/2014   Pedal edema 11/06/2014   Depression 11/06/2014   Genital herpes 11/06/2014   Back pain 11/06/2014    PCP: Bascom Borer, NP  REFERRING PROVIDER: Bascom Borer, NP  REFERRING DIAG: M25.551 (ICD-10-CM) - Right hip pain   THERAPY DIAG:  Pain in right hip  Other low back pain  Muscle weakness (generalized)  Rationale for Evaluation and Treatment: Rehabilitation  ONSET DATE: 07/17/23  SUBJECTIVE:   SUBJECTIVE STATEMENT: [name]  Pt reports her Rt hip has really been bothering her lately - says she had a sharp pain in her Rt hip yesterday that radiated down the lateral side of her leg - lasted about an hour; pt states she did some exercises and stretches and that relieved it a little  Interpreter: Dagoberto Ned with [employer]  PERTINENT HISTORY: Anxiety, cervical CA, GERD, HTN, meningitis, seizures  PAIN: rating prior to aquatic PT session on 09-01-23  Are you having pain? Yes: NPRS scale: 6/10 Pain location: low back, R hip Pain description: pinching, tingling, sharp Aggravating factors: sitting, mowing the lawn  Relieving factors: walking  PRECAUTIONS: Fall   PATIENT GOALS: to  minimize pain  OBJECTIVE:  Note: Objective measures were completed at Evaluation unless otherwise noted.  DIAGNOSTIC FINDINGS: IMPRESSION: 1. Mild osteoarthritis of the right hip. 2. Mild tendinosis of the right gluteus minimus tendon. Mild peritendinous edema of the right gluteus minimus and medius tendons. 3. Sigmoid diverticulosis.  IMPRESSION: 1. Transitional lumbosacral anatomy as described above. 2. Moderate facet degenerative disease at L5-S1 is worse on the left. The central canal and foramina are open at this level. 3. Minimal disc bulge and mild facet arthropathy at L4-5 without stenosis.                                                                                                                                TREATMENT:  09-01-23  Aquatic therapy at [facility] - pool temperature 90 degrees   Patient seen for aquatic therapy today.  Treatment took place in water 3.6-4.0 feet deep depending upon activity.  Patient entered and exited the pool via stairs using bil. Hand rails with supervision.    Exercises: -Water walking warmup:  4x18 ft forwards, 18' x 4 reps sideways, and 18'x 2 reps backwards -Stretching:  Runner's stretch for RLE - 20 sec hold x 2 reps;  Rt hamstring stretch with Rt foot on pool wall at approx. 50 degrees due to discomfort in Rt hip with this stretch; Rt ITB stretch attempted in standing - pt needed visual and tactile cues for correct positioning for optimal stretch - pt reported moderate tenderness with palpation in middle of lateral thigh - appears to have ITB tightness  -Standing marches 10 reps x 2 sets without UE support  Rt hip ROM, strengthening exercises - bouyant ankle cuff placed on RLE - pt performed Rt hip flexion with knee extended 10 reps, hip abduction 10 reps, and hip extension 10 reps;  Rt hip adduction with pt touching pool wall with Rt foot 10 reps; Rt hip abduction/adduction with knee flexed at 90 degrees 10 reps; Rt hip internal and external rotation 10 reps in standing with knee flexed at 90 degrees  Squats 10 reps bil. LE's;  Rt unilateral squats 10 reps with UE support on pool wall  Pt stood and made circles CW with RLE 10 reps and then CCW 10 reps with RLE for Rt hip ROM; pt stood on RLE and made CW circles 10 reps with LLE for improved SLS on RLE  Rt piriformis stretch in seated position on step in pool - held 20 secs x 1 rep  Patient requires buoyancy of the water for support for reduced fall risk with gait training and balance exercises as well as pain management with no more than minA support for positioning for tasks. Exercises able to be performed safely in water without the risk of fall compared to those same exercises performed on  land; viscosity of water needed for resistance for strengthening. Current of water provides perturbations for challenging static and dynamic balance.   PATIENT EDUCATION:  Education details: Pt instructed to use frozen water bottle to perform deep pressure massage/STM to Rt ITB Person educated: Patient Education method: Explanation, Demonstration, and Handouts Education comprehension: verbalized understanding  HOME EXERCISE PROGRAM: Access Code: [email or web address] URL: [email or web address] Date: 08/06/2023 Prepared by: Delon Pop  Exercises - Supine Bridge  - 1 x daily - 7 x weekly - 3 sets - 10 reps - Clamshell  - 1 x daily - 7 x weekly - 3 sets - 10 reps - Supine Lower Trunk Rotation  - 1 x daily - 7 x weekly - 3 sets - 10 reps - Supine Posterior Pelvic Tilt  - 1 x daily - 7 x weekly - 3 sets - 10 reps - Supine March  - 1 x daily - 7 x weekly - 3 sets - 10 reps - Seated Slump Nerve Glide  - 1 x daily - 7 x weekly - 3 sets - 10 reps  ASSESSMENT:  CLINICAL IMPRESSION: Pt appears to have significant Rt ITB tightness with tenderness with palpation reported in Rt lateral thigh with small lump palpated.  Pt also appears to have Rt piriformis tightness but able to tolerate stretch in seated position on step.  Pt reported discomfort in Rt ankle with backwards ambulation but this subsided with backwards amb. discontinued and pt changing exercises.  Pt required very few rest breaks during session and tolerated exercises very well.  Pt reported reduced low back and Rt hip pain to 3-4/10 at end of session.  Cont per POC.    OBJECTIVE IMPAIRMENTS: Abnormal gait, decreased knowledge of condition, decreased mobility, decreased strength, impaired sensation, improper body mechanics, postural dysfunction, and pain.   ACTIVITY LIMITATIONS: carrying, lifting, sitting, stairs, locomotion level, and caring for others  PARTICIPATION LIMITATIONS: meal prep, cleaning, laundry, interpersonal  relationship, driving, shopping, community activity, occupation, and yard work  PERSONAL FACTORS: Age, Past/current experiences, and Time since onset of injury/illness/exacerbation are also affecting patient's functional outcome.   REHAB POTENTIAL: Fair time since onset  CLINICAL DECISION MAKING: Stable/uncomplicated  EVALUATION COMPLEXITY: Low   GOALS: Goals reviewed with patient? Yes  SHORT TERM GOALS: Target date: 60/20/25  Pt will be independent with initial HEP for improved functional strength and mobility  Baseline: to be provided Goal status:  IN PROGRESS  2.  Patient will improve her Oswestry score to </=18 to demonstrate a reduction in her symptoms  Baseline: 22 Goal status: IN PROGRESS  3.  Patient will improve her LEFS score to >/= 52 to demonstrate a reduction in her symptoms Baseline: 43 Goal status: IN PROGRESS  4. Pt will improve gait speed to >/= .21m/s to demonstrate improved community ambulation  Baseline: .36m/s Goal status: REVISED  5.  Pt will improve 5x STS to </= 25 sec to demo improved functional LE strength and balance   Baseline: 31.69s Goal status: REVISED   LONG TERM GOALS: Target date: 09/26/23  Pt will be independent with final HEP for improved functional strength and mobility  Baseline: to be provided Goal status: INITIAL  Patient will improve her Oswestry score to </=14 to demonstrate a reduction in her symptoms  Baseline: 22 Goal status: INITIAL  Patient will improve her LEFS score to >/= 61 to demonstrate a reduction in her symptoms Baseline: 43 Goal status: INITIAL  Pt will improve gait speed to >/= 1.31m/s to demonstrate improved community ambulation Baseline: .35m/s Goal status: REVISED  Pt will improve 5x STS to </= 20 sec to demo improved functional LE strength and balance  Baseline: 31.69s Goal status: REVISED    6. Patient will be independent with aquatic HEP for improved mobility     Baseline: to be  provided    Goal status: INITIAL  PLAN:  PT FREQUENCY: 1x/week  PT DURATION: 8 weeks  PLANNED INTERVENTIONS: 97164- PT Re-evaluation, 97750- Physical Performance Testing, 97110-Therapeutic exercises, 97530- Therapeutic activity, [id number]- Neuromuscular re-education, 97535- Self Care, [id number]- Manual therapy, [id number]- Gait training, [phone]- Orthotic Initial, [phone]- Orthotic/Prosthetic subsequent, [phone]- Aquatic Therapy, [phone]- Electrical stimulation (manual), Patient/Family education, Balance training, Stair training, Taping, Dry Needling, Joint mobilization, Spinal mobilization, Vestibular training, Visual/preceptual remediation/compensation, DME instructions, Cryotherapy, and Moist heat  PLAN FOR NEXT SESSION: pt requests to switch remaining authorized appts (4) to aquatic only due to relief achieved in water compared to that with land-based exercises  Aquatics:  Rt hip ROM & stretching; ITB and piriformis stretching   Anaise Sterbenz, [place], PT  09/01/2023, 6:40 PM

## 2023-09-03 ENCOUNTER — Ambulatory Visit: Admitting: Physical Therapy

## 2023-09-03 DIAGNOSIS — R2689 Other abnormalities of gait and mobility: Secondary | ICD-10-CM | POA: Diagnosis not present

## 2023-09-03 DIAGNOSIS — M5459 Other low back pain: Secondary | ICD-10-CM | POA: Diagnosis not present

## 2023-09-03 DIAGNOSIS — M25551 Pain in right hip: Secondary | ICD-10-CM

## 2023-09-03 DIAGNOSIS — M6281 Muscle weakness (generalized): Secondary | ICD-10-CM | POA: Diagnosis not present

## 2023-09-04 ENCOUNTER — Encounter: Payer: Self-pay | Admitting: Physical Therapy

## 2023-09-04 NOTE — Therapy (Signed)
 OUTPATIENT PHYSICAL THERAPY LOWER EXTREMITY TREATMENT   Patient Name: Corona Popovich MRN: [id number] DOB:18-Feb-1971, 53 y.o., female Today's Date: 09/04/2023  END OF SESSION:  PT End of Session - 09/04/23 2004     Visit Number 6    Number of Visits 9    Date for PT Re-Evaluation 09/26/23    Authorization Type [employer] medicare    Authorization Time Period 5-23 - 09-26-23    Authorization - Visit Number 6    Authorization - Number of Visits 9    PT Start Time 1450    PT Stop Time 1535    PT Time Calculation (min) 45 min    Equipment Utilized During Treatment Other (comment)   aquatic noodle, aquatic cuff   Activity Tolerance Patient tolerated treatment well    Behavior During Therapy WFL for tasks assessed/performed          Past Medical History:  Diagnosis Date   Allergy    seasonal   Anxiety    Arthritis    shoulder,knees,feet   Back spasm    Cancer (HCC)     hx cervical cx    Depression    GERD (gastroesophageal reflux disease)    Hypertension    Meningitis    Seizures (HCC)    greater than 25 years ago   Past Surgical History:  Procedure Laterality Date   BREAST BIOPSY Left 10/30/2010   carpal tunnel Left    LAPAROSCOPIC HYSTERECTOMY     2000   SHUNT REMOVAL     2007   SHUNT REPLACEMENT     x 4   Patient Active Problem List   Diagnosis Date Noted   Lipid screening 09/20/2022   Left upper quadrant abdominal pain 06/07/2022   Anxiety and depression 02/27/2022   Language barrier 05/09/2019   Chronic pain of right knee 05/09/2019   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   Peripheral edema 11/04/2018   Essential hypertension 11/04/2018   Rash 11/04/2018   Pain in both lower legs 12/12/2016   Blepharitis of upper and lower eyelids of both eyes 09/24/2016   Keratoconjunctivitis sicca of both eyes not specified as Sjogren's 09/24/2016   Presbyopia of both eyes 09/24/2016   Vitreous floater, bilateral 09/24/2016   Partial nontraumatic tear of right rotator  cuff 04/03/2016   Nail fungus 03/30/2015   Toe pain 03/30/2015   Insomnia 12/19/2014   Pedal edema 11/06/2014   Depression 11/06/2014   Genital herpes 11/06/2014   Back pain 11/06/2014    PCP: Bascom Borer, NP  REFERRING PROVIDER: Bascom Borer, NP  REFERRING DIAG: M25.551 (ICD-10-CM) - Right hip pain   THERAPY DIAG:  Other low back pain  Pain in right hip  Other abnormalities of gait and mobility  Rationale for Evaluation and Treatment: Rehabilitation  ONSET DATE: 07/17/23  SUBJECTIVE:   SUBJECTIVE STATEMENT: [name]  Pt reports she had a sharp pain in her Rt low back/hip area on Tuesday; did fine after aquatic therapy on Monday this week.  Pt states she did try the massage with the frozen water bottle and it seemed to help a little  Interpreter: Dagoberto Ned with [employer]  PERTINENT HISTORY: Anxiety, cervical CA, GERD, HTN, meningitis, seizures  PAIN: rating prior to aquatic PT session on 09-01-23  Are you having pain? Yes: NPRS scale: 6/10 Pain location: low back, R hip Pain description: pinching, tingling, sharp Aggravating factors: sitting, mowing the lawn  Relieving factors: walking  PRECAUTIONS: Fall   PATIENT GOALS: to minimize pain  OBJECTIVE:  Note: Objective measures were completed at Evaluation unless otherwise noted.  DIAGNOSTIC FINDINGS: IMPRESSION: 1. Mild osteoarthritis of the right hip. 2. Mild tendinosis of the right gluteus minimus tendon. Mild peritendinous edema of the right gluteus minimus and medius tendons. 3. Sigmoid diverticulosis.  IMPRESSION: 1. Transitional lumbosacral anatomy as described above. 2. Moderate facet degenerative disease at L5-S1 is worse on the left. The central canal and foramina are open at this level. 3. Minimal disc bulge and mild facet arthropathy at L4-5 without stenosis.                                                                                                                                TREATMENT:  09-03-23  Aquatic therapy at [facility] - pool temperature 90 degrees   Patient seen for aquatic therapy today.  Treatment took place in water 3.6-4.0 feet deep depending upon activity.  Patient entered and exited the pool via stairs using bil. Hand rails with supervision.    Exercises: -Water walking warmup:  4x18 ft forwards, 18' x 4 reps sideways, and 18'x 2 reps backwards -Stretching:  Runner's stretch for RLE - 20 sec hold x 2 reps;  Rt hamstring stretch with Rt foot on pool wall at approx. 50 degrees due to discomfort in Rt hip with this stretch; Rt ITB stretch attempted in standing - pt needed visual and tactile cues for correct positioning for optimal stretch - pt reported moderate tenderness with palpation in middle of lateral thigh - appears to have ITB tightness  -Standing marches 10 reps x 2 sets without UE support  Rt hip ROM, strengthening exercises - bouyant ankle cuff placed on RLE - pt performed Rt hip flexion with knee extended 10 reps, hip abduction 10 reps, and hip extension 10 reps;  Rt hip adduction with pt touching pool wall with Rt foot 10 reps; Rt hip abduction/adduction with knee flexed at 90 degrees 10 reps  Squats 10 reps bil. LE's;  Rt unilateral squats 10 reps with UE support on pool wall  Pt stood and made circles CW with RLE 10 reps and then CCW 10 reps with RLE for Rt hip ROM; pt stood on RLE and made CW circles 10 reps with LLE for improved SLS on RLE  Rt piriformis stretch in seated position on step in pool - held 20 secs x 1 rep  Low back stretches - pt sat on bench in pool area- used large noodle for support - leaned forward for trunk flexion for stretch, return to upright sitting x 10 reps  Standing trunk rotation with yellow noodle - 5 reps to each side   Patient requires buoyancy of the water for support for reduced fall risk with gait training and balance exercises as well as pain management with no more than minA support for positioning  for tasks. Exercises able to be performed safely in water without the risk of fall compared to those same exercises performed  on land; viscosity of water needed for resistance for strengthening. Current of water provides perturbations for challenging static and dynamic balance.   PATIENT EDUCATION:  Education details: Pt instructed to use frozen water bottle to perform deep pressure massage/STM to Rt ITB Person educated: Patient Education method: Explanation, Demonstration, and Handouts Education comprehension: verbalized understanding  HOME EXERCISE PROGRAM: Access Code: [email or web address] URL: [email or web address] Date: 08/06/2023 Prepared by: Delon Pop  Exercises - Supine Bridge  - 1 x daily - 7 x weekly - 3 sets - 10 reps - Clamshell  - 1 x daily - 7 x weekly - 3 sets - 10 reps - Supine Lower Trunk Rotation  - 1 x daily - 7 x weekly - 3 sets - 10 reps - Supine Posterior Pelvic Tilt  - 1 x daily - 7 x weekly - 3 sets - 10 reps - Supine March  - 1 x daily - 7 x weekly - 3 sets - 10 reps - Seated Slump Nerve Glide  - 1 x daily - 7 x weekly - 3 sets - 10 reps  ASSESSMENT:  CLINICAL IMPRESSION: Aquatic PT session focused on Rt hip and low back stretching and RLE strengthening with use of aquatic cuff.  Pt reported mild discomfort in low back with some of exercises including backwards ambulation and standing hip extension.  Exercises discontinued with c/o discomfort and pt able to resume other exercises.  Pt reported pain intensity 6/10 at start of session and 5/10 at end of session.  Cont per POC.    OBJECTIVE IMPAIRMENTS: Abnormal gait, decreased knowledge of condition, decreased mobility, decreased strength, impaired sensation, improper body mechanics, postural dysfunction, and pain.   ACTIVITY LIMITATIONS: carrying, lifting, sitting, stairs, locomotion level, and caring for others  PARTICIPATION LIMITATIONS: meal prep, cleaning, laundry, interpersonal relationship,  driving, shopping, community activity, occupation, and yard work  PERSONAL FACTORS: Age, Past/current experiences, and Time since onset of injury/illness/exacerbation are also affecting patient's functional outcome.   REHAB POTENTIAL: Fair time since onset  CLINICAL DECISION MAKING: Stable/uncomplicated  EVALUATION COMPLEXITY: Low   GOALS: Goals reviewed with patient? Yes  SHORT TERM GOALS: Target date: 08/29/23  Pt will be independent with initial HEP for improved functional strength and mobility  Baseline: to be provided Goal status:  IN PROGRESS  2.  Patient will improve her Oswestry score to </=18 to demonstrate a reduction in her symptoms  Baseline: 22 Goal status: IN PROGRESS  3.  Patient will improve her LEFS score to >/= 52 to demonstrate a reduction in her symptoms Baseline: 43 Goal status: IN PROGRESS  4. Pt will improve gait speed to >/= .48m/s to demonstrate improved community ambulation  Baseline: .34m/s Goal status: REVISED  5.  Pt will improve 5x STS to </= 25 sec to demo improved functional LE strength and balance   Baseline: 31.69s Goal status: REVISED   LONG TERM GOALS: Target date: 09/26/23  Pt will be independent with final HEP for improved functional strength and mobility  Baseline: to be provided Goal status: INITIAL  Patient will improve her Oswestry score to </=14 to demonstrate a reduction in her symptoms  Baseline: 22 Goal status: INITIAL  Patient will improve her LEFS score to >/= 61 to demonstrate a reduction in her symptoms Baseline: 43 Goal status: INITIAL  Pt will improve gait speed to >/= 1.69m/s to demonstrate improved community ambulation Baseline: .68m/s Goal status: REVISED  Pt will improve 5x STS to </= 20 sec to demo improved functional LE strength and  balance   Baseline: 31.69s Goal status: REVISED    6. Patient will be independent with aquatic HEP for improved mobility     Baseline: to be provided    Goal status:  INITIAL  PLAN:  PT FREQUENCY: 1x/week  PT DURATION: 8 weeks  PLANNED INTERVENTIONS: 97164- PT Re-evaluation, 97750- Physical Performance Testing, 97110-Therapeutic exercises, 97530- Therapeutic activity, [id number]- Neuromuscular re-education, 97535- Self Care, [id number]- Manual therapy, [id number]- Gait training, [phone]- Orthotic Initial, [phone]- Orthotic/Prosthetic subsequent, [phone]- Aquatic Therapy, [phone]- Electrical stimulation (manual), Patient/Family education, Balance training, Stair training, Taping, Dry Needling, Joint mobilization, Spinal mobilization, Vestibular training, Visual/preceptual remediation/compensation, DME instructions, Cryotherapy, and Moist heat  PLAN FOR NEXT SESSION: pt requests to switch land appts to aquatic appts.  Aquatics:  Rt hip ROM & stretching; ITB and piriformis stretching   Brandelyn Henne, [place], PT  09/04/2023, 8:09 PM

## 2023-09-09 ENCOUNTER — Ambulatory Visit: Admitting: Physical Therapy

## 2023-09-10 ENCOUNTER — Ambulatory Visit: Attending: Nurse Practitioner | Admitting: Physical Therapy

## 2023-09-10 DIAGNOSIS — M5459 Other low back pain: Secondary | ICD-10-CM | POA: Insufficient documentation

## 2023-09-10 DIAGNOSIS — M6281 Muscle weakness (generalized): Secondary | ICD-10-CM | POA: Insufficient documentation

## 2023-09-10 DIAGNOSIS — R2689 Other abnormalities of gait and mobility: Secondary | ICD-10-CM | POA: Insufficient documentation

## 2023-09-10 DIAGNOSIS — M25551 Pain in right hip: Secondary | ICD-10-CM | POA: Insufficient documentation

## 2023-09-11 ENCOUNTER — Ambulatory Visit: Admitting: Physical Therapy

## 2023-09-11 ENCOUNTER — Encounter: Payer: Self-pay | Admitting: Physical Therapy

## 2023-09-11 NOTE — Therapy (Signed)
 OUTPATIENT PHYSICAL THERAPY LOWER EXTREMITY TREATMENT   Patient Name: Sharicka Pogorzelski MRN: [id number] DOB:03/15/1970, 53 y.o., female Today's Date: 09/11/2023  END OF SESSION:  PT End of Session - 09/11/23 1937     Visit Number 7    Number of Visits 9    Date for PT Re-Evaluation 09/26/23    Authorization Type [employer]    Authorization Time Period 5-23 - 09-26-23    Authorization - Visit Number 7    Authorization - Number of Visits 9    PT Start Time 1445    PT Stop Time 1530    PT Time Calculation (min) 45 min    Equipment Utilized During Treatment Other (comment)   aquatic noodle, aquatic cuff, barbells   Activity Tolerance Patient tolerated treatment well    Behavior During Therapy WFL for tasks assessed/performed          Past Medical History:  Diagnosis Date   Allergy    seasonal   Anxiety    Arthritis    shoulder,knees,feet   Back spasm    Cancer (HCC)     hx cervical cx    Depression    GERD (gastroesophageal reflux disease)    Hypertension    Meningitis    Seizures (HCC)    greater than 25 years ago   Past Surgical History:  Procedure Laterality Date   BREAST BIOPSY Left 10/30/2010   carpal tunnel Left    LAPAROSCOPIC HYSTERECTOMY     2000   SHUNT REMOVAL     2007   SHUNT REPLACEMENT     x 4   Patient Active Problem List   Diagnosis Date Noted   Lipid screening 09/20/2022   Left upper quadrant abdominal pain 06/07/2022   Anxiety and depression 02/27/2022   Language barrier 05/09/2019   Chronic pain of right knee 05/09/2019   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   Peripheral edema 11/04/2018   Essential hypertension 11/04/2018   Rash 11/04/2018   Pain in both lower legs 12/12/2016   Blepharitis of upper and lower eyelids of both eyes 09/24/2016   Keratoconjunctivitis sicca of both eyes not specified as Sjogren's 09/24/2016   Presbyopia of both eyes 09/24/2016   Vitreous floater, bilateral 09/24/2016   Partial nontraumatic tear of right  rotator cuff 04/03/2016   Nail fungus 03/30/2015   Toe pain 03/30/2015   Insomnia 12/19/2014   Pedal edema 11/06/2014   Depression 11/06/2014   Genital herpes 11/06/2014   Back pain 11/06/2014    PCP: Bascom Borer, NP  REFERRING PROVIDER: Bascom Borer, NP  REFERRING DIAG: M25.551 (ICD-10-CM) - Right hip pain   THERAPY DIAG:  Pain in right hip  Other low back pain  Other abnormalities of gait and mobility  Rationale for Evaluation and Treatment: Rehabilitation  ONSET DATE: 07/17/23  SUBJECTIVE:   SUBJECTIVE STATEMENT: [name]  Pt reports she had a pain in her Rt hip when she was doing water exercises in her Mom's pool - was sharp -  lasted for several seconds and then subsided  Interpreter: Richardson  PERTINENT HISTORY: Anxiety, cervical CA, GERD, HTN, meningitis, seizures  PAIN: rating prior to aquatic PT session on 09-10-23  Are you having pain? Yes: NPRS scale: 5/10 Pain location: low back, R hip Pain description: pinching, tingling, sharp Aggravating factors: sitting, mowing the lawn  Relieving factors: walking  PRECAUTIONS: Fall   PATIENT GOALS: to minimize pain  OBJECTIVE:  Note: Objective measures were completed at Evaluation unless otherwise noted.  DIAGNOSTIC FINDINGS:  IMPRESSION: 1. Mild osteoarthritis of the right hip. 2. Mild tendinosis of the right gluteus minimus tendon. Mild peritendinous edema of the right gluteus minimus and medius tendons. 3. Sigmoid diverticulosis.  IMPRESSION: 1. Transitional lumbosacral anatomy as described above. 2. Moderate facet degenerative disease at L5-S1 is worse on the left. The central canal and foramina are open at this level. 3. Minimal disc bulge and mild facet arthropathy at L4-5 without stenosis.                                                                                                                               TREATMENT:  09-10-23  Aquatic therapy at [facility] - pool temperature 90  degrees   Patient seen for aquatic therapy today.  Treatment took place in water 3.6-4.0 feet deep depending upon activity.  Patient entered and exited the pool via stairs using bil. Hand rails with supervision.    Exercises: -Water walking warmup:  4x18 ft forwards, 18' x 4 reps sideways, and 18'x 2 reps backwards -Stretching:  Runner's stretch for RLE - 20 sec hold x 2 reps;  Rt hamstring stretch with Rt foot on pool wall at approx. 50 degrees due to discomfort in Rt hip with this stretch; Rt ITB stretch attempted in standing - pt needed visual and tactile cues for correct positioning for optimal stretch - pt reported moderate tenderness with palpation in middle of lateral thigh - appears to have ITB tightness  -Standing marches 10 reps without UE support  Rt hip ROM, strengthening exercises - bouyant ankle cuff placed on RLE - pt performed Rt hip flexion with knee extended 10 reps, hip abduction 10 reps, and hip extension 10 reps;  Rt hip adduction with pt touching pool wall with Rt foot 10 reps; Rt hip abduction/adduction with knee flexed at 90 degrees 10 reps  Squats 10 reps bil. LE's;  Rt unilateral squats 10 reps with UE support on pool wall  Pt stood and made circles CW with RLE 10 reps and then CCW 10 reps with RLE for Rt hip ROM; pt stood on RLE and made CW circles 10 reps with LLE for improved SLS on RLE  Rt piriformis stretch in seated position on step in pool - held 20 secs x 1 rep  Standing trunk rotation with yellow noodle - 5 reps to each side   Patient requires buoyancy of the water for support for reduced fall risk with gait training and balance exercises as well as pain management with no more than minA support for positioning for tasks. Exercises able to be performed safely in water without the risk of fall compared to those same exercises performed on land; viscosity of water needed for resistance for strengthening. Current of water provides perturbations for challenging  static and dynamic balance.   PATIENT EDUCATION:  Education details: Pt instructed to use frozen water bottle to perform deep pressure massage/STM to Rt ITB Person educated: Patient Education method:  Explanation, Demonstration, and Handouts Education comprehension: verbalized understanding  HOME EXERCISE PROGRAM: Access Code: [email or web address] URL: [email or web address] Date: 08/06/2023 Prepared by: Delon Pop  Exercises - Supine Bridge  - 1 x daily - 7 x weekly - 3 sets - 10 reps - Clamshell  - 1 x daily - 7 x weekly - 3 sets - 10 reps - Supine Lower Trunk Rotation  - 1 x daily - 7 x weekly - 3 sets - 10 reps - Supine Posterior Pelvic Tilt  - 1 x daily - 7 x weekly - 3 sets - 10 reps - Supine March  - 1 x daily - 7 x weekly - 3 sets - 10 reps - Seated Slump Nerve Glide  - 1 x daily - 7 x weekly - 3 sets - 10 reps  ASSESSMENT:  CLINICAL IMPRESSION: Aquatic PT session focused on Rt hip and low back stretching and RLE strengthening with use of aquatic cuff.  Pt reported relief with standing trunk rotation with use of noodle.  Pt reports aquatic exercises seem to be helping to reduce back pain.  Pt is progressing well towards goals.  Cont with POC.    OBJECTIVE IMPAIRMENTS: Abnormal gait, decreased knowledge of condition, decreased mobility, decreased strength, impaired sensation, improper body mechanics, postural dysfunction, and pain  ACTIVITY LIMITATIONS: carrying, lifting, sitting, stairs, locomotion level, and caring for others  PARTICIPATION LIMITATIONS: meal prep, cleaning, laundry, interpersonal relationship, driving, shopping, community activity, occupation, and yard work  PERSONAL FACTORS: Age, Past/current experiences, and Time since onset of injury/illness/exacerbation are also affecting patient's functional outcome.   REHAB POTENTIAL: Fair time since onset  CLINICAL DECISION MAKING: Stable/uncomplicated  EVALUATION COMPLEXITY: Low   GOALS: Goals reviewed  with patient? Yes  SHORT TERM GOALS: Target date: 08/29/23  Pt will be independent with initial HEP for improved functional strength and mobility  Baseline: to be provided Goal status:  IN PROGRESS  2.  Patient will improve her Oswestry score to </=18 to demonstrate a reduction in her symptoms  Baseline: 22 Goal status: IN PROGRESS  3.  Patient will improve her LEFS score to >/= 52 to demonstrate a reduction in her symptoms Baseline: 43 Goal status: IN PROGRESS  4. Pt will improve gait speed to >/= .70m/s to demonstrate improved community ambulation  Baseline: .36m/s Goal status: REVISED  5.  Pt will improve 5x STS to </= 25 sec to demo improved functional LE strength and balance   Baseline: 31.69s Goal status: REVISED   LONG TERM GOALS: Target date: 09/26/23  Pt will be independent with final HEP for improved functional strength and mobility  Baseline: to be provided Goal status: INITIAL  Patient will improve her Oswestry score to </=14 to demonstrate a reduction in her symptoms  Baseline: 22 Goal status: INITIAL  Patient will improve her LEFS score to >/= 61 to demonstrate a reduction in her symptoms Baseline: 43 Goal status: INITIAL  Pt will improve gait speed to >/= 1.23m/s to demonstrate improved community ambulation Baseline: .69m/s Goal status: REVISED  Pt will improve 5x STS to </= 20 sec to demo improved functional LE strength and balance   Baseline: 31.69s Goal status: REVISED    6. Patient will be independent with aquatic HEP for improved mobility     Baseline: to be provided    Goal status: INITIAL  PLAN:  PT FREQUENCY: 1x/week  PT DURATION: 8 weeks  PLANNED INTERVENTIONS: 97164- PT Re-evaluation, 97750- Physical Performance Testing, 97110-Therapeutic exercises, 97530- Therapeutic activity, [id number]- Neuromuscular re-education,  02464- Self Care, 02859- Manual therapy, [id number]- Gait training, [phone]- Orthotic Initial, [phone]- Orthotic/Prosthetic  subsequent, [phone]- Aquatic Therapy, [phone]- Electrical stimulation (manual), Patient/Family education, Balance training, Stair training, Taping, Dry Needling, Joint mobilization, Spinal mobilization, Vestibular training, Visual/preceptual remediation/compensation, DME instructions, Cryotherapy, and Moist heat  PLAN FOR NEXT SESSION: pt requests to switch land appts to aquatic appts.  Aquatics:  Rt hip ROM & stretching; ITB and piriformis stretching   Kylian Loh, [place], PT  09/11/2023, 7:43 PM

## 2023-09-16 ENCOUNTER — Ambulatory Visit: Admitting: Physical Therapy

## 2023-09-17 ENCOUNTER — Ambulatory Visit: Admitting: Physical Therapy

## 2023-09-17 DIAGNOSIS — M5459 Other low back pain: Secondary | ICD-10-CM

## 2023-09-17 DIAGNOSIS — M25551 Pain in right hip: Secondary | ICD-10-CM | POA: Diagnosis not present

## 2023-09-17 DIAGNOSIS — R2689 Other abnormalities of gait and mobility: Secondary | ICD-10-CM | POA: Diagnosis not present

## 2023-09-17 DIAGNOSIS — M6281 Muscle weakness (generalized): Secondary | ICD-10-CM | POA: Diagnosis not present

## 2023-09-18 ENCOUNTER — Encounter: Payer: Self-pay | Admitting: Physical Therapy

## 2023-09-18 NOTE — Therapy (Signed)
 OUTPATIENT PHYSICAL THERAPY LOWER EXTREMITY TREATMENT   Patient Name: Christy Price MRN: [id number] DOB:20-Feb-1971, 53 y.o., female Today's Date: 09/18/2023  END OF SESSION:  PT End of Session - 09/18/23 2017     Visit Number 8    Number of Visits 9    Date for PT Re-Evaluation 09/26/23    Authorization Type [employer] medicare    Authorization Time Period 5-23 - 09-26-23    Authorization - Visit Number 8    Authorization - Number of Visits 9    PT Start Time 1450    PT Stop Time 1535    PT Time Calculation (min) 45 min    Equipment Utilized During Treatment Other (comment)   aquatic noodle, aquatic cuff, barbells   Activity Tolerance Patient tolerated treatment well    Behavior During Therapy WFL for tasks assessed/performed          Past Medical History:  Diagnosis Date   Allergy    seasonal   Anxiety    Arthritis    shoulder,knees,feet   Back spasm    Cancer (HCC)     hx cervical cx    Depression    GERD (gastroesophageal reflux disease)    Hypertension    Meningitis    Seizures (HCC)    greater than 25 years ago   Past Surgical History:  Procedure Laterality Date   BREAST BIOPSY Left 10/30/2010   carpal tunnel Left    LAPAROSCOPIC HYSTERECTOMY     2000   SHUNT REMOVAL     2007   SHUNT REPLACEMENT     x 4   Patient Active Problem List   Diagnosis Date Noted   Lipid screening 09/20/2022   Left upper quadrant abdominal pain 06/07/2022   Anxiety and depression 02/27/2022   Language barrier 05/09/2019   Chronic pain of right knee 05/09/2019   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   Peripheral edema 11/04/2018   Essential hypertension 11/04/2018   Rash 11/04/2018   Pain in both lower legs 12/12/2016   Blepharitis of upper and lower eyelids of both eyes 09/24/2016   Keratoconjunctivitis sicca of both eyes not specified as Sjogren's 09/24/2016   Presbyopia of both eyes 09/24/2016   Vitreous floater, bilateral 09/24/2016   Partial nontraumatic tear of right  rotator cuff 04/03/2016   Nail fungus 03/30/2015   Toe pain 03/30/2015   Insomnia 12/19/2014   Pedal edema 11/06/2014   Depression 11/06/2014   Genital herpes 11/06/2014   Back pain 11/06/2014    PCP: Bascom Borer, NP  REFERRING PROVIDER: Bascom Borer, NP  REFERRING DIAG: M25.551 (ICD-10-CM) - Right hip pain   THERAPY DIAG:  Pain in right hip  Other low back pain  Other abnormalities of gait and mobility  Rationale for Evaluation and Treatment: Rehabilitation  ONSET DATE: 07/17/23  SUBJECTIVE:   SUBJECTIVE STATEMENT: [name]  Pt reports she continues to have intermittent sharp pains in her Rt hip - pt points to Rt buttock muscle - tenderness reported with palpation of piriformis muscle by PT   Interpreter: Richardson  PERTINENT HISTORY: Anxiety, cervical CA, GERD, HTN, meningitis, seizures  PAIN: rating prior to aquatic PT session on 09-10-23  Are you having pain? Yes: NPRS scale: 5/10 Pain location: low back, R hip Pain description: pinching, tingling, sharp Aggravating factors: sitting, mowing the lawn  Relieving factors: walking  PRECAUTIONS: Fall   PATIENT GOALS: to minimize pain  OBJECTIVE:  Note: Objective measures were completed at Evaluation unless otherwise noted.  DIAGNOSTIC FINDINGS: IMPRESSION:  1. Mild osteoarthritis of the right hip. 2. Mild tendinosis of the right gluteus minimus tendon. Mild peritendinous edema of the right gluteus minimus and medius tendons. 3. Sigmoid diverticulosis.  IMPRESSION: 1. Transitional lumbosacral anatomy as described above. 2. Moderate facet degenerative disease at L5-S1 is worse on the left. The central canal and foramina are open at this level. 3. Minimal disc bulge and mild facet arthropathy at L4-5 without stenosis.                                                                                                                               TREATMENT:  09-17-23  Aquatic therapy at [facility] - pool  temperature 90 degrees   Patient seen for aquatic therapy today.  Treatment took place in water 3.6-4.0 feet deep depending upon activity.  Patient entered and exited the pool via stairs using bil. Hand rails with supervision.    Exercises: -Water walking warmup:  4x18 ft forwards, 18' x 4 reps sideways, and 18'x 2 reps backwards -Stretching:  Runner's stretch for RLE - 20 sec hold x 2 reps;  Rt hamstring stretch with Rt foot on pool wall at approx. 50 degrees due to discomfort in Rt hip with this stretch; Rt ITB stretch attempted in standing - pt needed visual and tactile cues for correct positioning for optimal stretch - pt reported moderate tenderness with palpation in middle of lateral thigh - appears to have ITB tightness  -Standing marches 10 reps without UE support  Rt hip ROM, strengthening exercises - bouyant ankle cuff placed on RLE - pt performed Rt hip flexion with knee extended 10 reps, hip abduction 10 reps, and hip extension 10 reps;  Rt hip adduction with pt touching pool wall with Rt foot 10 reps; Rt hip abduction/adduction with knee flexed at 90 degrees 10 reps  Squats 10 reps bil. LE's;  Rt unilateral squats 10 reps with UE support on pool wall  Pt stood and made circles CW with RLE 10 reps and then CCW 10 reps with RLE for Rt hip ROM; pt stood on RLE and made CW circles 10 reps with LLE for improved SLS on RLE  Rt piriformis stretch in seated position on step in pool - held 20 secs x 1 rep Rt piriformis stretch in standing position against pool wall - min assist to flex and adduct RLE for piriformis stretch - 1 rep - 10 sec hold  Patient requires buoyancy of the water for support for reduced fall risk with gait training and balance exercises as well as pain management with no more than minA support for positioning for tasks. Exercises able to be performed safely in water without the risk of fall compared to those same exercises performed on land; viscosity of water needed for  resistance for strengthening. Current of water provides perturbations for challenging static and dynamic balance.   PATIENT EDUCATION:  Education details: Pt instructed to use frozen water bottle to  perform deep pressure massage/STM to Rt ITB Person educated: Patient Education method: Explanation, Demonstration, and Handouts Education comprehension: verbalized understanding  HOME EXERCISE PROGRAM: Access Code: [email or web address] URL: [email or web address] Date: 08/06/2023 Prepared by: Delon Pop  Exercises - Supine Bridge  - 1 x daily - 7 x weekly - 3 sets - 10 reps - Clamshell  - 1 x daily - 7 x weekly - 3 sets - 10 reps - Supine Lower Trunk Rotation  - 1 x daily - 7 x weekly - 3 sets - 10 reps - Supine Posterior Pelvic Tilt  - 1 x daily - 7 x weekly - 3 sets - 10 reps - Supine March  - 1 x daily - 7 x weekly - 3 sets - 10 reps - Seated Slump Nerve Glide  - 1 x daily - 7 x weekly - 3 sets - 10 reps  ASSESSMENT:  CLINICAL IMPRESSION: Aquatic PT session focused on Rt hip and low back stretching and RLE strengthening with use of aquatic cuff.  Pt reported moderate pain with deep palpation of Rt piriformis region.  Pt reported moderate discomfort with Rt piriformis stretches with standing position more effective than seated position for piriformis stretch.  Pt tolerated aquatic exercises well overall.  Cont with POC.    OBJECTIVE IMPAIRMENTS: Abnormal gait, decreased knowledge of condition, decreased mobility, decreased strength, impaired sensation, improper body mechanics, postural dysfunction, and pain  ACTIVITY LIMITATIONS: carrying, lifting, sitting, stairs, locomotion level, and caring for others  PARTICIPATION LIMITATIONS: meal prep, cleaning, laundry, interpersonal relationship, driving, shopping, community activity, occupation, and yard work  PERSONAL FACTORS: Age, Past/current experiences, and Time since onset of injury/illness/exacerbation are also affecting patient's  functional outcome.   REHAB POTENTIAL: Fair time since onset  CLINICAL DECISION MAKING: Stable/uncomplicated  EVALUATION COMPLEXITY: Low   GOALS: Goals reviewed with patient? Yes  SHORT TERM GOALS: Target date: 08/29/23  Pt will be independent with initial HEP for improved functional strength and mobility  Baseline: to be provided Goal status:  IN PROGRESS  2.  Patient will improve her Oswestry score to </=18 to demonstrate a reduction in her symptoms  Baseline: 22 Goal status: IN PROGRESS  3.  Patient will improve her LEFS score to >/= 52 to demonstrate a reduction in her symptoms Baseline: 43 Goal status: IN PROGRESS  4. Pt will improve gait speed to >/= .50m/s to demonstrate improved community ambulation  Baseline: .33m/s Goal status: REVISED  5.  Pt will improve 5x STS to </= 25 sec to demo improved functional LE strength and balance   Baseline: 31.69s Goal status: REVISED   LONG TERM GOALS: Target date: 09/26/23  Pt will be independent with final HEP for improved functional strength and mobility  Baseline: to be provided Goal status: INITIAL  Patient will improve her Oswestry score to </=14 to demonstrate a reduction in her symptoms  Baseline: 22 Goal status: INITIAL  Patient will improve her LEFS score to >/= 61 to demonstrate a reduction in her symptoms Baseline: 43 Goal status: INITIAL  Pt will improve gait speed to >/= 1.50m/s to demonstrate improved community ambulation Baseline: .63m/s Goal status: REVISED  Pt will improve 5x STS to </= 20 sec to demo improved functional LE strength and balance   Baseline: 31.69s Goal status: REVISED    6. Patient will be independent with aquatic HEP for improved mobility     Baseline: to be provided    Goal status: INITIAL  PLAN:  PT FREQUENCY: 1x/week  PT DURATION: 8  weeks  PLANNED INTERVENTIONS: 97164- PT Re-evaluation, 97750- Physical Performance Testing, 97110-Therapeutic exercises, 97530-  Therapeutic activity, [id number]- Neuromuscular re-education, 97535- Self Care, 02859- Manual therapy, [phone]- Gait training, [phone]- Orthotic Initial, [phone]- Orthotic/Prosthetic subsequent, [phone]- Aquatic Therapy, [phone]- Electrical stimulation (manual), Patient/Family education, Balance training, Stair training, Taping, Dry Needling, Joint mobilization, Spinal mobilization, Vestibular training, Visual/preceptual remediation/compensation, DME instructions, Cryotherapy, and Moist heat  PLAN FOR NEXT SESSION: pt requests to switch land appts to aquatic appts.  Aquatics:  Rt hip ROM & stretching; ITB and piriformis stretching   Zenith Kercheval, [place], PT  09/18/2023, 8:24 PM

## 2023-09-22 ENCOUNTER — Other Ambulatory Visit: Payer: Self-pay | Admitting: Nurse Practitioner

## 2023-09-22 DIAGNOSIS — Z1231 Encounter for screening mammogram for malignant neoplasm of breast: Secondary | ICD-10-CM

## 2023-09-23 ENCOUNTER — Ambulatory Visit: Admitting: Physical Therapy

## 2023-09-24 ENCOUNTER — Ambulatory Visit: Admitting: Physical Therapy

## 2023-09-24 DIAGNOSIS — R2689 Other abnormalities of gait and mobility: Secondary | ICD-10-CM | POA: Diagnosis not present

## 2023-09-24 DIAGNOSIS — M5459 Other low back pain: Secondary | ICD-10-CM

## 2023-09-24 DIAGNOSIS — M6281 Muscle weakness (generalized): Secondary | ICD-10-CM | POA: Diagnosis not present

## 2023-09-24 DIAGNOSIS — M25551 Pain in right hip: Secondary | ICD-10-CM | POA: Diagnosis not present

## 2023-09-25 ENCOUNTER — Encounter: Payer: Self-pay | Admitting: Physical Therapy

## 2023-09-25 NOTE — Therapy (Signed)
 OUTPATIENT PHYSICAL THERAPY LOWER EXTREMITY TREATMENT/RE-CERTIFICATION   Patient Name: Christy Price MRN: [id number] DOB:31-Mar-1970, 53 y.o., female Today's Date: 09/25/2023  END OF SESSION:  PT End of Session - 09/25/23 2008     Visit Number 9    Number of Visits 9    Date for PT Re-Evaluation 09/26/23    Authorization Type [employer]    Authorization Time Period 5-23 - 09-26-23    Authorization - Visit Number 9    Authorization - Number of Visits 9   4 additional aquatic visits requested   Progress Note Due on Visit 10    PT Start Time 1445    PT Stop Time 1530    PT Time Calculation (min) 45 min    Equipment Utilized During Treatment Other (comment)   aquatic cuff,  barbells   Activity Tolerance Patient tolerated treatment well    Behavior During Therapy WFL for tasks assessed/performed          Past Medical History:  Diagnosis Date   Allergy    seasonal   Anxiety    Arthritis    shoulder,knees,feet   Back spasm    Cancer (HCC)     hx cervical cx    Depression    GERD (gastroesophageal reflux disease)    Hypertension    Meningitis    Seizures (HCC)    greater than 25 years ago   Past Surgical History:  Procedure Laterality Date   BREAST BIOPSY Left 10/30/2010   carpal tunnel Left    LAPAROSCOPIC HYSTERECTOMY     2000   SHUNT REMOVAL     2007   SHUNT REPLACEMENT     x 4   Patient Active Problem List   Diagnosis Date Noted   Lipid screening 09/20/2022   Left upper quadrant abdominal pain 06/07/2022   Anxiety and depression 02/27/2022   Language barrier 05/09/2019   Chronic pain of right knee 05/09/2019   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   Peripheral edema 11/04/2018   Essential hypertension 11/04/2018   Rash 11/04/2018   Pain in both lower legs 12/12/2016   Blepharitis of upper and lower eyelids of both eyes 09/24/2016   Keratoconjunctivitis sicca of both eyes not specified as Sjogren's 09/24/2016   Presbyopia of both eyes 09/24/2016    Vitreous floater, bilateral 09/24/2016   Partial nontraumatic tear of right rotator cuff 04/03/2016   Nail fungus 03/30/2015   Toe pain 03/30/2015   Insomnia 12/19/2014   Pedal edema 11/06/2014   Depression 11/06/2014   Genital herpes 11/06/2014   Back pain 11/06/2014    PCP: Bascom Borer, NP  REFERRING PROVIDER: Bascom Borer, NP  REFERRING DIAG: M25.551 (ICD-10-CM) - Right hip pain   THERAPY DIAG:  Pain in right hip - Plan: PT plan of care cert/re-cert  Other low back pain - Plan: PT plan of care cert/re-cert  Muscle weakness (generalized) - Plan: PT plan of care cert/re-cert  Rationale for Evaluation and Treatment: Rehabilitation  ONSET DATE: 07/17/23  SUBJECTIVE:   SUBJECTIVE STATEMENT: Christy Price  Pt reports she has had some sharp shooting pains in her Rt hip in the past couple of days; states she has been exercising in her mom's pool and has been doing the stretches; pt continues to point to Rt hip region to show location of Rt hip pain  Interpreter: Christy Price  PERTINENT HISTORY: Anxiety, cervical CA, GERD, HTN, meningitis, seizures  PAIN: rating prior to aquatic PT session on 09-10-23  Are you having pain? Yes: NPRS  scale: 6/10 Pain location: low back, R hip Pain description: pinching, tingling, sharp Aggravating factors: sitting, mowing the lawn  Relieving factors: walking  PRECAUTIONS: Fall   PATIENT GOALS: to minimize pain  OBJECTIVE:  Note: Objective measures were completed at Evaluation unless otherwise noted.  DIAGNOSTIC FINDINGS: IMPRESSION: 1. Mild osteoarthritis of the right hip. 2. Mild tendinosis of the right gluteus minimus tendon. Mild peritendinous edema of the right gluteus minimus and medius tendons. 3. Sigmoid diverticulosis.  IMPRESSION: 1. Transitional lumbosacral anatomy as described above. 2. Moderate facet degenerative disease at L5-S1 is worse on the left. The central canal and foramina are open at this level. 3. Minimal disc  bulge and mild facet arthropathy at L4-5 without stenosis.                                                                                                                               TREATMENT:  09-24-23  Aquatic therapy at [facility] - pool temperature 90 degrees   Patient seen for aquatic therapy today.  Treatment took place in water 3.6-4.0 feet deep depending upon activity.  Patient entered and exited the pool via stairs using bil. Hand rails with supervision.    Exercises: -Water walking warmup:  4x18 ft forwards, 18' x 4 reps sideways, and 18'x 2 reps backwards -Stretching:  Runner's stretch for RLE - 20 sec hold x 2 reps;  Rt hamstring stretch with Rt foot on pool wall; 1 rep, 20 sec hold  -Standing marches 10 reps without UE support  Rt hip ROM, strengthening exercises - bouyant ankle cuff placed on RLE - pt performed Rt hip flexion with knee extended 10 reps, hip abduction 10 reps, and hip extension 10 reps;  Rt hip adduction with pt touching pool wall with Rt foot 10 reps; Rt hip abduction/adduction with knee flexed at 90 degrees 10 reps  Squats 10 reps bil. LE's;  Rt unilateral squats 10 reps with UE support on pool wall  Pt stood and made circles CW with RLE 10 reps and then CCW 10 reps with RLE for Rt hip ROM; pt stood on RLE and made CW circles 10 reps with LLE for improved SLS on RLE  Rt piriformis stretch in seated position on bench in pool - held 20 secs x 1 rep Rt piriformis stretch in standing position against pool wall - min assist to flex and adduct RLE for piriformis stretch - 1 rep - 10 sec hold  Patient requires buoyancy of the water for support for reduced fall risk with gait training and balance exercises as well as pain management with no more than minA support for positioning for tasks. Exercises able to be performed safely in water without the risk of fall compared to those same exercises performed on land; viscosity of water needed for resistance for  strengthening. Current of water provides perturbations for challenging static and dynamic balance.   PATIENT EDUCATION:  Education details: Pt instructed to  use frozen water bottle to perform deep pressure massage/STM to Rt ITB Person educated: Patient Education method: Explanation, Demonstration, and Handouts Education comprehension: verbalized understanding  HOME EXERCISE PROGRAM: Access Code: [email or web address] URL: [email or web address] Date: 08/06/2023 Prepared by: Delon Pop  Exercises - Supine Bridge  - 1 x daily - 7 x weekly - 3 sets - 10 reps - Clamshell  - 1 x daily - 7 x weekly - 3 sets - 10 reps - Supine Lower Trunk Rotation  - 1 x daily - 7 x weekly - 3 sets - 10 reps - Supine Posterior Pelvic Tilt  - 1 x daily - 7 x weekly - 3 sets - 10 reps - Supine March  - 1 x daily - 7 x weekly - 3 sets - 10 reps - Seated Slump Nerve Glide  - 1 x daily - 7 x weekly - 3 sets - 10 reps  ASSESSMENT:  CLINICAL IMPRESSION: Aquatic PT session focused on Rt hip and low back stretching and RLE strengthening.  Pt continues to c/o intermittent sharp pains in Rt hip area, with occasional radiation into Rt lateral thigh.  Pt has moderate tenderness of palpation of Rt piriformis region.  Pt rated pain 6/10 at start of today's session but reported minimal pain at end of session after stretching of Rt piriformis and hamstrings and also low back stretching.  Pt has met LTG's #1 and 5:  LTG's #2, 3 and 6 are ongoing; LTG #4 is deferred due to pt requesting to switch land appts to aquatic appts for assist with pain management.  Pt reports significant decrease in pain at end of aquatic PT sessions, but continues to report pain reoccurrence with no permanent pain reduction achieved to date.  Pt requests continuation of aquatic PT to assist with pain management; requesting 4 additional visits to address low back and Rt hip pain.  Cont with POC.    OBJECTIVE IMPAIRMENTS: Abnormal gait, decreased  knowledge of condition, decreased mobility, decreased strength, impaired sensation, improper body mechanics, postural dysfunction, and pain  ACTIVITY LIMITATIONS: carrying, lifting, sitting, stairs, locomotion level, and caring for others  PARTICIPATION LIMITATIONS: meal prep, cleaning, laundry, interpersonal relationship, driving, shopping, community activity, occupation, and yard work  PERSONAL FACTORS: Age, Past/current experiences, and Time since onset of injury/illness/exacerbation are also affecting patient's functional outcome.   REHAB POTENTIAL: Fair time since onset  CLINICAL DECISION MAKING: Stable/uncomplicated  EVALUATION COMPLEXITY: Low   GOALS: Goals reviewed with patient? Yes  SHORT TERM GOALS: Target date: 08/29/23  Pt will be independent with initial HEP for improved functional strength and mobility  Baseline: to be provided Goal status:  IN PROGRESS  2.  Patient will improve her Oswestry score to </=18 to demonstrate a reduction in her symptoms  Baseline: 22 Goal status: IN PROGRESS  3.  Patient will improve her LEFS score to >/= 52 to demonstrate a reduction in her symptoms Baseline: 43 Goal status: IN PROGRESS  4. Pt will improve gait speed to >/= .5m/s to demonstrate improved community ambulation  Baseline: .74m/s Goal status: REVISED  5.  Pt will improve 5x STS to </= 25 sec to demo improved functional LE strength and balance   Baseline: 31.69s Goal status: REVISED   LONG TERM GOALS: Target date: 09/26/23  Pt will be independent with final HEP for improved functional strength and mobility  Baseline: to be provided Goal status: Goal met 09-24-23  Patient will improve her Oswestry score to </=14 to demonstrate a reduction in her symptoms  Baseline: 22 Goal status: In Progress  Patient will improve her LEFS score to >/= 61 to demonstrate a reduction in her symptoms Baseline: 43 Goal status: In Progress  Pt will improve gait speed to >/=  1.51m/s to demonstrate improved community ambulation Baseline: .66m/s Goal status: Deferred due to pt requesting aquatic visits and no land appts at this time  Pt will improve 5x STS to </= 20 sec to demo improved functional LE strength and balance   Baseline: 31.69s;  18 secs Goal status: Goal met 09-24-23    6. Patient will be independent with aquatic HEP for improved mobility     Baseline: to be provided    Goal status: In Progress   UPDATED LTG'S: Target date 10-31-23     1.  Patient will improve her Oswestry score to </=14 to demonstrate a reduction in her symptoms Baseline: 22 Goal status: In Progress  2.  Patient will improve her LEFS score to >/= 61 to demonstrate a reduction in her symptoms Baseline: 43 Goal status: In Progress  3.  Patient will be independent with aquatic HEP for improved mobility    Baseline: to be provided   Goal status: In Progress PLAN:  PT FREQUENCY: 1x/week  PT DURATION: 4 weeks  PLANNED INTERVENTIONS: 97164- PT Re-evaluation, 97750- Physical Performance Testing, 97110-Therapeutic exercises, 97530- Therapeutic activity, [id number]- Neuromuscular re-education, 97535- Self Care, 02859- Manual therapy, [phone]- Gait training, [phone]- Orthotic Initial, [phone]- Orthotic/Prosthetic subsequent, [phone]- Aquatic Therapy, [phone]- Electrical stimulation (manual), Patient/Family education, Balance training, Stair training, Taping, Dry Needling, Joint mobilization, Spinal mobilization, Vestibular training, Visual/preceptual remediation/compensation, DME instructions, Cryotherapy, and Moist heat  PLAN FOR NEXT SESSION: pt requests to switch land appts to aquatic appts.  Aquatics:  Rt hip ROM & stretching; ITB and piriformis stretching   Nadia Viar, [place], PT  09/25/2023, 8:48 PM

## 2023-10-01 ENCOUNTER — Ambulatory Visit: Admitting: Physical Therapy

## 2023-10-06 ENCOUNTER — Ambulatory Visit: Payer: Self-pay | Admitting: Physical Therapy

## 2023-10-06 DIAGNOSIS — M5459 Other low back pain: Secondary | ICD-10-CM | POA: Diagnosis not present

## 2023-10-06 DIAGNOSIS — M25551 Pain in right hip: Secondary | ICD-10-CM | POA: Diagnosis not present

## 2023-10-06 DIAGNOSIS — M6281 Muscle weakness (generalized): Secondary | ICD-10-CM | POA: Diagnosis not present

## 2023-10-06 DIAGNOSIS — R2689 Other abnormalities of gait and mobility: Secondary | ICD-10-CM | POA: Diagnosis not present

## 2023-10-07 ENCOUNTER — Encounter: Payer: Self-pay | Admitting: Physical Therapy

## 2023-10-07 ENCOUNTER — Ambulatory Visit
Admission: RE | Admit: 2023-10-07 | Discharge: 2023-10-07 | Disposition: A | Discharge status: Home / Self Care | Source: Ambulatory Visit | Attending: Nurse Practitioner

## 2023-10-07 DIAGNOSIS — Z1231 Encounter for screening mammogram for malignant neoplasm of breast: Secondary | ICD-10-CM | POA: Diagnosis not present

## 2023-10-07 NOTE — Therapy (Signed)
 OUTPATIENT PHYSICAL THERAPY LOWER EXTREMITY TREATMENT/10th VISIT PROGRESS NOTE   Progress Note  Reporting Period 08-01-23 to 10-06-23  See note below for Objective Data and Assessment of Progress/Goals.  Thank you for the referral of this patient.     Patient Name: Christy Price MRN: [id number] DOB:1970/09/27, 53 y.o., female Today's Date: 10/07/2023  END OF SESSION:  PT End of Session - 10/07/23 1934     Visit Number 10    Number of Visits 13    Date for PT Re-Evaluation 11/07/23    Authorization Type [employer] medicare    Authorization Time Period 5-23 - 09-26-23:  7-28 - 12-07-23    Authorization - Visit Number 1    Authorization - Number of Visits 4   4 additional aquatic visits requested   Progress Note Due on Visit 10    PT Start Time 1320    PT Stop Time 1405    PT Time Calculation (min) 45 min    Equipment Utilized During Treatment Other (comment)   aquatic cuff,  barbells   Activity Tolerance Patient tolerated treatment well    Behavior During Therapy WFL for tasks assessed/performed          Past Medical History:  Diagnosis Date   Allergy    seasonal   Anxiety    Arthritis    shoulder,knees,feet   Back spasm    Cancer (HCC)     hx cervical cx    Depression    GERD (gastroesophageal reflux disease)    Hypertension    Meningitis    Seizures (HCC)    greater than 25 years ago   Past Surgical History:  Procedure Laterality Date   BREAST BIOPSY Left 10/30/2010   BREAST EXCISIONAL BIOPSY Right 1986   carpal tunnel Left    LAPAROSCOPIC HYSTERECTOMY     2000   SHUNT REMOVAL     2007   SHUNT REPLACEMENT     x 4   Patient Active Problem List   Diagnosis Date Noted   Lipid screening 09/20/2022   Left upper quadrant abdominal pain 06/07/2022   Anxiety and depression 02/27/2022   Language barrier 05/09/2019   Chronic pain of right knee 05/09/2019   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   Peripheral edema 11/04/2018   Essential hypertension 11/04/2018    Rash 11/04/2018   Pain in both lower legs 12/12/2016   Blepharitis of upper and lower eyelids of both eyes 09/24/2016   Keratoconjunctivitis sicca of both eyes not specified as Sjogren's 09/24/2016   Presbyopia of both eyes 09/24/2016   Vitreous floater, bilateral 09/24/2016   Partial nontraumatic tear of right rotator cuff 04/03/2016   Nail fungus 03/30/2015   Toe pain 03/30/2015   Insomnia 12/19/2014   Pedal edema 11/06/2014   Depression 11/06/2014   Genital herpes 11/06/2014   Back pain 11/06/2014    PCP: Bascom Borer, NP  REFERRING PROVIDER: Bascom Borer, NP  REFERRING DIAG: M25.551 (ICD-10-CM) - Right hip pain   THERAPY DIAG:  Pain in right hip  Other low back pain  Muscle weakness (generalized)  Rationale for Evaluation and Treatment: Rehabilitation  ONSET DATE: 07/17/23  SUBJECTIVE:   SUBJECTIVE STATEMENT: Christy Price  Pt reports pain is reduced overall; still has some intermittent pain in her low back. Has been doing some exercises in her mother's pool, which is helping  Interpreter: Christy Price  PERTINENT HISTORY: Anxiety, cervical CA, GERD, HTN, meningitis, seizures  PAIN: rating prior to aquatic PT session on 09-10-23  Are you  having pain? Yes: NPRS scale: 5/10 Pain location: low back, R hip Pain description: pinching, tingling, sharp Aggravating factors: sitting, mowing the lawn  Relieving factors: walking  PRECAUTIONS: Fall   PATIENT GOALS: to minimize pain  OBJECTIVE:  Note: Objective measures were completed at Evaluation unless otherwise noted.  DIAGNOSTIC FINDINGS: IMPRESSION: 1. Mild osteoarthritis of the right hip. 2. Mild tendinosis of the right gluteus minimus tendon. Mild peritendinous edema of the right gluteus minimus and medius tendons. 3. Sigmoid diverticulosis.  IMPRESSION: 1. Transitional lumbosacral anatomy as described above. 2. Moderate facet degenerative disease at L5-S1 is worse on the left. The central canal and  foramina are open at this level. 3. Minimal disc bulge and mild facet arthropathy at L4-5 without stenosis.                                                                                                                               TREATMENT:  10-06-23  Aquatic therapy at [facility]  - pool temperature 90 degrees   Patient seen for aquatic therapy today.  Treatment took place in water 3.6-4.0 feet deep depending upon activity.  Patient entered and exited the pool via stairs using bil. Hand rails with supervision.    Exercises: -Water walking warmup:  4x18 ft forwards, 18' x 4 reps sideways, and 18'x 4 reps backwards -Stretching:  Runner's stretch for RLE - 20 sec hold x 2 reps;  Rt hamstring stretch with Rt foot on pool wall; 1 rep, 20 sec hold  -Standing marches 10 reps without UE support  Rt hip ROM, strengthening exercises - bouyant ankle cuff placed on RLE - pt performed Rt hip flexion with knee extended 10 reps, hip abduction 10 reps, and hip extension 10 reps;  Rt hip adduction with pt touching pool wall with Rt foot 10 reps; Rt hip abduction/adduction with knee flexed at 90 degrees 10 reps  Squats 10 reps bil. LE's;  unilateral squats - with RLE and then with LLE-  10 reps each leg with UE support on pool wall  Pt stood and made circles CW with RLE 10 reps and then CCW 10 reps with RLE for Rt hip ROM; pt stood on RLE and made CW circles 10 reps with LLE for improved SLS on RLE  Rt piriformis stretch in seated position on bench in pool - held 20 secs x 1 rep  Patient requires buoyancy of the water for support for reduced fall risk with gait training and balance exercises as well as pain management with no more than minA support for positioning for tasks. Exercises able to be performed safely in water without the risk of fall compared to those same exercises performed on land; viscosity of water needed for resistance for strengthening. Current of water provides  perturbations for challenging static and dynamic balance.   PATIENT EDUCATION:  Education details: Pt instructed to use frozen water bottle to perform deep pressure massage/STM to Rt  ITB Person educated: Patient Education method: Explanation, Demonstration, and Handouts Education comprehension: verbalized understanding  HOME EXERCISE PROGRAM: Access Code: [email or web address] URL: [email or web address] Date: 08/06/2023 Prepared by: Delon Pop  Exercises - Supine Bridge  - 1 x daily - 7 x weekly - 3 sets - 10 reps - Clamshell  - 1 x daily - 7 x weekly - 3 sets - 10 reps - Supine Lower Trunk Rotation  - 1 x daily - 7 x weekly - 3 sets - 10 reps - Supine Posterior Pelvic Tilt  - 1 x daily - 7 x weekly - 3 sets - 10 reps - Supine March  - 1 x daily - 7 x weekly - 3 sets - 10 reps - Seated Slump Nerve Glide  - 1 x daily - 7 x weekly - 3 sets - 10 reps  ASSESSMENT:  CLINICAL IMPRESSION: This 10th visit progress note covers dates 08-01-23 - 10-06-23.  Pt rated pain 5/10 at start of today's session but reported minimal pain at end of session after stretching of Rt piriformis and hamstrings.  Pt has met LTG's #1 and 5:  LTG's #2, 3 and 6 are ongoing; LTG #4 is deferred due to pt requesting to switch land appts to aquatic appts for assist with pain management.  Pt reports significant decrease in pain at end of aquatic PT sessions.  Pt requests continuation of aquatic PT to assist with pain management. Cont with POC.    OBJECTIVE IMPAIRMENTS: Abnormal gait, decreased knowledge of condition, decreased mobility, decreased strength, impaired sensation, improper body mechanics, postural dysfunction, and pain  ACTIVITY LIMITATIONS: carrying, lifting, sitting, stairs, locomotion level, and caring for others  PARTICIPATION LIMITATIONS: meal prep, cleaning, laundry, interpersonal relationship, driving, shopping, community activity, occupation, and yard work  PERSONAL FACTORS: Age, Past/current  experiences, and Time since onset of injury/illness/exacerbation are also affecting patient's functional outcome.   REHAB POTENTIAL: Fair time since onset  CLINICAL DECISION MAKING: Stable/uncomplicated  EVALUATION COMPLEXITY: Low   GOALS: Goals reviewed with patient? Yes  SHORT TERM GOALS: Target date: 08/29/23  Pt will be independent with initial HEP for improved functional strength and mobility  Baseline: to be provided Goal status:  IN PROGRESS  2.  Patient will improve her Oswestry score to </=18 to demonstrate a reduction in her symptoms  Baseline: 22 Goal status: IN PROGRESS  3.  Patient will improve her LEFS score to >/= 52 to demonstrate a reduction in her symptoms Baseline: 43 Goal status: IN PROGRESS  4. Pt will improve gait speed to >/= .6m/s to demonstrate improved community ambulation  Baseline: .17m/s Goal status: REVISED  5.  Pt will improve 5x STS to </= 25 sec to demo improved functional LE strength and balance   Baseline: 31.69s Goal status: REVISED   LONG TERM GOALS: Target date: 09/26/23  Pt will be independent with final HEP for improved functional strength and mobility  Baseline: to be provided Goal status: Goal met 09-24-23  Patient will improve her Oswestry score to </=14 to demonstrate a reduction in her symptoms  Baseline: 22 Goal status: In Progress  Patient will improve her LEFS score to >/= 61 to demonstrate a reduction in her symptoms Baseline: 43 Goal status: In Progress  Pt will improve gait speed to >/= 1.74m/s to demonstrate improved community ambulation Baseline: .22m/s Goal status: Deferred due to pt requesting aquatic visits and no land appts at this time  Pt will improve 5x STS to </= 20 sec to demo improved functional LE  strength and balance   Baseline: 31.69s;  18 secs Goal status: Goal met 09-24-23    6. Patient will be independent with aquatic HEP for improved mobility     Baseline: to be provided    Goal status: In  Progress   UPDATED LTG'S: Target date 10-31-23     1.  Patient will improve her Oswestry score to </=14 to demonstrate a reduction in her symptoms Baseline: 22 Goal status: In Progress  2.  Patient will improve her LEFS score to >/= 61 to demonstrate a reduction in her symptoms Baseline: 43 Goal status: In Progress  3.  Patient will be independent with aquatic HEP for improved mobility    Baseline: to be provided   Goal status: In Progress PLAN:  PT FREQUENCY: 1x/week  PT DURATION: 4 weeks  PLANNED INTERVENTIONS: 97164- PT Re-evaluation, 97750- Physical Performance Testing, 97110-Therapeutic exercises, 97530- Therapeutic activity, [id number]- Neuromuscular re-education, 97535- Self Care, 02859- Manual therapy, [phone]- Gait training, [phone]- Orthotic Initial, [phone]- Orthotic/Prosthetic subsequent, [phone]- Aquatic Therapy, [phone]- Electrical stimulation (manual), Patient/Family education, Balance training, Stair training, Taping, Dry Needling, Joint mobilization, Spinal mobilization, Vestibular training, Visual/preceptual remediation/compensation, DME instructions, Cryotherapy, and Moist heat  PLAN FOR NEXT SESSION: pt requests to switch land appts to aquatic appts.  Aquatics:  Rt hip ROM & stretching; ITB and piriformis stretching   Ryker [place], [place], PT  10/07/2023, 7:46 PM

## 2023-10-13 ENCOUNTER — Ambulatory Visit: Attending: Nurse Practitioner | Admitting: Physical Therapy

## 2023-10-13 DIAGNOSIS — M25551 Pain in right hip: Secondary | ICD-10-CM | POA: Diagnosis not present

## 2023-10-13 DIAGNOSIS — M5459 Other low back pain: Secondary | ICD-10-CM | POA: Insufficient documentation

## 2023-10-13 DIAGNOSIS — R2689 Other abnormalities of gait and mobility: Secondary | ICD-10-CM | POA: Diagnosis not present

## 2023-10-13 DIAGNOSIS — M6281 Muscle weakness (generalized): Secondary | ICD-10-CM | POA: Insufficient documentation

## 2023-10-15 ENCOUNTER — Encounter: Payer: Self-pay | Admitting: Physical Therapy

## 2023-10-15 NOTE — Therapy (Signed)
 OUTPATIENT PHYSICAL THERAPY LOWER EXTREMITY TREATMENT/AQUATIC THERAPY TREATMENT NOTE      Patient Name: Christy Price MRN: [id number] DOB:[birth year], 53 y.o., female Today's Date: 10/15/2023  END OF SESSION:  PT End of Session - 10/15/23 1013     Visit Number 11    Number of Visits 13    Date for PT Re-Evaluation 11/07/23    Authorization Type [employer]    Authorization Time Period 5-23 - 09-26-23:  7-28 - 12-07-23    Authorization - Visit Number 2    Authorization - Number of Visits 4   4 additional aquatic visits requested   Progress Note Due on Visit 10    PT Start Time 1400    PT Stop Time 1445    PT Time Calculation (min) 45 min    Equipment Utilized During Treatment Other (comment)   aquatic cuff,  barbells   Activity Tolerance Patient tolerated treatment well    Behavior During Therapy WFL for tasks assessed/performed          Past Medical History:  Diagnosis Date   Allergy    seasonal   Anxiety    Arthritis    shoulder,knees,feet   Back spasm    Cancer (HCC)     hx cervical cx    Depression    GERD (gastroesophageal reflux disease)    Hypertension    Meningitis    Seizures (HCC)    greater than 25 years ago   Past Surgical History:  Procedure Laterality Date   BREAST BIOPSY Left 10/30/2010   BREAST EXCISIONAL BIOPSY Right 1986   carpal tunnel Left    LAPAROSCOPIC HYSTERECTOMY     2000   SHUNT REMOVAL     2007   SHUNT REPLACEMENT     x 4   Patient Active Problem List   Diagnosis Date Noted   Lipid screening 09/20/2022   Left upper quadrant abdominal pain 06/07/2022   Anxiety and depression 02/27/2022   Language barrier 05/09/2019   Chronic pain of right knee 05/09/2019   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   Peripheral edema 11/04/2018   Essential hypertension 11/04/2018   Rash 11/04/2018   Pain in both lower legs 12/12/2016   Blepharitis of upper and lower eyelids of both eyes 09/24/2016   Keratoconjunctivitis sicca of both eyes not  specified as Sjogren's 09/24/2016   Presbyopia of both eyes 09/24/2016   Vitreous floater, bilateral 09/24/2016   Partial nontraumatic tear of right rotator cuff 04/03/2016   Nail fungus 03/30/2015   Toe pain 03/30/2015   Insomnia 12/19/2014   Pedal edema 11/06/2014   Depression 11/06/2014   Genital herpes 11/06/2014   Back pain 11/06/2014    PCP: Bascom Borer, NP  REFERRING PROVIDER: Bascom Borer, NP  REFERRING DIAG: M25.551 (ICD-10-CM) - Right hip pain   THERAPY DIAG:  Pain in right hip  Muscle weakness (generalized)  Other abnormalities of gait and mobility  Rationale for Evaluation and Treatment: Rehabilitation  ONSET DATE: 07/17/23  SUBJECTIVE:   SUBJECTIVE STATEMENT: [name]  Pt reports pain is getting better overall; still has occasional sharp pains in Rt hip but doesn't last long.  Continues to exercise in her mother's pool  Interpreter: Dagoberto Ned  PERTINENT HISTORY: Anxiety, cervical CA, GERD, HTN, meningitis, seizures  PAIN: rating prior to aquatic PT session on 09-10-23  Are you having pain? Yes: NPRS scale: 5/10 Pain location: low back, R hip Pain description: pinching, tingling, sharp Aggravating factors: sitting, mowing the lawn  Relieving factors: walking  PRECAUTIONS: Fall   PATIENT GOALS: to minimize pain  OBJECTIVE:  Note: Objective measures were completed at Evaluation unless otherwise noted.  DIAGNOSTIC FINDINGS: IMPRESSION: 1. Mild osteoarthritis of the right hip. 2. Mild tendinosis of the right gluteus minimus tendon. Mild peritendinous edema of the right gluteus minimus and medius tendons. 3. Sigmoid diverticulosis.  IMPRESSION: 1. Transitional lumbosacral anatomy as described above. 2. Moderate facet degenerative disease at L5-S1 is worse on the left. The central canal and foramina are open at this level. 3. Minimal disc bulge and mild facet arthropathy at L4-5 without stenosis.                                                                                                                                TREATMENT:  10-13-23  Aquatic therapy at [facility]  - pool temperature 90 degrees   Patient seen for aquatic therapy today.  Treatment took place in water 3.6-4.0 feet deep depending upon activity.  Patient entered and exited the pool via stairs using bil. Hand rails with supervision.    Exercises: -Water walking warmup:  4x18 ft forwards, 18' x 4 reps sideways, and 18'x 2 reps backwards -Stretching:  Runner's stretch for RLE - 20 sec hold x 1 rep:  Rt hamstring stretch with Rt foot on pool wall; 1 rep, 20 sec hold  -Standing marches 10 reps without UE support  Rt hip ROM, strengthening exercises - bouyant ankle cuff placed on RLE - pt performed Rt hip flexion with knee extended 10 reps, hip abduction 10 reps, and hip extension 10 reps;  Rt hip adduction with pt touching pool wall with Rt foot 10 reps; Rt hip abduction/adduction with knee flexed at 90 degrees 10 reps  Squats 10 reps bil. LE's;  unilateral squats - with RLE and then with LLE-  10 reps each leg with UE support on pool wall  Pt stood and made circles CW with RLE 10 reps and then CCW 10 reps with RLE for Rt hip ROM; pt stood on RLE and made CW circles 10 reps with LLE for improved SLS on RLE  Rt piriformis stretch in seated position on bench in pool - held 20 secs x 1 rep  Standing trunk rotation with yellow noodle - 10 reps to each side   Patient requires buoyancy of the water for support for reduced fall risk with gait training and balance exercises as well as pain management with no more than minA support for positioning for tasks. Exercises able to be performed safely in water without the risk of fall compared to those same exercises performed on land; viscosity of water needed for resistance for strengthening. Current of water provides perturbations for challenging static and dynamic balance.   PATIENT EDUCATION:  Education  details: Pt instructed to use frozen water bottle to perform deep pressure massage/STM to Rt ITB Person educated: Patient Education method: Explanation, Demonstration, and Handouts Education comprehension: verbalized understanding  HOME EXERCISE PROGRAM: Access Code: [email or web address] URL: [email or web address] Date: 08/06/2023 Prepared by: Delon Pop  Exercises - Supine Bridge  - 1 x daily - 7 x weekly - 3 sets - 10 reps - Clamshell  - 1 x daily - 7 x weekly - 3 sets - 10 reps - Supine Lower Trunk Rotation  - 1 x daily - 7 x weekly - 3 sets - 10 reps - Supine Posterior Pelvic Tilt  - 1 x daily - 7 x weekly - 3 sets - 10 reps - Supine March  - 1 x daily - 7 x weekly - 3 sets - 10 reps - Seated Slump Nerve Glide  - 1 x daily - 7 x weekly - 3 sets - 10 reps  ASSESSMENT:  CLINICAL IMPRESSION: Aquatic PT session focused on Rt hip and low back stretching with focus on Rt piriformis and low back musc. Stretching.  Session also focused on core stabilization exercises.  Pt reports reduced pain in low back and Rt hip overall.  Pt had slight LOB with stepping backward from forward position but improved with instruction to take smaller step and keep core tight for increased core stability.  Pt tolerated exercises well - plan D/C next session.  Cont with POC.    OBJECTIVE IMPAIRMENTS: Abnormal gait, decreased knowledge of condition, decreased mobility, decreased strength, impaired sensation, improper body mechanics, postural dysfunction, and pain  ACTIVITY LIMITATIONS: carrying, lifting, sitting, stairs, locomotion level, and caring for others  PARTICIPATION LIMITATIONS: meal prep, cleaning, laundry, interpersonal relationship, driving, shopping, community activity, occupation, and yard work  PERSONAL FACTORS: Age, Past/current experiences, and Time since onset of injury/illness/exacerbation are also affecting patient's functional outcome.   REHAB POTENTIAL: Fair time since  onset  CLINICAL DECISION MAKING: Stable/uncomplicated  EVALUATION COMPLEXITY: Low   GOALS: Goals reviewed with patient? Yes  SHORT TERM GOALS: Target date: 08/29/23  Pt will be independent with initial HEP for improved functional strength and mobility  Baseline: to be provided Goal status:  IN PROGRESS  2.  Patient will improve her Oswestry score to </=18 to demonstrate a reduction in her symptoms  Baseline: 22 Goal status: IN PROGRESS  3.  Patient will improve her LEFS score to >/= 52 to demonstrate a reduction in her symptoms Baseline: 43 Goal status: IN PROGRESS  4. Pt will improve gait speed to >/= .59m/s to demonstrate improved community ambulation  Baseline: .26m/s Goal status: REVISED  5.  Pt will improve 5x STS to </= 25 sec to demo improved functional LE strength and balance   Baseline: 31.69s Goal status: REVISED   LONG TERM GOALS: Target date: 09/26/23  Pt will be independent with final HEP for improved functional strength and mobility  Baseline: to be provided Goal status: Goal met 09-24-23  Patient will improve her Oswestry score to </=14 to demonstrate a reduction in her symptoms  Baseline: 22 Goal status: In Progress  Patient will improve her LEFS score to >/= 61 to demonstrate a reduction in her symptoms Baseline: 43 Goal status: In Progress  Pt will improve gait speed to >/= 1.66m/s to demonstrate improved community ambulation Baseline: .19m/s Goal status: Deferred due to pt requesting aquatic visits and no land appts at this time  Pt will improve 5x STS to </= 20 sec to demo improved functional LE strength and balance   Baseline: 31.69s;  18 secs Goal status: Goal met 09-24-23    6. Patient will be independent with aquatic HEP for improved mobility  Baseline: to be provided    Goal status: In Progress   UPDATED LTG'S: Target date 10-31-23     1.  Patient will improve her Oswestry score to </=14 to demonstrate a reduction in her  symptoms Baseline: 22 Goal status: In Progress  2.  Patient will improve her LEFS score to >/= 61 to demonstrate a reduction in her symptoms Baseline: 43 Goal status: In Progress  3.  Patient will be independent with aquatic HEP for improved mobility    Baseline: to be provided   Goal status: In Progress PLAN:  PT FREQUENCY: 1x/week  PT DURATION: 4 weeks  PLANNED INTERVENTIONS: 97164- PT Re-evaluation, 97750- Physical Performance Testing, 97110-Therapeutic exercises, 97530- Therapeutic activity, [id number]- Neuromuscular re-education, 97535- Self Care, 02859- Manual therapy, [phone]- Gait training, [phone]- Orthotic Initial, [phone]- Orthotic/Prosthetic subsequent, [phone]- Aquatic Therapy, [phone]- Electrical stimulation (manual), Patient/Family education, Balance training, Stair training, Taping, Dry Needling, Joint mobilization, Spinal mobilization, Vestibular training, Visual/preceptual remediation/compensation, DME instructions, Cryotherapy, and Moist heat  PLAN FOR NEXT SESSION: pt requests to switch land appts to aquatic appts.  Aquatics:  Rt hip ROM & stretching; ITB and piriformis stretching   Akosua Constantine, [place], PT  10/15/2023, 10:15 AM

## 2023-10-20 ENCOUNTER — Ambulatory Visit: Payer: Self-pay | Admitting: Physical Therapy

## 2023-10-20 DIAGNOSIS — M5459 Other low back pain: Secondary | ICD-10-CM | POA: Diagnosis not present

## 2023-10-20 DIAGNOSIS — M6281 Muscle weakness (generalized): Secondary | ICD-10-CM | POA: Diagnosis not present

## 2023-10-20 DIAGNOSIS — M25551 Pain in right hip: Secondary | ICD-10-CM | POA: Diagnosis not present

## 2023-10-20 DIAGNOSIS — R2689 Other abnormalities of gait and mobility: Secondary | ICD-10-CM | POA: Diagnosis not present

## 2023-10-21 ENCOUNTER — Encounter: Payer: Self-pay | Admitting: Physical Therapy

## 2023-10-21 NOTE — Therapy (Signed)
 OUTPATIENT PHYSICAL THERAPY LOWER EXTREMITY TREATMENT/AQUATIC THERAPY TREATMENT NOTE/DISCHARGE SUMMARY      Patient Name: Christy Price MRN: [id number] DOB:1970-12-03, 53 y.o., female Today's Date: 10/21/2023  END OF SESSION:  PT End of Session - 10/21/23 2039     Visit Number 12    Number of Visits 13    Date for PT Re-Evaluation 11/07/23    Authorization Type [employer] medicare    Authorization Time Period 5-23 - 09-26-23:  7-28 - 12-07-23    Authorization - Visit Number 3    Authorization - Number of Visits 4   4 additional aquatic visits requested   Progress Note Due on Visit 10    PT Start Time 1315    PT Stop Time 1400    PT Time Calculation (min) 45 min    Equipment Utilized During Treatment Other (comment)   aquatic cuff,  barbells   Activity Tolerance Patient tolerated treatment well    Behavior During Therapy WFL for tasks assessed/performed          Past Medical History:  Diagnosis Date   Allergy    seasonal   Anxiety    Arthritis    shoulder,knees,feet   Back spasm    Cancer (HCC)     hx cervical cx    Depression    GERD (gastroesophageal reflux disease)    Hypertension    Meningitis    Seizures (HCC)    greater than 25 years ago   Past Surgical History:  Procedure Laterality Date   BREAST BIOPSY Left 10/30/2010   BREAST EXCISIONAL BIOPSY Right 1986   carpal tunnel Left    LAPAROSCOPIC HYSTERECTOMY     2000   SHUNT REMOVAL     2007   SHUNT REPLACEMENT     x 4   Patient Active Problem List   Diagnosis Date Noted   Lipid screening 09/20/2022   Left upper quadrant abdominal pain 06/07/2022   Anxiety and depression 02/27/2022   Language barrier 05/09/2019   Chronic pain of right knee 05/09/2019   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   Peripheral edema 11/04/2018   Essential hypertension 11/04/2018   Rash 11/04/2018   Pain in both lower legs 12/12/2016   Blepharitis of upper and lower eyelids of both eyes 09/24/2016   Keratoconjunctivitis sicca  of both eyes not specified as Sjogren's 09/24/2016   Presbyopia of both eyes 09/24/2016   Vitreous floater, bilateral 09/24/2016   Partial nontraumatic tear of right rotator cuff 04/03/2016   Nail fungus 03/30/2015   Toe pain 03/30/2015   Insomnia 12/19/2014   Pedal edema 11/06/2014   Depression 11/06/2014   Genital herpes 11/06/2014   Back pain 11/06/2014    PCP: Bascom Borer, NP  REFERRING PROVIDER: Bascom Borer, NP  REFERRING DIAG: M25.551 (ICD-10-CM) - Right hip pain   THERAPY DIAG:  Pain in right hip  Other low back pain  Muscle weakness (generalized)  Rationale for Evaluation and Treatment: Rehabilitation  ONSET DATE: 07/17/23  SUBJECTIVE:   SUBJECTIVE STATEMENT: Christy Price  Pt reports no pain in Rt hip or low back at start of session; is doing much better. Agrees with discharge today. Continues to exercise in mother's pool weather permitting   Interpreter: Christy Price  PERTINENT HISTORY: Anxiety, cervical CA, GERD, HTN, meningitis, seizures  PAIN:   Are you having pain? No   PRECAUTIONS: Fall   PATIENT GOALS: to minimize pain  OBJECTIVE:  Note: Objective measures were completed at Evaluation unless otherwise noted.  DIAGNOSTIC FINDINGS: IMPRESSION:  1. Mild osteoarthritis of the right hip. 2. Mild tendinosis of the right gluteus minimus tendon. Mild peritendinous edema of the right gluteus minimus and medius tendons. 3. Sigmoid diverticulosis.  IMPRESSION: 1. Transitional lumbosacral anatomy as described above. 2. Moderate facet degenerative disease at L5-S1 is worse on the left. The central canal and foramina are open at this level. 3. Minimal disc bulge and mild facet arthropathy at L4-5 without stenosis.                                                                                                                               TREATMENT:  10-20-23  Aquatic therapy at [facility]  - pool temperature 90 degrees   Patient  seen for aquatic therapy today.  Treatment took place in water 3.6-4.0 feet deep depending upon activity.  Patient entered and exited the pool via stairs using bil. Hand rails with supervision.    Exercises: -Water walking warmup:  4x18 ft forwards, 18' x 4 reps sideways, and 18'x 4 reps backwards -Stretching:  Runner's stretch for RLE - 20 sec hold x 1 rep:  Rt hamstring stretch with Rt foot on pool wall; 1 rep, 20 sec hold  -Standing marches 10 reps without UE support  Rt hip ROM, strengthening exercises - bouyant ankle cuff placed on RLE - pt performed Rt hip flexion with knee extended 10 reps, hip abduction 10 reps, and hip extension 10 reps;  pt performed circles CW 10 reps each leg;  10 reps CCW direction each leg   Squats 10 reps bil. LE's;  unilateral squats - with RLE and then with LLE-  10 reps each leg with UE support on pool wall  Rt piriformis stretch in seated position on bench in pool - held 20 secs x 1 rep  Standing trunk rotation with yellow noodle - 10 reps to each side   Patient requires buoyancy of the water for support for reduced fall risk with gait training and balance exercises as well as pain management with no more than minA support for positioning for tasks. Exercises able to be performed safely in water without the risk of fall compared to those same exercises performed on land; viscosity of water needed for resistance for strengthening. Current of water provides perturbations for challenging static and dynamic balance.   PATIENT EDUCATION:  Education details: Pt instructed to use frozen water bottle to perform deep pressure massage/STM to Rt ITB Person educated: Patient Education method: Explanation, Demonstration, and Handouts Education comprehension: verbalized understanding  HOME EXERCISE PROGRAM: Access Code: [email or web address] URL: [email or web address] Date: 08/06/2023 Prepared by: Delon Pop  Exercises - Supine Bridge  - 1 x daily - 7 x  weekly - 3 sets - 10 reps - Clamshell  - 1 x daily - 7 x weekly - 3 sets - 10 reps - Supine Lower Trunk Rotation  - 1 x daily - 7 x weekly - 3 sets - 10 reps -  Supine Posterior Pelvic Tilt  - 1 x daily - 7 x weekly - 3 sets - 10 reps - Supine March  - 1 x daily - 7 x weekly - 3 sets - 10 reps - Seated Slump Nerve Glide  - 1 x daily - 7 x weekly - 3 sets - 10 reps  ASSESSMENT:  CLINICAL IMPRESSION: Aquatic PT session focused on water walking, Rt hip strengthening and low back stretching.  Pt reported no pain in Rt hip or low back at start of today's session - first time for no c/o pain.  Pt has met LTG #3 with LTG's #1 & 2 deferred as pt requested to switch land appts to aquatic appts due to pain relief achieved and benefits of aquatic PT compared to the results she was obtaining with land based therapy.  Pt is independent in aquatic HEP.  Pt agrees with D/C plan at this time.  OBJECTIVE IMPAIRMENTS: Abnormal gait, decreased knowledge of condition, decreased mobility, decreased strength, impaired sensation, improper body mechanics, postural dysfunction, and pain  ACTIVITY LIMITATIONS: carrying, lifting, sitting, stairs, locomotion level, and caring for others  PARTICIPATION LIMITATIONS: meal prep, cleaning, laundry, interpersonal relationship, driving, shopping, community activity, occupation, and yard work  PERSONAL FACTORS: Age, Past/current experiences, and Time since onset of injury/illness/exacerbation are also affecting patient's functional outcome.   REHAB POTENTIAL: Fair time since onset  CLINICAL DECISION MAKING: Stable/uncomplicated  EVALUATION COMPLEXITY: Low   GOALS: Goals reviewed with patient? Yes  SHORT TERM GOALS: Target date: 08/29/23  Pt will be independent with initial HEP for improved functional strength and mobility  Baseline: to be provided Goal status:  IN PROGRESS  2.  Patient will improve her Oswestry score to </=18 to demonstrate a reduction in her  symptoms  Baseline: 22 Goal status: IN PROGRESS  3.  Patient will improve her LEFS score to >/= 52 to demonstrate a reduction in her symptoms Baseline: 43 Goal status: IN PROGRESS  4. Pt will improve gait speed to >/= .40m/s to demonstrate improved community ambulation  Baseline: .23m/s Goal status: REVISED  5.  Pt will improve 5x STS to </= 25 sec to demo improved functional LE strength and balance   Baseline: 31.69s Goal status: REVISED   LONG TERM GOALS: Target date: 09/26/23  Pt will be independent with final HEP for improved functional strength and mobility  Baseline: to be provided Goal status: Goal met 09-24-23  Patient will improve her Oswestry score to </=14 to demonstrate a reduction in her symptoms  Baseline: 22 Goal status: In Progress  Patient will improve her LEFS score to >/= 61 to demonstrate a reduction in her symptoms Baseline: 43 Goal status: In Progress  Pt will improve gait speed to >/= 1.22m/s to demonstrate improved community ambulation Baseline: .18m/s Goal status: Deferred due to pt requesting aquatic visits and no land appts at this time  Pt will improve 5x STS to </= 20 sec to demo improved functional LE strength and balance   Baseline: 31.69s;  18 secs Goal status: Goal met 09-24-23    6. Patient will be independent with aquatic HEP for improved mobility     Baseline: to be provided    Goal status: In Progress   UPDATED LTG'S: Target date 10-31-23     1.  Patient will improve her Oswestry score to </=14 to demonstrate a reduction in her symptoms Baseline: 22 Goal status:  Deferred  2.  Patient will improve her LEFS score to >/= 61 to demonstrate  a reduction in her symptoms Baseline: 43 Goal status: Deferred  3.  Patient will be independent with aquatic HEP for improved mobility    Baseline: to be provided   Goal status: Goal met 10-20-23 PLAN:  PT FREQUENCY: 1x/week  PT DURATION: 4 weeks  PLANNED INTERVENTIONS: 97164- PT  Re-evaluation, 97750- Physical Performance Testing, 97110-Therapeutic exercises, 97530- Therapeutic activity, 97112- Neuromuscular re-education, 97535- Self Care, 02859- Manual therapy, [phone]- Gait training, [phone]- Orthotic Initial, [phone]- Orthotic/Prosthetic subsequent, [phone]- Aquatic Therapy, [phone]- Electrical stimulation (manual), Patient/Family education, Balance training, Stair training, Taping, Dry Needling, Joint mobilization, Spinal mobilization, Vestibular training, Visual/preceptual remediation/compensation, DME instructions, Cryotherapy, and Moist heat  PLAN FOR NEXT SESSION: N/A - D/C on 10-20-23    PHYSICAL THERAPY DISCHARGE SUMMARY  Visits from Start of Care: 12  Current functional level related to goals / functional outcomes: See above for progress towards goals   Remaining deficits: Intermittent c/o Rt hip and low back pain - pt reported no pain at start of today's session for 1st time since PT initial eval on 08-01-23   Education / Equipment: Pt is independent in aquatic PT HEP - reports she is using pool at her mother's house.   Patient agrees to discharge. Patient goals were met. Patient is being discharged due to meeting the stated rehab goals. No further needs identified at this time which warrant skilled PT intervention.   Roxanna [place], PT  10/21/2023, 8:41 PM

## 2023-11-25 ENCOUNTER — Telehealth: Payer: Self-pay

## 2023-11-25 NOTE — Telephone Encounter (Signed)
 Copied from CRM [id number]. Topic: Clinical - Medical Advice >> Nov 25, 2023 11:37 AM Winona R wrote: Pt returning the call ( with sign language interpreter)  in regards to a request for breast exam however she has already had it done on July 29t,2025. Results should be in her chart if not it was done at [facility] on [address]. Pt is also interested in receiving her results from that exam please contact her when you have obtained the records to set up a follow up appointment

## 2024-01-19 ENCOUNTER — Ambulatory Visit: Payer: Self-pay | Admitting: Nurse Practitioner

## 2024-01-19 ENCOUNTER — Encounter: Payer: Self-pay | Admitting: Nurse Practitioner

## 2024-01-19 VITALS — BP 147/67 | HR 67 | Wt 174.0 lb

## 2024-01-19 DIAGNOSIS — Z23 Encounter for immunization: Secondary | ICD-10-CM | POA: Diagnosis not present

## 2024-01-19 DIAGNOSIS — I10 Essential (primary) hypertension: Secondary | ICD-10-CM | POA: Diagnosis not present

## 2024-01-19 DIAGNOSIS — M25472 Effusion, left ankle: Secondary | ICD-10-CM | POA: Diagnosis not present

## 2024-01-19 DIAGNOSIS — F419 Anxiety disorder, unspecified: Secondary | ICD-10-CM | POA: Diagnosis not present

## 2024-01-19 DIAGNOSIS — F32A Depression, unspecified: Secondary | ICD-10-CM

## 2024-01-19 NOTE — Progress Notes (Signed)
 Subjective   Patient ID: Christy Price, female    DOB: March 19, 1970, 53 y.o.   MRN: [id number]  Chief Complaint  Patient presents with   Hypertension   Medication Management    Stopped taking furosemide  due to causing dry eyes would like to try a new medication, stopped taking Lexapro  says its ineffective    hormonal imbalance     Had a hard time regulating body temp     Referring provider: Oley Bascom RAMAN, NP  Christy Cheyne Bungert is a 53 y.o. female with Past Medical History: No date: Allergy     Comment:  seasonal No date: Anxiety No date: Arthritis     Comment:  shoulder,knees,feet No date: Back spasm No date: Cancer (HCC)     Comment:   hx cervical cx  No date: Depression No date: GERD (gastroesophageal reflux disease) No date: Hypertension No date: Meningitis No date: Seizures (HCC)     Comment:  greater than 25 years ago   Hypertension    Patient presents today for follow-up visit.  Overall doing well.  She states that she did stop Lasix  due to dry eyes.  She was started on Lasix  for swelling to her left knee.  She is also on HCTZ.  We will DC Lasix  and continue HCTZ for now.  Will recheck labs today. Denies f/c/s, n/v/d, hemoptysis, PND, leg swelling Denies chest pain or edema      Allergies  Allergen Reactions   Epinephrine Other (See Comments)   Oxycodone    Rifampin     Immunization History  Administered Date(s) Administered   Influenza, Seasonal, Injecte, Preservative Fre 03/11/2023   Influenza,inj,Quad PF,6+ Mos 11/04/2018, 12/28/2019   PFIZER(Purple Top)SARS-COV-2 Vaccination 06/10/2019, 07/06/2019, 04/07/2020   Tdap 04/09/2017    Tobacco History: Social History   Tobacco Use  Smoking Status Every Day   Current packs/day: 1.50   Types: Cigarettes   Passive exposure: Never  Smokeless Tobacco Never  Tobacco Comments   1 pack a week.   Ready to quit: Not Answered Counseling given: Not Answered Tobacco comments: 1 pack a  week.   Outpatient Encounter Medications as of 01/19/2024  Medication Sig   Cholecalciferol (D3 PO) Take by mouth.   cyclobenzaprine  (FLEXERIL ) 10 MG tablet Take 1 tablet (10 mg total) by mouth 3 (three) times daily as needed for muscle spasms.   hydrochlorothiazide  (HYDRODIURIL ) 25 MG tablet TAKE 1 TABLET EVERY DAY   pantoprazole  (PROTONIX ) 40 MG tablet TAKE 1 TABLET EVERY DAY   terbinafine  (LAMISIL ) 250 MG tablet TAKE 1 TABLET BY MOUTH EVERY DAY   valsartan  (DIOVAN ) 80 MG tablet Take 1 tablet (80 mg total) by mouth daily.   [DISCONTINUED] escitalopram  (LEXAPRO ) 10 MG tablet TAKE 1 TABLET EVERY DAY   benzonatate  (TESSALON ) 200 MG capsule Take 1 capsule (200 mg total) by mouth 2 (two) times daily as needed for cough. (Patient not taking: Reported on 01/19/2024)   furosemide  (LASIX ) 20 MG tablet TAKE 1 TABLET EVERY DAY (Patient not taking: Reported on 01/19/2024)   gabapentin  (NEURONTIN ) 300 MG capsule Take 1 capsule (300 mg total) by mouth 2 (two) times daily for 10 days. (Patient not taking: Reported on 01/19/2024)   hydrOXYzine  (ATARAX /VISTARIL ) 10 MG tablet Take 1 tablet (10 mg total) by mouth 3 (three) times daily as needed. (Patient not taking: Reported on 01/19/2024)   nicotine  polacrilex (NICOTINE  MINI) 2 MG lozenge Take 1 lozenge (2 mg total) by mouth as needed for smoking cessation. (Patient not taking:  Reported on 01/19/2024)   zolpidem (AMBIEN) 10 MG tablet Take 10 mg by mouth at bedtime as needed for sleep. (Patient not taking: Reported on 04/17/2023)   [DISCONTINUED] buPROPion  ER (WELLBUTRIN  SR) 100 MG 12 hr tablet TAKE 1 TABLET BY MOUTH TWICE A DAY (Patient not taking: Reported on 01/19/2024)   No facility-administered encounter medications on file as of 01/19/2024.    Review of Systems  Review of Systems  Constitutional: Negative.   HENT: Negative.    Cardiovascular: Negative.   Gastrointestinal: Negative.   Allergic/Immunologic: Negative.   Neurological: Negative.    Psychiatric/Behavioral: Negative.       Objective:   BP (!) 147/67 (BP Location: Left Arm, Patient Position: Sitting, Cuff Size: Large)   Pulse 67   Wt 174 lb (78.9 kg)   SpO2 100%   BMI 35.14 kg/m   Wt Readings from Last 5 Encounters:  01/19/24 174 lb (78.9 kg)  07/17/23 163 lb 6.4 oz (74.1 kg)  04/17/23 158 lb (71.7 kg)  11/20/22 155 lb (70.3 kg)  09/20/22 156 lb 12.8 oz (71.1 kg)     Physical Exam Vitals and nursing note reviewed.  Constitutional:      General: She is not in acute distress.    Appearance: She is well-developed.  Cardiovascular:     Rate and Rhythm: Normal rate and regular rhythm.  Pulmonary:     Effort: Pulmonary effort is normal.     Breath sounds: Normal breath sounds.  Neurological:     Mental Status: She is alert and oriented to person, place, and time.       Assessment & Plan:   Left ankle swelling -     Ambulatory referral to Podiatry  Anxiety and depression -     Ambulatory referral to Psychiatry  Essential hypertension -     Comprehensive metabolic panel with GFR     Return in about 3 months (around 04/20/2024).   Bascom GORMAN Borer, NP 01/19/2024

## 2024-01-20 LAB — COMPREHENSIVE METABOLIC PANEL WITH GFR
ALT: 8 IU/L (ref 0–32)
AST: 16 IU/L (ref 0–40)
Albumin: 4.7 g/dL (ref 3.8–4.9)
Alkaline Phosphatase: 80 IU/L (ref 49–135)
BUN/Creatinine Ratio: 25 — ABNORMAL HIGH (ref 9–23)
BUN: 15 mg/dL (ref 6–24)
Bilirubin Total: <0.2 mg/dL (ref 0.0–1.2)
CO2: 26 mmol/L (ref 20–29)
Calcium: 9.7 mg/dL (ref 8.7–10.2)
Chloride: 97 mmol/L (ref 96–106)
Creatinine, Ser: 0.6 mg/dL (ref 0.57–1.00)
Globulin, Total: 2 g/dL (ref 1.5–4.5)
Glucose: 60 mg/dL — ABNORMAL LOW (ref 70–99)
Potassium: 4.4 mmol/L (ref 3.5–5.2)
Sodium: 136 mmol/L (ref 134–144)
Total Protein: 6.7 g/dL (ref 6.0–8.5)
eGFR: 107 mL/min/1.73 (ref >59)

## 2024-01-21 ENCOUNTER — Ambulatory Visit: Payer: Self-pay | Admitting: Nurse Practitioner

## 2024-01-21 DIAGNOSIS — F419 Anxiety disorder, unspecified: Secondary | ICD-10-CM | POA: Diagnosis not present

## 2024-01-21 DIAGNOSIS — I10 Essential (primary) hypertension: Secondary | ICD-10-CM | POA: Diagnosis not present

## 2024-01-21 DIAGNOSIS — Z23 Encounter for immunization: Secondary | ICD-10-CM

## 2024-01-21 DIAGNOSIS — F32A Depression, unspecified: Secondary | ICD-10-CM | POA: Diagnosis not present

## 2024-01-21 DIAGNOSIS — M25472 Effusion, left ankle: Secondary | ICD-10-CM | POA: Diagnosis not present

## 2024-01-21 NOTE — Addendum Note (Signed)
 Addended by: VICTORY IHA on: 01/21/2024 01:01 PM   Modules accepted: Orders

## 2024-01-28 ENCOUNTER — Ambulatory Visit: Admitting: Podiatry

## 2024-02-02 ENCOUNTER — Telehealth: Payer: Self-pay | Admitting: Podiatry

## 2024-02-02 NOTE — Telephone Encounter (Signed)
 Interpreter Services states there isn't an interpreter available to accompany the patient to tomorrows appointment. Is it okay with you if  interpreter service provider uses an iPad for virtual services? If so I will contact the patient and offer this service so they can be seen.

## 2024-02-03 ENCOUNTER — Ambulatory Visit (INDEPENDENT_AMBULATORY_CARE_PROVIDER_SITE_OTHER)

## 2024-02-03 ENCOUNTER — Ambulatory Visit: Admitting: Podiatry

## 2024-02-03 ENCOUNTER — Encounter: Payer: Self-pay | Admitting: Podiatry

## 2024-02-03 DIAGNOSIS — M79605 Pain in left leg: Secondary | ICD-10-CM | POA: Diagnosis not present

## 2024-02-03 DIAGNOSIS — R600 Localized edema: Secondary | ICD-10-CM

## 2024-02-03 DIAGNOSIS — M7752 Other enthesopathy of left foot: Secondary | ICD-10-CM | POA: Diagnosis not present

## 2024-02-03 DIAGNOSIS — M779 Enthesopathy, unspecified: Secondary | ICD-10-CM

## 2024-02-03 NOTE — Progress Notes (Signed)
  Subjective:  Patient ID: Christy Price, female    DOB: [birth year],   MRN: [id number]  Chief Complaint  Patient presents with   Foot Pain    When I walk, I get swelling in my ankle.  I also have a vein on my leg that pinches.  It feels like a burning sensation.    53 y.o. female presents for concern of left ankle swelling and pain in her calf.  She relates swelling on the right as well but not painful.  This has been ongoing for several weeks.  Referred here by her PCP for further evaluation.. Denies any other pedal complaints. Denies n/v/f/c.   Past Medical History:  Diagnosis Date   Allergy    seasonal   Anxiety    Arthritis    shoulder,knees,feet   Back spasm    Cancer (HCC)     hx cervical cx    Depression    GERD (gastroesophageal reflux disease)    Hypertension    Meningitis    Seizures (HCC)    greater than 25 years ago    Objective:  Physical Exam: Vascular: DP/PT pulses 2/4 bilateral. CFT <3 seconds. Normal hair growth on digits.  Edema noted to bilateral lower extremities worse on the left.  Spider veins noted distal calf area tracking proximally. Skin. No lacerations or abrasions bilateral feet.  Musculoskeletal: MMT 5/5 bilateral lower extremities in DF, PF, Inversion and Eversion. Deceased ROM in DF of ankle joint.  Tender to posterior calf on the left.  No pain with dorsiflexion of the leg noted.  No erythema noted. Neurological: Sensation intact to light touch.   Assessment:   1. Pain of left lower extremity   2. Localized edema      Plan:  Patient was evaluated and treated and all questions answered. X-rays reviewed and discussed with patient. No acute fractures or dislocations noted. Spurring noted to posterior calcaneus on left.  - Discussed swelling and edema in the legs and vascular concerns as etiology and treatment options for patient. -Advised on wearing compression stockings patient refuses to wear these. - Would like to rule out DVT as  pain to the back of the calf.  DVT ultrasound ordered. - Discussed depending on results likely will send to vascular and vein specialist. Return as needed.   Asberry Failing, DPM

## 2024-02-04 ENCOUNTER — Ambulatory Visit (HOSPITAL_COMMUNITY)
Admission: RE | Admit: 2024-02-04 | Discharge: 2024-02-04 | Disposition: A | Discharge status: Home / Self Care | Source: Ambulatory Visit | Attending: Podiatry | Admitting: Podiatry

## 2024-02-04 DIAGNOSIS — R600 Localized edema: Secondary | ICD-10-CM | POA: Diagnosis not present

## 2024-02-10 ENCOUNTER — Ambulatory Visit: Payer: Self-pay | Admitting: Podiatry

## 2024-02-10 DIAGNOSIS — L97919 Non-pressure chronic ulcer of unspecified part of right lower leg with unspecified severity: Secondary | ICD-10-CM

## 2024-02-15 ENCOUNTER — Other Ambulatory Visit: Payer: Self-pay | Admitting: Nurse Practitioner

## 2024-02-15 DIAGNOSIS — I10 Essential (primary) hypertension: Secondary | ICD-10-CM

## 2024-02-15 DIAGNOSIS — R600 Localized edema: Secondary | ICD-10-CM

## 2024-03-23 ENCOUNTER — Other Ambulatory Visit: Payer: Self-pay | Admitting: Nurse Practitioner

## 2024-03-23 NOTE — Telephone Encounter (Signed)
 Please advise [facility]

## 2024-03-30 ENCOUNTER — Other Ambulatory Visit: Payer: Self-pay

## 2024-07-01 ENCOUNTER — Ambulatory Visit (HOSPITAL_COMMUNITY)

## 2024-07-01 ENCOUNTER — Encounter: Admitting: Vascular Surgery

## 2024-07-19 ENCOUNTER — Ambulatory Visit: Payer: Self-pay | Admitting: Nurse Practitioner
